# Patient Record
Sex: Male | Born: 1955 | Race: Black or African American | Hispanic: No | Marital: Married | Smoking: Never smoker
Health system: Southern US, Community
[De-identification: ages and names within clinical notes are randomized; demographics above are authoritative.]

## PROBLEM LIST (undated history)

## (undated) DIAGNOSIS — Z9289 Personal history of other medical treatment: Secondary | ICD-10-CM

## (undated) DIAGNOSIS — E785 Hyperlipidemia, unspecified: Secondary | ICD-10-CM

## (undated) DIAGNOSIS — K219 Gastro-esophageal reflux disease without esophagitis: Secondary | ICD-10-CM

## (undated) DIAGNOSIS — R9431 Abnormal electrocardiogram [ECG] [EKG]: Secondary | ICD-10-CM

## (undated) DIAGNOSIS — T7840XA Allergy, unspecified, initial encounter: Secondary | ICD-10-CM

## (undated) DIAGNOSIS — I10 Essential (primary) hypertension: Secondary | ICD-10-CM

## (undated) DIAGNOSIS — M1990 Unspecified osteoarthritis, unspecified site: Secondary | ICD-10-CM

## (undated) HISTORY — DX: Hyperlipidemia, unspecified: E78.5

## (undated) HISTORY — PX: WISDOM TOOTH EXTRACTION: SHX21

## (undated) HISTORY — DX: Essential (primary) hypertension: I10

## (undated) HISTORY — DX: Unspecified osteoarthritis, unspecified site: M19.90

## (undated) HISTORY — DX: Abnormal electrocardiogram (ECG) (EKG): R94.31

## (undated) HISTORY — DX: Allergy, unspecified, initial encounter: T78.40XA

## (undated) HISTORY — PX: NO PAST SURGERIES: SHX2092

## (undated) HISTORY — DX: Personal history of other medical treatment: Z92.89

## (undated) HISTORY — DX: Gastro-esophageal reflux disease without esophagitis: K21.9

---

## 2006-10-23 ENCOUNTER — Emergency Department (HOSPITAL_COMMUNITY): Admission: EM | Admit: 2006-10-23 | Discharge: 2006-10-23 | Payer: Self-pay | Admitting: Emergency Medicine

## 2012-06-16 ENCOUNTER — Other Ambulatory Visit: Payer: Self-pay | Admitting: Nephrology

## 2012-06-20 ENCOUNTER — Ambulatory Visit
Admission: RE | Admit: 2012-06-20 | Discharge: 2012-06-20 | Disposition: A | Discharge status: Home / Self Care | Payer: BC Managed Care – PPO | Source: Ambulatory Visit | Attending: Nephrology | Admitting: Nephrology

## 2012-06-28 ENCOUNTER — Ambulatory Visit (INDEPENDENT_AMBULATORY_CARE_PROVIDER_SITE_OTHER): Payer: BC Managed Care – PPO | Admitting: Family Medicine

## 2012-06-28 VITALS — BP 121/74 | HR 85 | Temp 98.3°F | Resp 16 | Ht 69.58 in | Wt 235.2 lb

## 2012-06-28 DIAGNOSIS — H60399 Other infective otitis externa, unspecified ear: Secondary | ICD-10-CM

## 2012-06-28 DIAGNOSIS — R471 Dysarthria and anarthria: Secondary | ICD-10-CM

## 2012-06-28 DIAGNOSIS — K140 Glossitis: Secondary | ICD-10-CM

## 2012-06-28 DIAGNOSIS — H6090 Unspecified otitis externa, unspecified ear: Secondary | ICD-10-CM

## 2012-06-28 LAB — POCT CBC
MCH, POC: 28.5 pg (ref 27–31.2)
MCV: 92.1 fL (ref 80–97)
MID (cbc): 0.4 (ref 0–0.9)
Platelet Count, POC: 220 10*3/uL (ref 142–424)
RBC: 4.88 M/uL (ref 4.69–6.13)
WBC: 4.9 10*3/uL (ref 4.6–10.2)

## 2012-06-28 MED ORDER — VALACYCLOVIR HCL 1 G PO TABS: 1000.0000 mg | ORAL_TABLET | Freq: Three times a day (TID) | ORAL | Status: DC

## 2012-06-28 MED ORDER — NEOMYCIN-POLYMYXIN-HC 3.5-10000-1 OT SOLN: 3.0000 [drp] | Freq: Four times a day (QID) | OTIC | Status: DC

## 2012-06-28 NOTE — Progress Notes (Signed)
Subjective:    Patient ID: Wesley Lawson, male    DOB: [birth year], 57 y.o.   MRN: [id number] Chief Complaint  Patient presents with  . Edema    x 5 days edema  tongue  left sided     HPI  On fri - 5d prev - pt noticed that left side of tongue was swollen and sore, feels like tongue won't move right and mouth dry.  Difficult to describe - swelling mild, pain from radiating front to base of left side of tongue.  Hard to get all of words out.  Left lower face does feel different but feels like it is stemming from tongue.  Did try something from energy about a wk ago once. occasionally takes ginsing.  No other new meds or supplements. Is taking lisinopril - pill looked different but he checked with both his doctor and his pharmacist and the supplier had just changed.  Did miss a few days of it while he was confirming that it was the right pill about 2 wks ago Sees Dr. Arletha Grippe at [facility] and Dr. Lowell Guitar - nephrology as was getting some renal failure from HTN.     Had dental appt a few mos ago. Did have a little cough/congestion recently.  Past Medical History  Diagnosis Date  . Hypertension   . Chest pain   . GERD (gastroesophageal reflux disease)   . Dyspnea   . Dyslipidemia   . Abnormal EKG    Current Outpatient Prescriptions on File Prior to Visit  Medication Sig Dispense Refill  . LISINOPRIL PO Take by mouth.      . Multiple Vitamin (MULTI-VITAMIN PO) Take by mouth.       No Known Allergies   Review of Systems  Constitutional: Positive for fatigue. Negative for fever, chills, diaphoresis, activity change, appetite change and unexpected weight change.  HENT: Positive for ear pain, congestion, rhinorrhea, mouth sores, dental problem and voice change. Negative for sore throat, sneezing, drooling, neck pain, neck stiffness, postnasal drip, sinus pressure and ear discharge.   Respiratory: Positive for cough. Negative for choking, chest tightness, shortness of breath, wheezing and stridor.     Skin: Negative for color change, pallor and rash.  Neurological: Positive for speech difficulty and numbness.  Psychiatric/Behavioral: Negative for behavioral problems, confusion and decreased concentration.      BP 121/74  Pulse 85  Temp 98.3 F (36.8 C) (Oral)  Resp 16  Ht 5' 9.58" (1.767 m)  Wt 235 lb 3.2 oz (106.686 kg)  BMI 34.16 kg/m2  SpO2 98% Objective:   Physical Exam  Constitutional: He is oriented to person, place, and time. He appears well-developed and well-nourished. No distress.  HENT:  Head: Normocephalic and atraumatic. No trismus in the jaw.  Right Ear: Tympanic membrane, external ear and ear canal normal.  Left Ear: Tympanic membrane, external ear and ear canal normal.  Nose: Nose normal.  Mouth/Throat: Uvula is midline, oropharynx is clear and moist and mucous membranes are normal. Mucous membranes are not pale and not dry. Oral lesions present. Abnormal dentition. No dental abscesses or uvula swelling. No oropharyngeal exudate, posterior oropharyngeal edema, posterior oropharyngeal erythema or tonsillar abscesses.       Bilateral canals with thick white purulent exudate diffusely along walls. No erythema or tenderness.  On left lateral side of tongue, small pinpoint white tender ulcer. No other abnormality seen in tongue, gums, cheek.  Eyes: Conjunctivae normal are normal. No scleral icterus.  Neck: Normal range of motion.  Neck supple. No thyromegaly present.  Cardiovascular: Normal rate, regular rhythm, normal heart sounds and intact distal pulses.   Pulmonary/Chest: Effort normal and breath sounds normal. No respiratory distress.  Abdominal: Soft.  Musculoskeletal: He exhibits no edema.  Lymphadenopathy:    He has no cervical adenopathy.  Neurological: He is alert and oriented to person, place, and time. No cranial nerve deficit or sensory deficit. He exhibits normal muscle tone.  Skin: Skin is warm and dry. He is not diaphoretic. No erythema.   Psychiatric: He has a normal mood and affect. His behavior is normal.      Results for orders placed in visit on 06/28/12  POCT CBC      Component Value Range   WBC 4.9  4.6 - 10.2 K/uL   Lymph, poc 2.1  0.6 - 3.4   POC LYMPH PERCENT 42.0  10 - 50 %L   MID (cbc) 0.4  0 - 0.9   POC MID % 707 (*) 0 - 12 %M   POC Granulocyte 2.5  2 - 6.9   Granulocyte percent 50.3  37 - 80 %G   RBC 4.88  4.69 - 6.13 M/uL   Hemoglobin 13.9 (*) 14.1 - 18.1 g/dL   HCT, POC 16.1  09.6 - 53.7 %   MCV 92.1  80 - 97 fL   MCH, POC 28.5  27 - 31.2 pg   MCHC 31.0 (*) 31.8 - 35.4 g/dL   RDW, POC 04.5     Platelet Count, POC 220  142 - 424 K/uL   MPV 10.5  0 - 99.8 fL  POCT SEDIMENTATION RATE      Component Value Range   POCT SED RATE 12  0 - 22 mm/hr    Assessment & Plan:   1. Tongue ulceration  POCT CBC, POCT SEDIMENTATION RATE, valACYclovir (VALTREX) 1000 MG tablet - unsure of cause - will try emperic antiviral in dosing for bell's palsy or shingles.  If no improvement or worsens, consider further testing - hiv, cmp, tsh, crp, cons lyme disease or further viral testing.  Rec pt start a vitamin B complex supp though MCV not elev as would suspect in pernicious anemia/b12 def. If still having any sxs in 1 wk - f/u for further eval and ENT referral.  Cons dental eval.  2. Dysarthria    3. Otitis externa  neomycin-polymyxin-hydrocortisone (CORTISPORIN) otic solution

## 2012-06-28 NOTE — Patient Instructions (Addendum)
Start a vitamin B complex supp. If things worsen at all or you develop further symptoms or haven't resolved after you finish the valtrex make sure you return to clinic.

## 2012-07-31 ENCOUNTER — Emergency Department (HOSPITAL_COMMUNITY): Payer: BC Managed Care – PPO

## 2012-07-31 ENCOUNTER — Encounter (HOSPITAL_COMMUNITY): Payer: Self-pay | Admitting: *Deleted

## 2012-07-31 ENCOUNTER — Emergency Department (HOSPITAL_COMMUNITY)
Admission: EM | Admit: 2012-07-31 | Discharge: 2012-07-31 | Disposition: A | Discharge status: Home / Self Care | Payer: BC Managed Care – PPO | Attending: Emergency Medicine | Admitting: Emergency Medicine

## 2012-07-31 DIAGNOSIS — R42 Dizziness and giddiness: Secondary | ICD-10-CM

## 2012-07-31 DIAGNOSIS — I10 Essential (primary) hypertension: Secondary | ICD-10-CM | POA: Insufficient documentation

## 2012-07-31 DIAGNOSIS — Z8719 Personal history of other diseases of the digestive system: Secondary | ICD-10-CM | POA: Insufficient documentation

## 2012-07-31 DIAGNOSIS — Z8639 Personal history of other endocrine, nutritional and metabolic disease: Secondary | ICD-10-CM | POA: Insufficient documentation

## 2012-07-31 DIAGNOSIS — Z79899 Other long term (current) drug therapy: Secondary | ICD-10-CM | POA: Insufficient documentation

## 2012-07-31 DIAGNOSIS — Z862 Personal history of diseases of the blood and blood-forming organs and certain disorders involving the immune mechanism: Secondary | ICD-10-CM | POA: Insufficient documentation

## 2012-07-31 LAB — CBC WITH DIFFERENTIAL/PLATELET
Eosinophils Absolute: 0.1 10*3/uL (ref 0.0–0.7)
Eosinophils Relative: 1 % (ref 0–5)
Hemoglobin: 14.8 g/dL (ref 13.0–17.0)
Lymphs Abs: 1.3 10*3/uL (ref 0.7–4.0)
MCH: 29.7 pg (ref 26.0–34.0)
MCV: 89 fL (ref 78.0–100.0)
Monocytes Absolute: 0.5 10*3/uL (ref 0.1–1.0)
Monocytes Relative: 5 % (ref 3–12)
RBC: 4.98 MIL/uL (ref 4.22–5.81)

## 2012-07-31 LAB — COMPREHENSIVE METABOLIC PANEL
BUN: 15 mg/dL (ref 6–23)
Calcium: 9.2 mg/dL (ref 8.4–10.5)
GFR calc Af Amer: 76 mL/min — ABNORMAL LOW (ref >90)
Glucose, Bld: 183 mg/dL — ABNORMAL HIGH (ref 70–99)
Total Protein: 7.9 g/dL (ref 6.0–8.3)

## 2012-07-31 LAB — TROPONIN I: Troponin I: <0.3 ng/mL (ref <0.30)

## 2012-07-31 MED ORDER — MECLIZINE HCL 25 MG PO TABS: 25.0000 mg | ORAL_TABLET | Freq: Three times a day (TID) | ORAL | Status: DC | PRN

## 2012-07-31 MED ORDER — MECLIZINE HCL 25 MG PO TABS
25.0000 mg | ORAL_TABLET | Freq: Once | ORAL | Status: AC
  Administered 2012-07-31: 25 mg via ORAL
  Filled 2012-07-31: qty 1

## 2012-07-31 NOTE — ED Notes (Signed)
Pt states the last time he eat was 730 pm

## 2012-07-31 NOTE — ED Notes (Addendum)
Per EMS: pt coming from home with c/o n/v, dizziness since midnight. Per EMS pt has output of close to a liter of emsis. Upon EMS arrival pt was cool and clammy, positive for nystagmus, dizzy. No neuro deficits. Pt is A&Ox4, respirations equal and unlabored, skin warm and dry. Pt given 8mg  of Zofran en route by EMS

## 2012-07-31 NOTE — ED Provider Notes (Signed)
History     CSN: [id number]  Arrival date & time 07/31/12  0137   First MD Initiated Contact with Patient 07/31/12 0211      Chief Complaint  Patient presents with  . Nausea  . Emesis    (Consider location/radiation/quality/duration/timing/severity/associated sxs/prior treatment) HPI Comments: Patient was here visiting a family member who had a stroke.  While here he started with dizziness (spinning sensation) and n/v, but no diarrhea.  The dizziness is worse with movement and position, relieved with rest.  No headache, fever, or chills.  No other ill contacts.    Patient is a 57 y.o. male presenting with vomiting. The history is provided by the patient.  Emesis Severity:  Moderate Duration:  4 hours Timing:  Constant Quality:  Stomach contents Progression:  Worsening Chronicity:  New Recent urination:  Normal   Past Medical History  Diagnosis Date  . Hypertension   . Chest pain   . GERD (gastroesophageal reflux disease)   . Dyspnea   . Dyslipidemia   . Abnormal EKG     History reviewed. No pertinent past surgical history.  Family History  Problem Relation Age of Onset  . Diabetes Mother   . Stroke Mother   . Diabetes Sister     History  Substance Use Topics  . Smoking status: Never Smoker   . Smokeless tobacco: Not on file  . Alcohol Use: 0.6 oz/week    1 Cans of beer per week      Review of Systems  Gastrointestinal: Positive for vomiting.  All other systems reviewed and are negative.    Allergies  Review of patient's allergies indicates no known allergies.  Home Medications   Current Outpatient Rx  Name  Route  Sig  Dispense  Refill  . LISINOPRIL PO   Oral   Take by mouth.         . Multiple Vitamin (MULTI-VITAMIN PO)   Oral   Take by mouth.         . neomycin-polymyxin-hydrocortisone (CORTISPORIN) otic solution   Both Ears   Place 3 drops into both ears 4 (four) times daily.   10 mL   0   . valACYclovir (VALTREX) 1000 MG  tablet   Oral   Take 1 tablet (1,000 mg total) by mouth 3 (three) times daily.   21 tablet   0     BP 175/99  Pulse 83  Temp(Src) 97.9 F (36.6 C) (Oral)  Resp 19  SpO2 98%  Physical Exam  Nursing note and vitals reviewed. Constitutional: He is oriented to person, place, and time. He appears well-developed and well-nourished. No distress.  HENT:  Head: Normocephalic and atraumatic.  Mouth/Throat: Oropharynx is clear and moist.  Eyes: EOM are normal. Pupils are equal, round, and reactive to light.  Neck: Normal range of motion. Neck supple.  Cardiovascular: Normal rate and regular rhythm.   No murmur heard. Pulmonary/Chest: Effort normal and breath sounds normal. No respiratory distress. He has no wheezes.  Abdominal: Soft. Bowel sounds are normal. He exhibits no distension. There is no tenderness.  Musculoskeletal: Normal range of motion. He exhibits no edema.  Lymphadenopathy:    He has no cervical adenopathy.  Neurological: He is alert and oriented to person, place, and time. No cranial nerve deficit. He exhibits normal muscle tone. Coordination normal.  Skin: Skin is warm and dry. He is not diaphoretic.    ED Course  Procedures (including critical care time)  Labs Reviewed  CBC WITH  DIFFERENTIAL  COMPREHENSIVE METABOLIC PANEL  TROPONIN I   No results found.   No diagnosis found.   Date: 07/31/2012  Rate: 81  Rhythm: normal sinus rhythm  QRS Axis: normal  Intervals: normal  ST/T Wave abnormalities: nonspecific T wave changes  Conduction Disutrbances:none  Narrative Interpretation:   Old EKG Reviewed: unchanged    MDM  The patient presents with symptoms that are consistent with a peripheral vertigo.  The workup does not reveal a stroke, cardiac etiology, or other laboratory abnormality that would explain this.  He is feeling better with meclizine and I believe is appropriate for discharge.  To return prn.        Geoffery Lyons, MD 07/31/12 [phone]

## 2013-04-01 ENCOUNTER — Ambulatory Visit (INDEPENDENT_AMBULATORY_CARE_PROVIDER_SITE_OTHER): Payer: BC Managed Care – PPO | Admitting: Family Medicine

## 2013-04-01 VITALS — BP 128/80 | HR 72 | Temp 98.3°F | Resp 14 | Ht 71.0 in | Wt 233.0 lb

## 2013-04-01 DIAGNOSIS — J029 Acute pharyngitis, unspecified: Secondary | ICD-10-CM

## 2013-04-01 DIAGNOSIS — Z23 Encounter for immunization: Secondary | ICD-10-CM

## 2013-04-01 DIAGNOSIS — R079 Chest pain, unspecified: Secondary | ICD-10-CM

## 2013-04-01 NOTE — Patient Instructions (Signed)
Viral Pharyngitis Viral pharyngitis is a viral infection that produces redness, pain, and swelling (inflammation) of the throat. It can spread from person to person (contagious). CAUSES Viral pharyngitis is caused by inhaling a large amount of certain germs called viruses. Many different viruses cause viral pharyngitis. SYMPTOMS Symptoms of viral pharyngitis include:  Sore throat.  Tiredness.  Stuffy nose.  Low-grade fever.  Congestion.  Cough. TREATMENT Treatment includes rest, drinking plenty of fluids, and the use of over-the-counter medication (approved by your caregiver). HOME CARE INSTRUCTIONS   Drink enough fluids to keep your urine clear or pale yellow.  Eat soft, cold foods such as ice cream, frozen ice pops, or gelatin dessert.  Gargle with warm salt water (1 tsp salt per 1 qt of water).  If over age 7, throat lozenges may be used safely.  Only take over-the-counter or prescription medicines for pain, discomfort, or fever as directed by your caregiver. Do not take aspirin. To help prevent spreading viral pharyngitis to others, avoid:  Mouth-to-mouth contact with others.  Sharing utensils for eating and drinking.  Coughing around others. SEEK MEDICAL CARE IF:   You are better in a few days, then become worse.  You have a fever or pain not helped by pain medicines.  There are any other changes that concern you. Document Released: 03/10/2005 Document Revised: 08/23/2011 Document Reviewed: 08/06/2010 Exit[employer] Patient Information 2014 [employer].  

## 2013-04-01 NOTE — Progress Notes (Signed)
  Subjective:    Patient ID: Wesley Lawson, male    DOB: 1956/03/18, 57 y.o.   MRN: [id number]  HPI Throat scratchy for a couple of days. Started hurting more this morning, from ear to throat. Mild headache. No nasal drainage, no cough. No SOB. No fever, no chills. Slept ok last night. Upper chest started aching this morning. Not too bad, but getting worse. Occasionally has acid indigestion.  No nausea, no vomiting, no diarrhea.  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   Sees [employer], at [place]. Was there recently and had labs done. Has regular follow up. Had negative stress test in past.  Review of Systems  Constitutional: Positive for fatigue. Negative for fever and chills.       Fatigued last couple of months.   HENT: Positive for ear pain. Negative for congestion, postnasal drip, rhinorrhea and sinus pressure.        Pain at base of left ear into throat.  Respiratory: Negative for shortness of breath.   Cardiovascular: Positive for chest pain. Negative for palpitations and leg swelling.  Gastrointestinal: Negative for nausea, vomiting, diarrhea and constipation.  Allergic/Immunologic: Negative for environmental allergies.  Neurological: Negative for headaches.       Objective:   Physical Exam  Nursing note and vitals reviewed. Constitutional: He appears well-developed and well-nourished. No distress.  HENT:  Right Ear: Tympanic membrane, external ear and ear canal normal.  Left Ear: Tympanic membrane and ear canal normal.  Nose: Nose normal.  Mouth/Throat: Uvula is midline and mucous membranes are normal. Posterior oropharyngeal erythema present. No oropharyngeal exudate, posterior oropharyngeal edema or tonsillar abscesses.  Cardiovascular: Normal rate, regular rhythm and normal heart sounds.   Pulmonary/Chest: Effort normal and breath sounds normal. He exhibits tenderness.  Slightly tender over anterior left sternal border.  Abdominal: Soft.  Bowel sounds are normal.  Skin: He is not diaphoretic.   Results for orders placed in visit on 04/01/13  POCT RAPID STREP A (OFFICE)      Result Value Range   Rapid Strep A Screen Negative  Negative       Assessment & Plan:  Acute pharyngitis - Plan: POCT rapid strep A, Culture, Group A Strep  Chest pain - Plan: EKG 12-Lead  Need for prophylactic vaccination and inoculation against influenza - Plan: Flu Vaccine QUAD 36+ mos IM  Gave written and verbal information about pharyngitis. Will notify of culture results. Return if worsening symptoms, fever, SP, SOB.

## 2013-04-04 LAB — CULTURE, GROUP A STREP: Organism ID, Bacteria: NORMAL

## 2013-08-30 NOTE — Progress Notes (Signed)
History and physical exams obtained with [facility].  Labs and EKG reviewed.  A/P:  1.  Acute pharyngitis: New.  Send throat culture; supportive care with rest, fluids, Ibuprofen or Tylenol. Consistent with viral syndrome.  2.  Chest pain:  New.  Consistent with chest wall strain; pain is reproducible with palpation.  3.  Abnormal EKG: unchanged; EKG abnormal yet unchanged from previous tracing.  No exertional component to chest pain at this time.  Recommend follow-up with PCP if persists.  4.  S/p flu vaccine.

## 2014-02-07 ENCOUNTER — Ambulatory Visit (INDEPENDENT_AMBULATORY_CARE_PROVIDER_SITE_OTHER): Payer: BC Managed Care – PPO | Admitting: Sports Medicine

## 2014-02-07 ENCOUNTER — Encounter: Payer: Self-pay | Admitting: Sports Medicine

## 2014-02-07 VITALS — BP 138/79 | HR 75 | Ht 70.0 in | Wt 230.0 lb

## 2014-02-07 DIAGNOSIS — R209 Unspecified disturbances of skin sensation: Secondary | ICD-10-CM

## 2014-02-07 DIAGNOSIS — R200 Anesthesia of skin: Secondary | ICD-10-CM | POA: Insufficient documentation

## 2014-02-07 DIAGNOSIS — M79644 Pain in right finger(s): Secondary | ICD-10-CM

## 2014-02-07 DIAGNOSIS — M79609 Pain in unspecified limb: Secondary | ICD-10-CM

## 2014-02-07 MED ORDER — PREDNISONE (PAK) 10 MG PO TABS: ORAL_TABLET | ORAL | Status: DC
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
## 2014-02-07 NOTE — Assessment & Plan Note (Signed)
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 Again given this is acute onset and the decision to treat his inflammation in his left arm with prednisone we'll reassess if this intervention resolve some of his inflammation over the next couple weeks. The patient follows up we'll also address his hip pain.

## 2014-02-07 NOTE — Assessment & Plan Note (Signed)
At this time I cannot differentiate the exact location that initiated patient's neuropathic pain in his hand with numbness. No clear signs of nerve impingement at the carpal tunnel or partial condyle. There is possibility that he could have some impingement in his neck given his history of facet narrowing at the C6-C7 region seen on previous x-rays. At this time because of the relatively acute onset of symptoms I recommended treating this with a prednisone dose pack to reduce inflammation and see if this will calm down his discomfort in his hand. The patient consistently has symptoms of numbness and tingling those are resolving after another 3-4 weeks will recommend referring him for a EMG to classify if the neuropathic type of pain is coming from his neck versus more peripheral location

## 2014-02-21 ENCOUNTER — Encounter: Payer: Self-pay | Admitting: Sports Medicine

## 2014-02-21 ENCOUNTER — Ambulatory Visit
Admission: RE | Admit: 2014-02-21 | Discharge: 2014-02-21 | Disposition: A | Discharge status: Home / Self Care | Payer: BC Managed Care – PPO | Source: Ambulatory Visit | Attending: Sports Medicine | Admitting: Sports Medicine

## 2014-02-21 ENCOUNTER — Ambulatory Visit (INDEPENDENT_AMBULATORY_CARE_PROVIDER_SITE_OTHER): Payer: BC Managed Care – PPO | Admitting: Sports Medicine

## 2014-02-21 VITALS — BP 116/76 | HR 81 | Ht 70.0 in | Wt 230.0 lb

## 2014-02-21 DIAGNOSIS — M79644 Pain in right finger(s): Secondary | ICD-10-CM

## 2014-02-21 DIAGNOSIS — M25559 Pain in unspecified hip: Secondary | ICD-10-CM | POA: Diagnosis not present

## 2014-02-21 DIAGNOSIS — M79609 Pain in unspecified limb: Secondary | ICD-10-CM

## 2014-02-21 DIAGNOSIS — M25551 Pain in right hip: Secondary | ICD-10-CM | POA: Insufficient documentation

## 2014-02-21 NOTE — Progress Notes (Signed)
  Wesley Lawson - 58 y.o. male MRN [id number]  Date of birth: [birth year]  SUBJECTIVE:  Including CC & ROS.  Patient is a 58 year old African American male presents for f/u with multiple complaints including, right thumb pain, right lateral hip and and resolution of the left hand numbness and tingling.  Left hand numbness: The patient's last visit approximately 3 weeks ago he is treated with a course of oral prednisone to help with his multi-areas of inflammation. Patient reports resolution of the neuropathic type pain in his left hand with no longer having sensations of numbness. Not had any problems with weakness and having no pain at night.  Right thumb:Patient reports some improvement in the aching sensation of his right thumb at the first MCP joint from the prednisone treatment. However the pain has returned and continues to have intermittent throbbing. I suspected at his last visit this is from first MCP joint arthritis.Again denies any weakness. Denies any numbness or tingling of his hand.  Right lateral hip pain: Patient localizes persistent discomfort over the right lateral hip essentially between the iliac crest down into his greater trochanter along the abductor and external rotating muscle bellies. He has no groin pain no low back pain. Again he found some clinical improvement from the prednisone but no complete resolution of his symptoms. Denies any numbness tingling in the legs or radiation of this pain.  All 3 of these issues are slightly improved with taking Tylenol over-the-counter. Patient has not engaging in any bracing, physical therapy activities for relief.   ROS: Review of systems otherwise negative except for information present in HPI  HISTORY: Past Medical, Surgical, Social, and Family History Reviewed & Updated per EMR. Pertinent Historical Findings include: History of HTN, no tobacco abuse  DATA REVIEWED: Was able to review cervical spine x-rays from 2008 it did show some  mild disc space narrowing and endplate spurring. As well as slight encroachment upon bilateral cervical nuclear foramen at C6/C7 secondary to spurring and cervical thoracic scoliosis.  PHYSICAL EXAM:  VS: BP:116/76 mmHg  HR:81bpm  TEMP: ( )  RESP:   HT:5\' 10"  (177.8 cm)   WT:230 lb (104.327 kg)  BMI:33.1 General: well nourished Skin of UE: warm; dry, no rashes, lesions, ecchymosis or erythema. Vascular: radial pulses 2+ bilaterally HAND EXAM:       Observation: no palmar or dorsal hand edema, no swelling, no erythema, no bruising Palpation:Right hand patient had some mild tenderness to palpation over the first MCP joint, and tenderness at the metacarpal-trapezium joint. No pain over the tendinous junction of the first or second extensor tunnels.    ROM: normal ROM in supination and pronation, elbow extension and flexion    Strength: Normal 5/5 strength with extension/ flexion, pronation and supination.     No pain 5/5 strength of each digit in flexion and extension   Special test: stable medial and lateral collateral ligaments of the thumb on the right hand.   HIP EXAM:  Palpation:  TTP pain over the iliac crest,  With tenderness of the lateral greater trochanter and bursa No PSIS tenderness or SI joint pain ROM: Normal Hip motion in flexion, extension, internal and external rotation Muscle strength:Patient has significant muscle weakness of his gluteus medius and gluteus maximus. Along with decreased strength with external rotation of the hip. Approximately 4/5 compared to the left. Positive Trendelenburg's on the right  ASSESSMENT & PLAN: See problem based charting & AVS for pt instructions.

## 2014-02-21 NOTE — Assessment & Plan Note (Signed)
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 Recommend proceeding with right hand x-rays to evaluate severity of his arthritis. After a waiting his x-rays will call to discuss treatment options of possible intra-articular injection

## 2014-02-21 NOTE — Assessment & Plan Note (Signed)
Patient has a persistent lateral hip discomfort despite aggressive anti-inflammatory treatment. Suspect this is related to muscle weakness of the hip external rotators and abductors. Recommend a referral to physical therapy to work on hip strengthening and pelvic stabilization

## 2014-02-26 ENCOUNTER — Telehealth: Payer: Self-pay | Admitting: Sports Medicine

## 2014-02-26 NOTE — Telephone Encounter (Signed)
Patient voicemail to call back for his x-ray results on Thursday afternoon or Friday morning when I'm back in office

## 2014-02-28 ENCOUNTER — Other Ambulatory Visit: Payer: Self-pay | Admitting: *Deleted

## 2014-02-28 DIAGNOSIS — M25551 Pain in right hip: Secondary | ICD-10-CM

## 2014-02-28 MED ORDER — MELOXICAM 15 MG PO TABS: 15.0000 mg | ORAL_TABLET | Freq: Every day | ORAL | Status: DC | PRN

## 2014-02-28 NOTE — Addendum Note (Signed)
Addended by: Louie Casa on: 02/28/2014 12:39 PM   Modules accepted: Orders

## 2014-02-28 NOTE — Telephone Encounter (Signed)
Spoke with patient over the phone concerning his right common x-ray results revealing only mild degenerative changes. Discussed options of using a thumb spica brace to calm down the inflammation and prevent excessive motion in this joint to see this reduces his inflammation. Patient was agreeable this plan and also asked for a potential anti-inflammatory medicine he can take daily. Told him I would send a prescription for Mobic.  Concerning his low back pain patient is onset of this appointment for physical therapy at this time. We'll coordinate with my office to make sure the patient has a appointment set up in the near future.

## 2014-03-07 ENCOUNTER — Ambulatory Visit: Payer: BC Managed Care – PPO | Attending: Family Medicine

## 2014-03-07 DIAGNOSIS — R269 Unspecified abnormalities of gait and mobility: Secondary | ICD-10-CM | POA: Diagnosis not present

## 2014-03-07 DIAGNOSIS — M25559 Pain in unspecified hip: Secondary | ICD-10-CM | POA: Diagnosis not present

## 2014-03-07 DIAGNOSIS — IMO0001 Reserved for inherently not codable concepts without codable children: Secondary | ICD-10-CM | POA: Diagnosis not present

## 2014-03-19 ENCOUNTER — Encounter: Payer: BC Managed Care – PPO | Admitting: Physical Therapy

## 2014-03-21 ENCOUNTER — Ambulatory Visit: Payer: BC Managed Care – PPO | Attending: Family Medicine | Admitting: Physical Therapy

## 2014-03-21 DIAGNOSIS — Z5189 Encounter for other specified aftercare: Secondary | ICD-10-CM | POA: Insufficient documentation

## 2014-03-21 DIAGNOSIS — R269 Unspecified abnormalities of gait and mobility: Secondary | ICD-10-CM | POA: Diagnosis not present

## 2014-03-21 DIAGNOSIS — M25551 Pain in right hip: Secondary | ICD-10-CM | POA: Diagnosis not present

## 2014-03-25 ENCOUNTER — Ambulatory Visit: Payer: BC Managed Care – PPO | Admitting: Physical Therapy

## 2015-02-17 ENCOUNTER — Emergency Department (INDEPENDENT_AMBULATORY_CARE_PROVIDER_SITE_OTHER)
Admission: EM | Admit: 2015-02-17 | Discharge: 2015-02-17 | Disposition: A | Discharge status: Home / Self Care | Payer: BC Managed Care – PPO | Source: Home / Self Care | Attending: Family Medicine | Admitting: Family Medicine

## 2015-02-17 ENCOUNTER — Encounter (HOSPITAL_COMMUNITY): Payer: Self-pay | Admitting: Emergency Medicine

## 2015-02-17 DIAGNOSIS — H6123 Impacted cerumen, bilateral: Secondary | ICD-10-CM | POA: Diagnosis not present

## 2015-02-17 NOTE — ED Notes (Signed)
C/o cerumen impaction bilateral otc ear med used as tx

## 2015-02-17 NOTE — Discharge Instructions (Signed)
Your ears were cleaned today. Please use a one-to-one mixture of hydrogen peroxide and water in the future to help break up the ear wax in your ears. Please use ibuprofen 600 mg to 800 mg every 6-8 hours for additional pain relief.   Cerumen Impaction A cerumen impaction is when the wax in your ear forms a plug. This plug usually causes reduced hearing. Sometimes it also causes an earache or dizziness. Removing a cerumen impaction can be difficult and painful. The wax sticks to the ear canal. The canal is sensitive and bleeds easily. If you try to remove a heavy wax buildup with a cotton tipped swab, you may push it in further. Irrigation with water, suction, and small ear curettes may be used to clear out the wax. If the impaction is fixed to the skin in the ear canal, ear drops may be needed for a few days to loosen the wax. People who build up a lot of wax frequently can use ear wax removal products available in your local drugstore. SEEK MEDICAL CARE IF:  You develop an earache, increased hearing loss, or marked dizziness. Document Released: 07/08/2004 Document Revised: 08/23/2011 Document Reviewed: 08/28/2009 [facility] Patient Information 2015 [place], [place]. This information is not intended to replace advice given to you by your health care provider. Make sure you discuss any questions you have with your health care provider.

## 2015-02-17 NOTE — ED Provider Notes (Signed)
CSN: [id number]     Arrival date & time 02/17/15  1837 History   First MD Initiated Contact with Patient 02/17/15 1959     Chief Complaint  Patient presents with  . Cerumen Impaction   (Consider location/radiation/quality/duration/timing/severity/associated sxs/prior Treatment) HPI muffled sounds in ears. Patient with intermittent cerumen impaction history. States that this is been getting worse over the last 5 days. No pain. No discharge. Bilateral. Problem is constant and getting worse. Q-tips without improvement. Denies neck stiffness, fevers, headache, nausea, vomiting, dizziness, lightheadedness, LOC, URI symptoms, sore throat, chest congestion or cough.    Past Medical History  Diagnosis Date  . Hypertension   . Chest pain   . GERD (gastroesophageal reflux disease)   . Dyspnea   . Dyslipidemia   . Abnormal EKG    History reviewed. No pertinent past surgical history. Family History  Problem Relation Age of Onset  . Diabetes Mother   . Stroke Mother   . Diabetes Sister    Social History  Substance Use Topics  . Smoking status: Never Smoker   . Smokeless tobacco: None  . Alcohol Use: 0.6 oz/week    1 Cans of beer per week    Review of Systems Per HPI with all other pertinent systems negative.   Allergies  Review of patient's allergies indicates no known allergies.  Home Medications   Prior to Admission medications   Medication Sig Start Date End Date Taking? Authorizing Provider  Cyanocobalamin (VITAMIN B-12 PO) Take 1 tablet by mouth daily.    Historical Provider, MD  hydrochlorothiazide (HYDRODIURIL) 25 MG tablet Take 25 mg by mouth daily.    Historical Provider, MD  lisinopril (PRINIVIL,ZESTRIL) 20 MG tablet Take 20 mg by mouth daily.    Historical Provider, MD  meclizine (ANTIVERT) 25 MG tablet Take 1 tablet (25 mg total) by mouth 3 (three) times daily as needed. 07/31/12   Veryl Speak, MD  meloxicam (MOBIC) 15 MG tablet Take 1 tablet (15 mg total) by mouth  daily as needed for pain. 02/28/14   Deanna M Didiano, DO  Multiple Vitamin (MULTI-VITAMIN PO) Take by mouth.    Historical Provider, MD   Meds Ordered and Administered this Visit  Medications - No data to display  BP 131/83 mmHg  Pulse 86  Temp(Src) 98.5 F (36.9 C) (Oral)  Resp 16  SpO2 98% No data found.   Physical Exam Physical Exam  Constitutional: oriented to person, place, and time. appears well-developed and well-nourished. No distress.  HENT:  Bilateral cerumen impaction. Posterior irrigation TMs intact with external ear canal erythema and mild bleeding secondary to clean out no overt signs of infection. Head: Normocephalic and atraumatic.  Eyes: EOMI. PERRL.  Neck: Normal range of motion.  Cardiovascular: RRR, no m/r/g, 2+ distal pulses,  Pulmonary/Chest: Effort normal and breath sounds normal. No respiratory distress.  Abdominal: Soft. Bowel sounds are normal. NonTTP, no distension.  Musculoskeletal: Normal range of motion. Non ttp, no effusion.  Neurological: alert and oriented to person, place, and time.  Skin: Skin is warm. No rash noted. non diaphoretic.  Psychiatric: normal mood and affect. behavior is normal. Judgment and thought content normal.   ED Course  Procedures (including critical care time)  Labs Review Labs Reviewed - No data to display  Imaging Review No results found.   Visual Acuity Review  Right Eye Distance:   Left Eye Distance:   Bilateral Distance:    Right Eye Near:   Left Eye Near:  Bilateral Near:         MDM   1. Cerumen impaction, bilateral    Bilateral cerumen impaction cleared by irrigation. Motrin 600 800 mg 4 pain post clean out. No need for antibiotic systems point in time. Discussed future maintenance cleaning with peroxide and water as opposed to Q-tips.   Waldemar Dickens, MD 02/17/15 2008

## 2015-06-05 ENCOUNTER — Emergency Department (INDEPENDENT_AMBULATORY_CARE_PROVIDER_SITE_OTHER)
Admission: EM | Admit: 2015-06-05 | Discharge: 2015-06-05 | Disposition: A | Discharge status: Nursing Home (Includes ICF) | Payer: BC Managed Care – PPO | Source: Home / Self Care | Attending: Family Medicine | Admitting: Family Medicine

## 2015-06-05 ENCOUNTER — Encounter (HOSPITAL_COMMUNITY): Payer: Self-pay | Admitting: Emergency Medicine

## 2015-06-05 ENCOUNTER — Emergency Department (INDEPENDENT_AMBULATORY_CARE_PROVIDER_SITE_OTHER): Payer: BC Managed Care – PPO

## 2015-06-05 ENCOUNTER — Emergency Department (HOSPITAL_COMMUNITY)
Admission: EM | Admit: 2015-06-05 | Discharge: 2015-06-06 | Disposition: A | Discharge status: Home / Self Care | Payer: BC Managed Care – PPO | Attending: Emergency Medicine | Admitting: Emergency Medicine

## 2015-06-05 DIAGNOSIS — Z8639 Personal history of other endocrine, nutritional and metabolic disease: Secondary | ICD-10-CM | POA: Diagnosis not present

## 2015-06-05 DIAGNOSIS — Z79899 Other long term (current) drug therapy: Secondary | ICD-10-CM | POA: Insufficient documentation

## 2015-06-05 DIAGNOSIS — R000 Tachycardia, unspecified: Secondary | ICD-10-CM | POA: Diagnosis not present

## 2015-06-05 DIAGNOSIS — R079 Chest pain, unspecified: Secondary | ICD-10-CM

## 2015-06-05 DIAGNOSIS — J069 Acute upper respiratory infection, unspecified: Secondary | ICD-10-CM | POA: Diagnosis not present

## 2015-06-05 DIAGNOSIS — I10 Essential (primary) hypertension: Secondary | ICD-10-CM | POA: Diagnosis not present

## 2015-06-05 DIAGNOSIS — Z8719 Personal history of other diseases of the digestive system: Secondary | ICD-10-CM | POA: Diagnosis not present

## 2015-06-05 DIAGNOSIS — R0600 Dyspnea, unspecified: Secondary | ICD-10-CM

## 2015-06-05 DIAGNOSIS — R0602 Shortness of breath: Secondary | ICD-10-CM | POA: Diagnosis present

## 2015-06-05 LAB — CBC WITH DIFFERENTIAL/PLATELET
BASOS ABS: 0 10*3/uL (ref 0.0–0.1)
BASOS PCT: 0 %
EOS ABS: 0.1 10*3/uL (ref 0.0–0.7)
Eosinophils Relative: 1 %
HEMATOCRIT: 41.8 % (ref 39.0–52.0)
HEMOGLOBIN: 13.5 g/dL (ref 13.0–17.0)
Lymphocytes Relative: 16 %
Lymphs Abs: 1.6 10*3/uL (ref 0.7–4.0)
MCH: 29.5 pg (ref 26.0–34.0)
MCHC: 32.3 g/dL (ref 30.0–36.0)
MCV: 91.3 fL (ref 78.0–100.0)
MONO ABS: 1 10*3/uL (ref 0.1–1.0)
Monocytes Relative: 10 %
NEUTROS ABS: 7.8 10*3/uL — AB (ref 1.7–7.7)
NEUTROS PCT: 73 %
Platelets: 191 10*3/uL (ref 150–400)
RBC: 4.58 MIL/uL (ref 4.22–5.81)
RDW: 14.1 % (ref 11.5–15.5)
WBC: 10.5 10*3/uL (ref 4.0–10.5)

## 2015-06-05 LAB — BASIC METABOLIC PANEL
ANION GAP: 10 (ref 5–15)
BUN: 9 mg/dL (ref 6–20)
CALCIUM: 9.3 mg/dL (ref 8.9–10.3)
CO2: 29 mmol/L (ref 22–32)
CREATININE: 1.39 mg/dL — AB (ref 0.61–1.24)
Chloride: 99 mmol/L — ABNORMAL LOW (ref 101–111)
GFR, EST NON AFRICAN AMERICAN: 54 mL/min — AB (ref >60)
Glucose, Bld: 108 mg/dL — ABNORMAL HIGH (ref 65–99)
Potassium: 3.7 mmol/L (ref 3.5–5.1)
Sodium: 138 mmol/L (ref 135–145)

## 2015-06-05 LAB — I-STAT TROPONIN, ED
TROPONIN I, POC: 0 ng/mL (ref 0.00–0.08)
Troponin i, poc: 0.01 ng/mL (ref 0.00–0.08)

## 2015-06-05 LAB — TROPONIN I

## 2015-06-05 MED ORDER — ACETAMINOPHEN 325 MG PO TABS
650.0000 mg | ORAL_TABLET | Freq: Once | ORAL | Status: AC
  Administered 2015-06-05: 650 mg via ORAL
  Filled 2015-06-05: qty 2

## 2015-06-05 MED ORDER — SODIUM CHLORIDE 0.9 % IV SOLN
Freq: Once | INTRAVENOUS | Status: AC
  Administered 2015-06-05: 19:00:00 via INTRAVENOUS

## 2015-06-05 NOTE — ED Notes (Signed)
No carelink available

## 2015-06-05 NOTE — ED Provider Notes (Signed)
CSN: [id number]     Arrival date & time 06/05/15  1855 History   First MD Initiated Contact with Patient 06/05/15 1909     Chief Complaint  Patient presents with  . Shortness of Breath     (Consider location/radiation/quality/duration/timing/severity/associated sxs/prior Treatment) HPI  59 year old male presents with a chief complaint of shortness of breath. This started around 1 PM while he was at work. Seems to be only noticeable on exertion. He has had a cough for last 3 days and feels like his chest is congested. He was at urgent care earlier and sent here for evaluation. Urgent care notes that he states he was having chest pain and chest pressure. He tells me there is no pain or pressure, is no heaviness, but only that he feels congested in his left chest. Has been having subjective fevers with chills. Has also been feeling he's getting laryngitis with a sore throat and hoarseness.  Past Medical History  Diagnosis Date  . Hypertension   . Chest pain   . GERD (gastroesophageal reflux disease)   . Dyspnea   . Dyslipidemia   . Abnormal EKG    History reviewed. No pertinent past surgical history. Family History  Problem Relation Age of Onset  . Diabetes Mother   . Stroke Mother   . Diabetes Sister    Social History  Substance Use Topics  . Smoking status: Never Smoker   . Smokeless tobacco: None  . Alcohol Use: 0.6 oz/week    1 Cans of beer per week    Review of Systems  Constitutional: Positive for chills. Negative for fever.  HENT: Positive for congestion and voice change.   Respiratory: Positive for cough and shortness of breath.   Cardiovascular: Negative for chest pain and leg swelling.  All other systems reviewed and are negative.     Allergies  Review of patient's allergies indicates no known allergies.  Home Medications   Prior to Admission medications   Medication Sig Start Date End Date Taking? Authorizing Provider  Cyanocobalamin (VITAMIN B-12 PO)  Take 1 tablet by mouth daily.    Historical Provider, MD  hydrochlorothiazide (HYDRODIURIL) 25 MG tablet Take 25 mg by mouth daily.    Historical Provider, MD  lisinopril (PRINIVIL,ZESTRIL) 20 MG tablet Take 20 mg by mouth daily.    Historical Provider, MD  meclizine (ANTIVERT) 25 MG tablet Take 1 tablet (25 mg total) by mouth 3 (three) times daily as needed. 07/31/12   Veryl Speak, MD  meloxicam (MOBIC) 15 MG tablet Take 1 tablet (15 mg total) by mouth daily as needed for pain. 02/28/14   Deanna M Didiano, DO  Multiple Vitamin (MULTI-VITAMIN PO) Take by mouth.    Historical Provider, MD   There were no vitals taken for this visit. Physical Exam  Constitutional: He is oriented to person, place, and time. He appears well-developed and well-nourished.  HENT:  Head: Normocephalic and atraumatic.  Right Ear: External ear normal.  Left Ear: External ear normal.  Nose: Nose normal.  Eyes: Right eye exhibits no discharge. Left eye exhibits no discharge.  Neck: Neck supple.  Cardiovascular: Normal rate, regular rhythm, normal heart sounds and intact distal pulses.   Pulmonary/Chest: Effort normal and breath sounds normal. No stridor.  Abdominal: Soft. He exhibits no distension. There is no tenderness.  Musculoskeletal: He exhibits no edema.  Neurological: He is alert and oriented to person, place, and time.  Skin: Skin is warm and dry.  Nursing note and vitals reviewed.  ED Course  Procedures (including critical care time) Labs Review Labs Reviewed  BASIC METABOLIC PANEL - Abnormal; Notable for the following:    Chloride 99 (*)    Glucose, Bld 108 (*)    Creatinine, Ser 1.39 (*)    GFR calc non Af Amer 54 (*)    All other components within normal limits  CBC WITH DIFFERENTIAL/PLATELET - Abnormal; Notable for the following:    Neutro Abs 7.8 (*)    All other components within normal limits  TROPONIN I  I-STAT TROPOININ, ED  Randolm Idol, ED    Imaging Review Dg Chest 2  View  06/05/2015  CLINICAL DATA:  Chest pain.  Shortness of breath. EXAM: CHEST  2 VIEW COMPARISON:  10/23/2006 FINDINGS: The heart size and mediastinal contours are within normal limits. Both lungs are clear. The visualized skeletal structures are unremarkable. IMPRESSION: No active cardiopulmonary disease. Electronically Signed   By: Markus Daft M.D.   On: 06/05/2015 17:48   I have personally reviewed and evaluated these images and lab results as part of my medical decision-making.   EKG Interpretation   Date/Time:  Thursday June 05 2015 19:03:48 EST Ventricular Rate:  86 PR Interval:  131 QRS Duration: 87 QT Interval:  351 QTC Calculation: 420 R Axis:   53 Text Interpretation:  Sinus rhythm Borderline T wave abnormalities no  significant change since 2014 Confirmed by Amiayah Giebel  MD, [place] ([id number]) on  06/05/2015 7:10:49 PM      MDM   Final diagnoses:  Upper respiratory infection    Patient symptoms are most consistent with an upper respiratory infection. There is no chest pain despite prior urgent care what patient is telling me. Patient appears well here, no increased work of breathing. Has 2 negative troponins, very low suspicion for ACS. Symptoms are not consistent with PE. Patient did have a very brief episode of tachycardia, difficult to tell this was atrial flutter or sinus. I discussed this with patient, he did not have any symptoms during this period he will need to follow-up with his PCP for further workup and possible Holter monitoring. Discussed strict return precautions.  Sherwood Gambler, MD 06/06/15 [phone]

## 2015-06-05 NOTE — ED Notes (Signed)
Per EMS, pt from urgent care with c/o shortness of breath and weakness while at work. Pt sent from urgent care. VSS. BP- 140/90, O2- 98 RA

## 2015-06-05 NOTE — Discharge Instructions (Signed)
Your Heart Rate transiently elevated while you were in the ER. It is unclear if this is from an arrythmia or that your heart rate just spike up. Follow up closely with your regular doctor. If you develop palpitations return to the ER immediately.

## 2015-06-05 NOTE — ED Notes (Signed)
Patient complains of laryngitis, chest pain with coughing described as "hurting" pain. Reports phlegm in throat.  Reports having difficulty catching breath after performing routine activities of daily life.  Complains of headache.  Reports sniffles earlier this week.  Denies ear pain, denies fever.  Patient did not get a flu shot this year.

## 2015-06-05 NOTE — ED Notes (Signed)
Notified [facility] 

## 2015-06-05 NOTE — ED Provider Notes (Signed)
CSN: [id number]     Arrival date & time 06/05/15  1518 History   First MD Initiated Contact with Patient 06/05/15 1555     Chief Complaint  Patient presents with  . Chest Pain  . Cough   (Consider location/radiation/quality/duration/timing/severity/associated sxs/prior Treatment) HPI Comments: 59 year old male complaining of one-day history of shortness of breath and mid chest pain that feels like pressure that started today. Symptoms earlier today began with exertion. They spontaneously resolved with rest after approximately 10 minutes. In addition he has had PND, laryngitis and a minor scratchy throat for 2-3 days.  His past medical history significant for hypertension, dyslipidemia and abnormal EKG. He is not a smoker nor has he ever been.   Past Medical History  Diagnosis Date  . Hypertension   . Chest pain   . GERD (gastroesophageal reflux disease)   . Dyspnea   . Dyslipidemia   . Abnormal EKG    History reviewed. No pertinent past surgical history. Family History  Problem Relation Age of Onset  . Diabetes Mother   . Stroke Mother   . Diabetes Sister    Social History  Substance Use Topics  . Smoking status: Never Smoker   . Smokeless tobacco: None  . Alcohol Use: 0.6 oz/week    1 Cans of beer per week    Review of Systems  Constitutional: Positive for fever, activity change and fatigue.  HENT: Positive for congestion and postnasal drip. Negative for ear pain and trouble swallowing.   Eyes: Negative.   Respiratory: Positive for shortness of breath. Negative for cough.   Cardiovascular: Positive for chest pain. Negative for palpitations.  Gastrointestinal: Negative.   Genitourinary: Negative.   Skin: Negative for color change and rash.  Neurological: Negative.     Allergies  Review of patient's allergies indicates no known allergies.  Home Medications   Prior to Admission medications   Medication Sig Start Date End Date Taking? Authorizing Provider   Cyanocobalamin (VITAMIN B-12 PO) Take 1 tablet by mouth daily.    Historical Provider, MD  hydrochlorothiazide (HYDRODIURIL) 25 MG tablet Take 25 mg by mouth daily.    Historical Provider, MD  lisinopril (PRINIVIL,ZESTRIL) 20 MG tablet Take 20 mg by mouth daily.    Historical Provider, MD  meclizine (ANTIVERT) 25 MG tablet Take 1 tablet (25 mg total) by mouth 3 (three) times daily as needed. 07/31/12   Veryl Speak, MD  meloxicam (MOBIC) 15 MG tablet Take 1 tablet (15 mg total) by mouth daily as needed for pain. 02/28/14   Deanna M Didiano, DO  Multiple Vitamin (MULTI-VITAMIN PO) Take by mouth.    Historical Provider, MD   Meds Ordered and Administered this Visit   Medications  0.9 %  sodium chloride infusion (not administered)    BP 131/76 mmHg  Pulse 93  Temp(Src) 99.4 F (37.4 C) (Oral)  Resp 18  SpO2 96% No data found.   Physical Exam  Constitutional: He is oriented to person, place, and time. He appears well-developed and well-nourished. No distress.  HENT:  Mouth/Throat: No oropharyngeal exudate.  Bilateral TMs are normal Oropharynx with minor erythema and clear PND.  Eyes: Conjunctivae and EOM are normal.  Neck: Normal range of motion. Neck supple.  Cardiovascular: Normal rate, regular rhythm and normal heart sounds.   Pulmonary/Chest: Effort normal and breath sounds normal. No respiratory distress. He has no wheezes. He has no rales. He exhibits no tenderness.  Musculoskeletal: Normal range of motion. He exhibits no edema.  Lymphadenopathy:  He has no cervical adenopathy.  Neurological: He is alert and oriented to person, place, and time. No cranial nerve deficit. He exhibits normal muscle tone.  Skin: Skin is warm and dry.  Psychiatric: He has a normal mood and affect. His behavior is normal.  Nursing note and vitals reviewed.   ED Course  Procedures (including critical care time)  Labs Review Labs Reviewed - No data to display  Imaging Review Dg Chest 2  View  06/05/2015  CLINICAL DATA:  Chest pain.  Shortness of breath. EXAM: CHEST  2 VIEW COMPARISON:  10/23/2006 FINDINGS: The heart size and mediastinal contours are within normal limits. Both lungs are clear. The visualized skeletal structures are unremarkable. IMPRESSION: No active cardiopulmonary disease. Electronically Signed   By: Markus Daft M.D.   On: 06/05/2015 17:48   ED ECG REPORT   Date: 06/05/2015  Rate: *81 Rhythm: normal sinus rhythm  QRS Axis: normal  Intervals: normal  ST/T Wave abnormalities: nonspecific T wave changes  Conduction Disutrbances:none  Narrative Interpretation:   Old EKG Reviewed: changes noted  Some improvement in previous T wave inversions. Now most leads with t-waves positive exept V4 and V6 I have personally reviewed the EKG tracing and agree with the computerized printout as noted.   Visual Acuity Review  Right Eye Distance:   Left Eye Distance:   Bilateral Distance:    Right Eye Near:   Left Eye Near:    Bilateral Near:         MDM   1. Chest pain, unspecified chest pain type   2. Dyspnea   3. URI (upper respiratory infection)    Transferring this is 59 year old male to the ED via care Link for evaluation of chest pain and dyspnea. He has URI symptoms however, upon exertion he had an episode of dyspnea with chest pressure that lasted for approximately 10 minutes and improved with rest. He will be evaluated for the specific symptoms. Chest x-ray shows no active cardiopulmonary disease and EKG   essentially unchanged from previous, no signs of active ischemia. Vital signs are stable.   Janne Napoleon, NP 06/05/15 [phone]

## 2015-06-06 ENCOUNTER — Other Ambulatory Visit: Payer: Self-pay

## 2015-06-06 DIAGNOSIS — J069 Acute upper respiratory infection, unspecified: Secondary | ICD-10-CM | POA: Diagnosis not present

## 2015-07-10 ENCOUNTER — Ambulatory Visit (INDEPENDENT_AMBULATORY_CARE_PROVIDER_SITE_OTHER): Payer: BC Managed Care – PPO | Admitting: Podiatry

## 2015-07-10 ENCOUNTER — Ambulatory Visit (INDEPENDENT_AMBULATORY_CARE_PROVIDER_SITE_OTHER): Payer: BC Managed Care – PPO

## 2015-07-10 ENCOUNTER — Encounter: Payer: Self-pay | Admitting: Podiatry

## 2015-07-10 ENCOUNTER — Ambulatory Visit: Payer: BC Managed Care – PPO

## 2015-07-10 VITALS — BP 133/74 | HR 85 | Resp 16 | Ht 70.0 in | Wt 225.0 lb

## 2015-07-10 DIAGNOSIS — M79672 Pain in left foot: Secondary | ICD-10-CM

## 2015-07-10 DIAGNOSIS — M722 Plantar fascial fibromatosis: Secondary | ICD-10-CM

## 2015-07-10 DIAGNOSIS — M779 Enthesopathy, unspecified: Secondary | ICD-10-CM

## 2015-07-10 MED ORDER — DICLOFENAC SODIUM 75 MG PO TBEC: 75.0000 mg | DELAYED_RELEASE_TABLET | Freq: Two times a day (BID) | ORAL | Status: DC

## 2015-07-10 MED ORDER — TRIAMCINOLONE ACETONIDE 10 MG/ML IJ SUSP
10.0000 mg | Freq: Once | INTRAMUSCULAR | Status: AC
  Administered 2015-07-10: 10 mg

## 2015-07-10 NOTE — Patient Instructions (Signed)

## 2015-07-10 NOTE — Progress Notes (Signed)
Subjective:     Patient ID: Wesley Lawson, male   DOB: 08-13-1955, 60 y.o.   MRN: [id number]  HPI patient states I've had a lot of pain in the bottom my left heel and is still painful fell on my right foot and I wanted to make sure I didn't break anything. Heels been hurting for several months and does have a history of this condition   Review of Systems  All other systems reviewed and are negative.      Objective:   Physical Exam  Constitutional: He is oriented to person, place, and time.  Cardiovascular: Intact distal pulses.   Musculoskeletal: Normal range of motion.  Neurological: He is oriented to person, place, and time.  Skin: Skin is warm.  Nursing note and vitals reviewed.  neurovascular status found to be intact muscle strength adequate range of motion within normal limits with patient found to have moderate depression of the arch bilateral with inflammation and fluid around the medial fascial band left at the insertional point tendon into the calcaneus. Right foot lateral there is some contusion secondary to previous trauma and patient's noted to have good digital perfusion and is well oriented 3     Assessment:     Inflammatory fasciitis left with fluid buildup and traumatized right forefoot with possibility for bone injury    Plan:     H&P and x-rays of both feet reviewed with patient. Today I injected the left plantar fascia 3 mg Kenalog 5 mg Xylocaine and applied fascial brace with instructions and gave instructions on physical therapy and supportive shoe gear usage. For the right heel utilize ice therapy and wider-type shoe gear and will be seen back in 2 weeks

## 2015-07-10 NOTE — Progress Notes (Signed)
   Subjective:    Patient ID: Wesley Lawson, male    DOB: 11/19/1955, 59 y.o.   MRN: [id number]  HPI  Patient presents with foot pain. Left foot; heel; x2 months; Right foot-pt stated, "steel pole fell on foot and hit lateral side in Dec. 2016".  Review of Systems  All other systems reviewed and are negative.      Objective:   Physical Exam        Assessment & Plan:

## 2015-07-24 ENCOUNTER — Ambulatory Visit (INDEPENDENT_AMBULATORY_CARE_PROVIDER_SITE_OTHER): Payer: BC Managed Care – PPO | Admitting: Podiatry

## 2015-07-24 ENCOUNTER — Encounter: Payer: Self-pay | Admitting: Podiatry

## 2015-07-24 VITALS — BP 126/74 | HR 92 | Resp 16

## 2015-07-24 DIAGNOSIS — M722 Plantar fascial fibromatosis: Secondary | ICD-10-CM

## 2015-07-24 NOTE — Progress Notes (Signed)
Subjective:     Patient ID: Wesley Lawson, male   DOB: [birth year], 60 y.o.   MRN: [id number]  HPI patient presents stating my pain is improving but still present and I have old orthotics that are no longer holding my arch up properly   Review of Systems     Objective:   Physical Exam Neurovascular status intact muscle strength was adequate with patient having plantar heel pain that's improved but is still present left over right    Assessment:     Plantar fasciitis bilateral that's improved but present    Plan:     Advised this patient on physical therapy and we will probably get him into orthotics but I do see him back in 6 weeks we'll bring his orthotics with him. He will utilize over-the-counter insoles until bed and we will make a decision at that time

## 2015-09-04 ENCOUNTER — Ambulatory Visit (INDEPENDENT_AMBULATORY_CARE_PROVIDER_SITE_OTHER): Payer: BC Managed Care – PPO | Admitting: Podiatry

## 2015-09-04 ENCOUNTER — Encounter: Payer: Self-pay | Admitting: Podiatry

## 2015-09-04 VITALS — BP 120/72 | HR 71 | Resp 16

## 2015-09-04 DIAGNOSIS — M779 Enthesopathy, unspecified: Secondary | ICD-10-CM | POA: Diagnosis not present

## 2015-09-04 DIAGNOSIS — M722 Plantar fascial fibromatosis: Secondary | ICD-10-CM

## 2015-09-04 MED ORDER — TRIAMCINOLONE ACETONIDE 10 MG/ML IJ SUSP
10.0000 mg | Freq: Once | INTRAMUSCULAR | Status: AC
  Administered 2015-09-04: 10 mg

## 2015-09-04 NOTE — Progress Notes (Signed)
Subjective:     Patient ID: Wesley Lawson, male   DOB: Feb 23, 1956, 60 y.o.   MRN: [id number]  HPI patient presents stating I'm doing pretty well but I'm getting need orthotics for the long run and I have pain still in the bottom of my heel when I walk   Review of Systems     Objective:   Physical Exam Neurovascular status intact with significant flatfoot deformity noted bilateral and exquisite discomfort medial fascial band left    Assessment:     Acute plantar fasciitis still present with long-term structural deformity of the feet    Plan:     H&P condition reviewed and reinjected the plantar fascia 3 mg Kenalog 5 mg Xylocaine. I then did molds of the feet and I'm sending this off for new orthotics utilizing a Berkley type device due to the structure of the patient's feet. Gave instructions on physical therapy

## 2015-09-30 ENCOUNTER — Ambulatory Visit: Payer: BC Managed Care – PPO | Admitting: *Deleted

## 2015-09-30 DIAGNOSIS — M722 Plantar fascial fibromatosis: Secondary | ICD-10-CM

## 2015-09-30 NOTE — Progress Notes (Signed)
Patient ID: Wesley Lawson, male   DOB: 1956/04/07, 61 y.o.   MRN: [id number] Patient presents for orthotic pick up.  Verbal and written break in and wear instructions given.  Patient will follow up in 4 weeks if symptoms worsen or fail to improve.

## 2015-09-30 NOTE — Patient Instructions (Signed)

## 2015-10-13 ENCOUNTER — Encounter: Payer: Self-pay | Admitting: Medical

## 2015-10-13 ENCOUNTER — Ambulatory Visit
Admission: RE | Admit: 2015-10-13 | Discharge: 2015-10-13 | Disposition: A | Discharge status: Home / Self Care | Payer: BC Managed Care – PPO | Source: Ambulatory Visit | Attending: Medical | Admitting: Medical

## 2015-10-13 ENCOUNTER — Ambulatory Visit (INDEPENDENT_AMBULATORY_CARE_PROVIDER_SITE_OTHER): Payer: BC Managed Care – PPO | Admitting: Medical

## 2015-10-13 VITALS — BP 148/98 | HR 76 | Wt 228.0 lb

## 2015-10-13 DIAGNOSIS — M545 Low back pain, unspecified: Secondary | ICD-10-CM

## 2015-10-13 DIAGNOSIS — M24659 Ankylosis, unspecified hip: Secondary | ICD-10-CM

## 2015-10-13 DIAGNOSIS — M25551 Pain in right hip: Secondary | ICD-10-CM

## 2015-10-13 DIAGNOSIS — E669 Obesity, unspecified: Secondary | ICD-10-CM

## 2015-10-13 DIAGNOSIS — I10 Essential (primary) hypertension: Secondary | ICD-10-CM | POA: Diagnosis not present

## 2015-10-13 DIAGNOSIS — M25659 Stiffness of unspecified hip, not elsewhere classified: Secondary | ICD-10-CM

## 2015-10-13 NOTE — Progress Notes (Signed)
Subjective: Chief Complaint  Patient presents with  . New Patient (Initial Visit)  . Back Pain    lower back pain, and comes around his hip and down his leg only on rt side. has been off and on for awhile but the last two weeks it has just gotten worse and worse. causing him to have trouble sleeping. taking ibuprofen. works part time at [employer], and full time at post office.    Here as a new patient for c/o low back pain, hip pain.  Was seeing [facility], then Dr. Roswell Miners prior.  Has had ongoing pains, but worse in last few weeks.   Retired from [employer] in 2005.  Starts in right low back, radiates down right leg to the foot.  Is a throbbing pain.  Feels numb at times in the right leg, not weakness.  Has pain in low back when he bends or straightens the right leg.  Worked for many years at [place], and has lifted heavy things in the past regularly.   No specific fall, trauma or accident recently.  No difficulty with urinating or bowel movements, no blood in urine or stool.   No recent fever.  No abdominal pain.   Works at [employer] currently.  He notes that his BP is usually normal.  Compliant with BP medication.  No other aggravating or relieving factors. No other complaint.  Past Medical History  Diagnosis Date  . Hypertension   . Chest pain   . GERD (gastroesophageal reflux disease)   . Dyspnea   . Dyslipidemia   . Abnormal EKG    ROS as in subjective   Objective: BP 148/98 mmHg  Pulse 76  Wt 228 lb (103.42 kg)  General appearance: alert, no distress, WD/WN, obese AA male Heart: RRR, normal S1, S2, no murmurs Lungs: CTA bilaterally, no wheezes, rhonchi, or rales Abdomen: +bs, soft, non tender, non distended, no masses, no hepatomegaly, no splenomegaly Back: mild right lumbar tenderness, mild pain with ROM which is somewhat reduced.  No scoliosis Musculoskeletal: bilat hips with very decreased ROM, pain in right hip with ROM.  Otherwise nontender, no swelling, no obvious  deformity Extremities: no edema, no cyanosis, no clubbing Pulses: 2+ symmetric, upper and lower extremities, normal cap refill Neurological: strength and DTRs normal of bilat legs, but + pain in right low back with right SLR at 40 degrees     Assessment: Encounter Diagnoses  Name Primary?  . Right-sided low back pain without sciatica Yes  . Right hip pain   . Decreased range of hip movement, unspecified laterality   . Essential hypertension   . Obesity      Plan: discussed possible causes.   Will send for xrays of L spine and hips.   Begin Voltaren he has at home written by podiatry.    HTN - c/t same medication.  Reviewed labs from ED visit in 05/2015 for chest pain.  Return soon for physical.

## 2015-10-13 NOTE — Progress Notes (Signed)
Patient called and I gave him imaging results.  He is not interested in physical therapy as he did that 2 years ago and didn't really help.  He will take the Aleve.  He said he is still in a lot of pain and wants to know what to do about work tomorrow.  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  Please call pt [phone]

## 2015-10-14 ENCOUNTER — Telehealth: Payer: Self-pay | Admitting: *Deleted

## 2015-10-14 NOTE — Telephone Encounter (Signed)
Patient called tried diclofenac and aleve and not helping. Having pain in hips and hamstring area and having a hard time walking. Would like to know if you could rx him something for the pain.

## 2015-10-14 NOTE — Telephone Encounter (Signed)
This one is yours

## 2015-10-14 NOTE — Telephone Encounter (Signed)
Pt called back and stated that the pain is getting worse and now he has a numb sensation. I am sending back to [facility] due to the fact that Sanda Klein has already left.

## 2015-10-15 ENCOUNTER — Other Ambulatory Visit: Payer: Self-pay | Admitting: Medical

## 2015-10-15 MED ORDER — PREDNISONE 10 MG (21) PO TBPK: ORAL_TABLET | ORAL | Status: DC

## 2015-10-15 MED ORDER — TRAMADOL HCL 50 MG PO TABS: 50.0000 mg | ORAL_TABLET | Freq: Four times a day (QID) | ORAL | Status: DC | PRN

## 2015-10-15 NOTE — Telephone Encounter (Signed)
How soon can is the ortho appt? I sent a steroid taper to have him use in the event of nerve root impingement.  Use the steroid instead of the anti-inflammatory pill and can pickup Ultram for pain.  Rx written

## 2015-10-15 NOTE — Telephone Encounter (Signed)
Phoned in medication. Pt has an appt with dr Ninfa Linden 12/04/15.

## 2015-11-12 ENCOUNTER — Telehealth: Payer: Self-pay

## 2015-11-12 NOTE — Telephone Encounter (Signed)
Pt called the office with concerns about uncontrollable hiccups- he had injection in his hip yesterday at ortho and woke up this am with hiccups since 6 am. Pt's sentences are broken up because of the hiccups while talking with pt. He states that he has tried everything- drinking water, holding his breath, distraction, etc. He spoke with a nurse friend who said that he could get Nexium to try for possible acid reflux. Pt is at pharmacy now getting Nexium, but wanted to know if there was any alternatives to his hiccups. Please advise. Victorino December

## 2015-11-12 NOTE — Telephone Encounter (Signed)
He can certainly try Nexium OTC first, but come in for OV if not improving in the next 48 hours.  There are other prescription things that may help.

## 2015-11-13 NOTE — Telephone Encounter (Signed)
LMTCB

## 2015-11-17 NOTE — Telephone Encounter (Signed)
Spoke with pt- he is doing better and hiccups have resolved. Victorino December

## 2015-11-27 ENCOUNTER — Encounter: Payer: Self-pay | Admitting: Medical

## 2015-11-27 ENCOUNTER — Ambulatory Visit (INDEPENDENT_AMBULATORY_CARE_PROVIDER_SITE_OTHER): Payer: BC Managed Care – PPO | Admitting: Medical

## 2015-11-27 ENCOUNTER — Encounter: Payer: Self-pay | Admitting: Internal Medicine

## 2015-11-27 VITALS — BP 112/82 | HR 91 | Wt 223.0 lb

## 2015-11-27 DIAGNOSIS — I10 Essential (primary) hypertension: Secondary | ICD-10-CM

## 2015-11-27 DIAGNOSIS — M25571 Pain in right ankle and joints of right foot: Secondary | ICD-10-CM | POA: Diagnosis not present

## 2015-11-27 DIAGNOSIS — M25551 Pain in right hip: Secondary | ICD-10-CM

## 2015-11-27 DIAGNOSIS — E669 Obesity, unspecified: Secondary | ICD-10-CM

## 2015-11-27 DIAGNOSIS — M545 Low back pain, unspecified: Secondary | ICD-10-CM

## 2015-11-27 DIAGNOSIS — M24659 Ankylosis, unspecified hip: Secondary | ICD-10-CM

## 2015-11-27 DIAGNOSIS — Z1211 Encounter for screening for malignant neoplasm of colon: Secondary | ICD-10-CM | POA: Diagnosis not present

## 2015-11-27 DIAGNOSIS — R351 Nocturia: Secondary | ICD-10-CM | POA: Diagnosis not present

## 2015-11-27 DIAGNOSIS — M25659 Stiffness of unspecified hip, not elsewhere classified: Secondary | ICD-10-CM

## 2015-11-27 LAB — CBC WITH DIFFERENTIAL/PLATELET
BASOS ABS: 0 {cells}/uL (ref 0–200)
Basophils Relative: 0 %
Eosinophils Absolute: 126 cells/uL (ref 15–500)
Eosinophils Relative: 2 %
HEMATOCRIT: 46.7 % (ref 38.5–50.0)
HEMOGLOBIN: 15.6 g/dL (ref 13.2–17.1)
LYMPHS PCT: 33 %
Lymphs Abs: 2079 cells/uL (ref 850–3900)
MCH: 29.7 pg (ref 27.0–33.0)
MCHC: 33.4 g/dL (ref 32.0–36.0)
MCV: 89 fL (ref 80.0–100.0)
MONO ABS: 693 {cells}/uL (ref 200–950)
MPV: 10.7 fL (ref 7.5–12.5)
Monocytes Relative: 11 %
NEUTROS PCT: 54 %
Neutro Abs: 3402 cells/uL (ref 1500–7800)
Platelets: 224 10*3/uL (ref 140–400)
RBC: 5.25 MIL/uL (ref 4.20–5.80)
RDW: 14.2 % (ref 11.0–15.0)
WBC: 6.3 10*3/uL (ref 4.0–10.5)

## 2015-11-27 LAB — POCT URINALYSIS DIPSTICK
Bilirubin, UA: NEGATIVE
Blood, UA: NEGATIVE
GLUCOSE UA: NEGATIVE
KETONES UA: NEGATIVE
Leukocytes, UA: NEGATIVE
Nitrite, UA: NEGATIVE
Protein, UA: NEGATIVE
UROBILINOGEN UA: NEGATIVE
pH, UA: 5

## 2015-11-27 MED ORDER — LISINOPRIL-HYDROCHLOROTHIAZIDE 20-25 MG PO TABS: 1.0000 | ORAL_TABLET | Freq: Every day | ORAL | Status: DC

## 2015-11-27 MED ORDER — TRAMADOL HCL 50 MG PO TABS: 50.0000 mg | ORAL_TABLET | Freq: Four times a day (QID) | ORAL | Status: DC | PRN

## 2015-11-27 NOTE — Progress Notes (Signed)
Subjective: Chief Complaint  Patient presents with  . ankle pain    rt ankle and starts at the ankle and goes up his leg. started 2 weeks ago. not able to sleep. gout?   Here for ankle pain.  I just recently started seeing him as a new patient back in 10/2015. Has had right ankle pain x 2 weeks, worse pain at night, like knife being stuck in ankle.     Had injection in right hip 2 weeks ago with orthopedist we referred him to.  No fall, no injury or trauma.  Can't sleep well due to the pain.   Having to get up several times a night to urinate recently.  Sometimes gets some discomfort with urination.   Had UTI in remote past.  Married, no concern for STD.    Drinks beer on weekends, 3-5 beers on Saturday, sometimes on Friday nights.  Does eat a lot of fried foods, but not a lot of sweets.    Wants referral for first colonoscopy   Past Medical History  Diagnosis Date  . Hypertension   . Chest pain   . GERD (gastroesophageal reflux disease)   . Dyspnea   . Dyslipidemia   . Abnormal EKG    History reviewed. No pertinent past surgical history.  ROS as in subjective   Objective: BP 112/82 mmHg  Pulse 91  Wt 223 lb (101.152 kg)  BP Readings from Last 3 Encounters:  11/27/15 112/82  10/13/15 148/98  09/04/15 120/72   Wt Readings from Last 3 Encounters:  11/27/15 223 lb (101.152 kg)  10/13/15 228 lb (103.42 kg)  07/10/15 225 lb (102.059 kg)   General appearance: alert, no distress, WD/WN, AA male Neck: supple, no lymphadenopathy, no thyromegaly, no masses, no bruits Heart: RRR, normal S1, S2, no murmurs Lungs: CTA bilaterally, no wheezes, rhonchi, or rales Abdomen: +bs, soft, non tender, non distended, no masses, no hepatomegaly, no splenomegaly Pulses: 1+ symmetric, upper and lower extremities, normal cap refill  Ext: no edema Right foot mild tenderness inferior to medial malleolus, otherwise foot nontender, somewhat flat arches, bilat, normal ROM of bilat feet toes and  ankles, No swelling, no other deformity DRE: anus normal tone, prostate mildly enlarged, no nodules   Assessment: Encounter Diagnoses  Name Primary?  . Right ankle pain Yes  . Decreased range of hip movement, unspecified laterality   . Essential hypertension   . Obesity   . Right-sided low back pain without sciatica   . Right hip pain   . Special screening for malignant neoplasms, colon   . Nocturia     Plan: Right ankle pain - possible OA vs gout vs other vs referred pain from plantar fascitis.  He sees podiatry.  Uric acid level today  Hip and back pain - recently referred to ortho by [place], had recent injection into right hip  HTN - BP normal today, labs today  Obesity - needs to lose weight, labs today  Referral to GI for colonoscopy  Nocturia - likely BPH related, labs today  F/u pending labs, referral

## 2015-11-27 NOTE — Addendum Note (Signed)
Addended by: Billie Lade on: 11/27/2015 09:33 AM   Modules accepted: Orders

## 2015-11-28 ENCOUNTER — Other Ambulatory Visit: Payer: Self-pay | Admitting: Medical

## 2015-11-28 LAB — COMPREHENSIVE METABOLIC PANEL
ALBUMIN: 4.9 g/dL (ref 3.6–5.1)
ALK PHOS: 66 U/L (ref 40–115)
ALT: 37 U/L (ref 9–46)
AST: 20 U/L (ref 10–35)
BUN: 21 mg/dL (ref 7–25)
CALCIUM: 9.7 mg/dL (ref 8.6–10.3)
CO2: 23 mmol/L (ref 20–31)
Chloride: 99 mmol/L (ref 98–110)
Creat: 1.33 mg/dL (ref 0.70–1.33)
GLUCOSE: 107 mg/dL — AB (ref 65–99)
POTASSIUM: 3.9 mmol/L (ref 3.5–5.3)
Sodium: 136 mmol/L (ref 135–146)
Total Bilirubin: 1.1 mg/dL (ref 0.2–1.2)
Total Protein: 7.8 g/dL (ref 6.1–8.1)

## 2015-11-28 LAB — HEMOGLOBIN A1C
Hgb A1c MFr Bld: 5.9 % — ABNORMAL HIGH (ref <5.7)
MEAN PLASMA GLUCOSE: 123 mg/dL

## 2015-11-28 LAB — LIPID PANEL
CHOL/HDL RATIO: 5 ratio (ref <=5.0)
Cholesterol: 224 mg/dL — ABNORMAL HIGH (ref 125–200)
HDL: 45 mg/dL (ref >=40)
LDL Cholesterol: 140 mg/dL — ABNORMAL HIGH (ref <130)
TRIGLYCERIDES: 195 mg/dL — AB (ref <150)
VLDL: 39 mg/dL — ABNORMAL HIGH (ref <30)

## 2015-11-28 LAB — PSA: PSA: 0.8 ng/mL (ref <=4.00)

## 2015-11-28 LAB — URIC ACID: Uric Acid, Serum: 6.9 mg/dL (ref 4.0–8.0)

## 2015-11-28 MED ORDER — TAMSULOSIN HCL 0.4 MG PO CAPS: 0.4000 mg | ORAL_CAPSULE | Freq: Every day | ORAL | Status: DC

## 2015-11-28 MED ORDER — ALLOPURINOL 100 MG PO TABS: 100.0000 mg | ORAL_TABLET | Freq: Every day | ORAL | Status: DC

## 2015-12-04 ENCOUNTER — Telehealth: Payer: Self-pay

## 2015-12-04 NOTE — Telephone Encounter (Signed)
FYI:  States that he took disability forms to Dr. Ninfa Linden and they stated that he needed to get his dates of treatment from [place]. Stated that they would be reaching out to [place] after reviewing the form with the provider. So we are pending a reach out from them to know exactly what they need but they will be the ones completing the disability forms

## 2015-12-07 NOTE — Telephone Encounter (Signed)
Send Dr. Trevor Mace nurse a fax showing dates of service here for the reason we referred him.   Make sure he has copy of our last note.  I assume when we refer to people not in Epic you are sending copies of last office note and any pertinent labs or imaging, correct?

## 2015-12-08 ENCOUNTER — Telehealth: Payer: Self-pay

## 2015-12-08 NOTE — Telephone Encounter (Signed)
Ortho or surgeon, not dermatologist.

## 2015-12-08 NOTE — Telephone Encounter (Signed)
Ortho or Psychologist, sport and exercise.

## 2015-12-08 NOTE — Telephone Encounter (Signed)
Is this Dr. Ninfa Linden at ortho or dermatology?

## 2015-12-08 NOTE — Telephone Encounter (Signed)
Dr. Jean Rosenthal, [place]

## 2015-12-08 NOTE — Telephone Encounter (Signed)
See msg

## 2015-12-09 ENCOUNTER — Telehealth: Payer: Self-pay

## 2015-12-09 NOTE — Telephone Encounter (Signed)
See msg

## 2015-12-09 NOTE — Telephone Encounter (Signed)
OV notes from 10/2015 sent to Dr. Trevor Mace nurse attention to his fax [phone] Annye Asa. Victorino December

## 2015-12-11 NOTE — Telephone Encounter (Signed)
error 

## 2016-01-13 ENCOUNTER — Ambulatory Visit (AMBULATORY_SURGERY_CENTER): Payer: Self-pay | Admitting: *Deleted

## 2016-01-13 ENCOUNTER — Encounter (INDEPENDENT_AMBULATORY_CARE_PROVIDER_SITE_OTHER): Payer: Self-pay

## 2016-01-13 VITALS — Ht 69.5 in | Wt 228.4 lb

## 2016-01-13 DIAGNOSIS — Z1211 Encounter for screening for malignant neoplasm of colon: Secondary | ICD-10-CM

## 2016-01-13 MED ORDER — SUPREP BOWEL PREP KIT 17.5-3.13-1.6 GM/177ML PO SOLN: 1.0000 | Freq: Once | ORAL | 0 refills | Status: AC

## 2016-01-13 NOTE — Progress Notes (Signed)
Patient denies any allergies to egg or soy products. Patient denies complications with anesthesia/sedation.  Patient denies oxygen use at home and denies diet medications. Emmi instructions for colonoscopy explained but patient denied.  Pamphlet given.  

## 2016-01-27 ENCOUNTER — Encounter: Payer: BC Managed Care – PPO | Admitting: Internal Medicine

## 2016-05-01 IMAGING — CR DG FINGER THUMB 2+V*R*
3 series · 3 of 3 positions shown · non-contrast
Comparison: None.

CLINICAL DATA: Pain and swelling of the right thumb.

EXAM:
RIGHT THUMB 2+V

[view not recorded (1 of 3)]
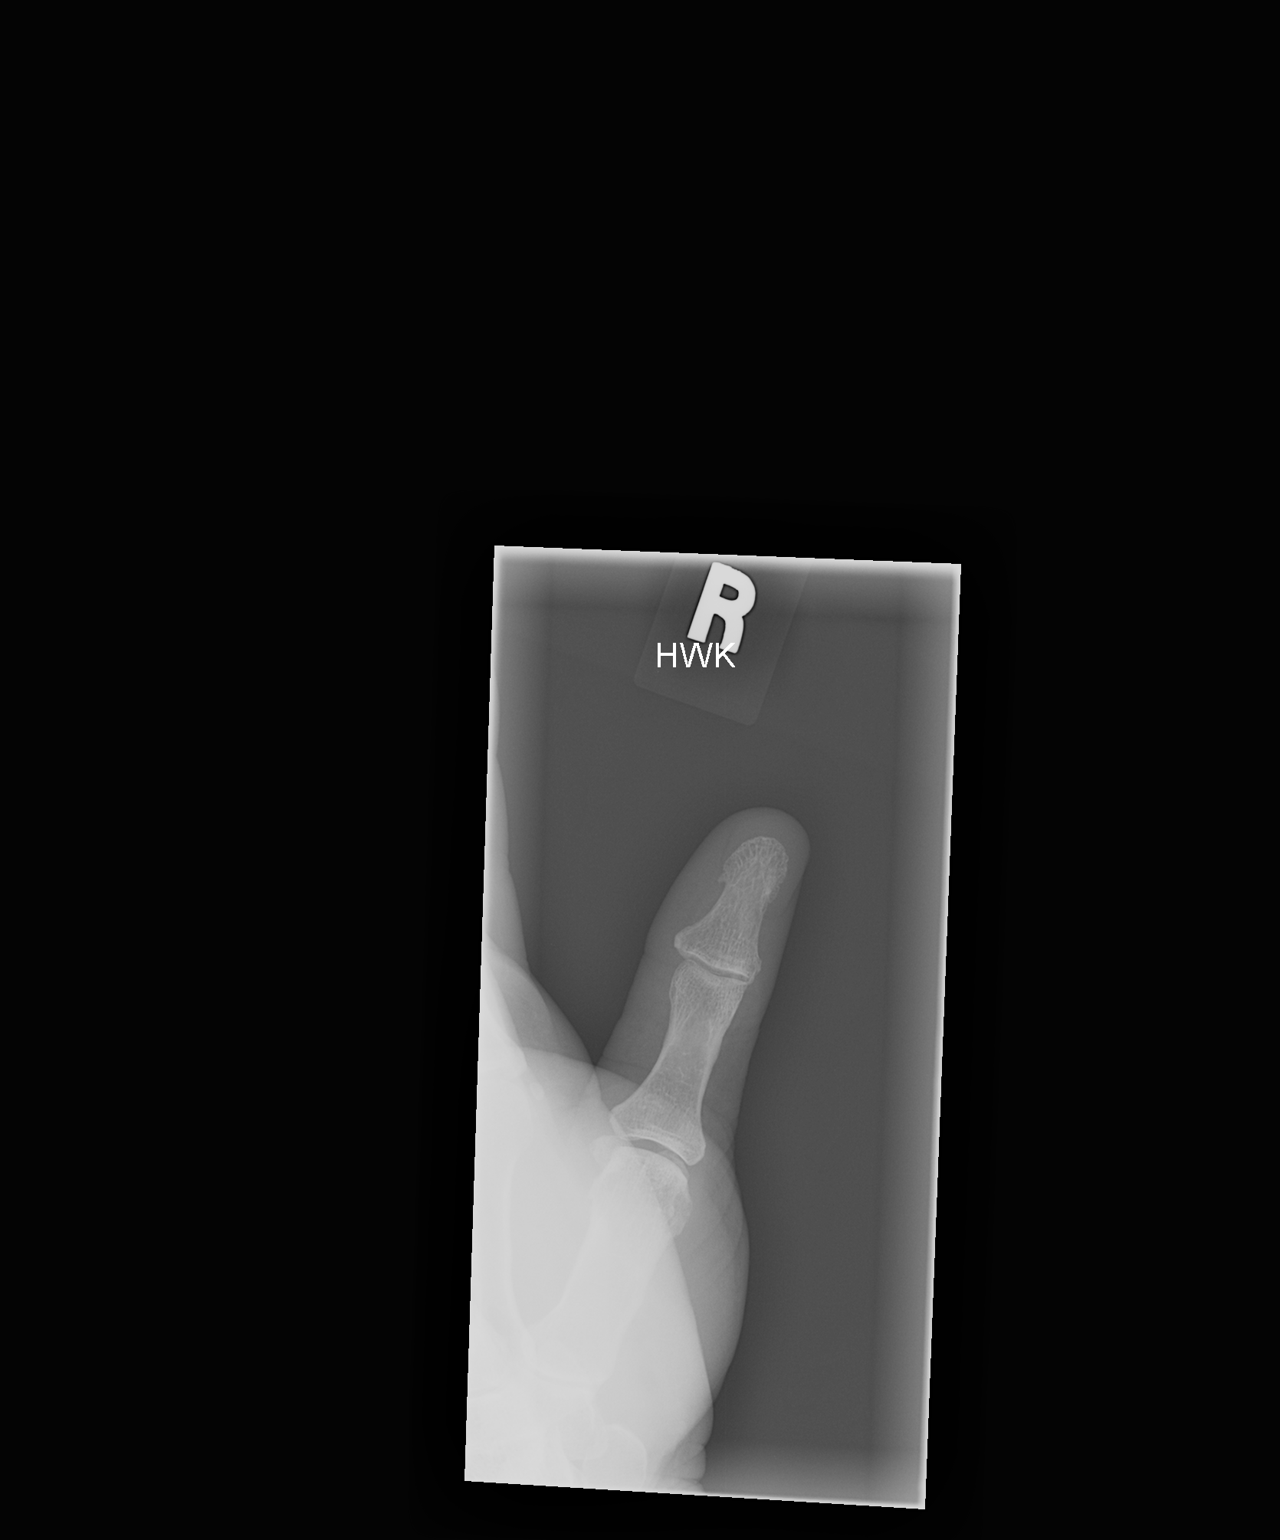

[view not recorded (2 of 3)]
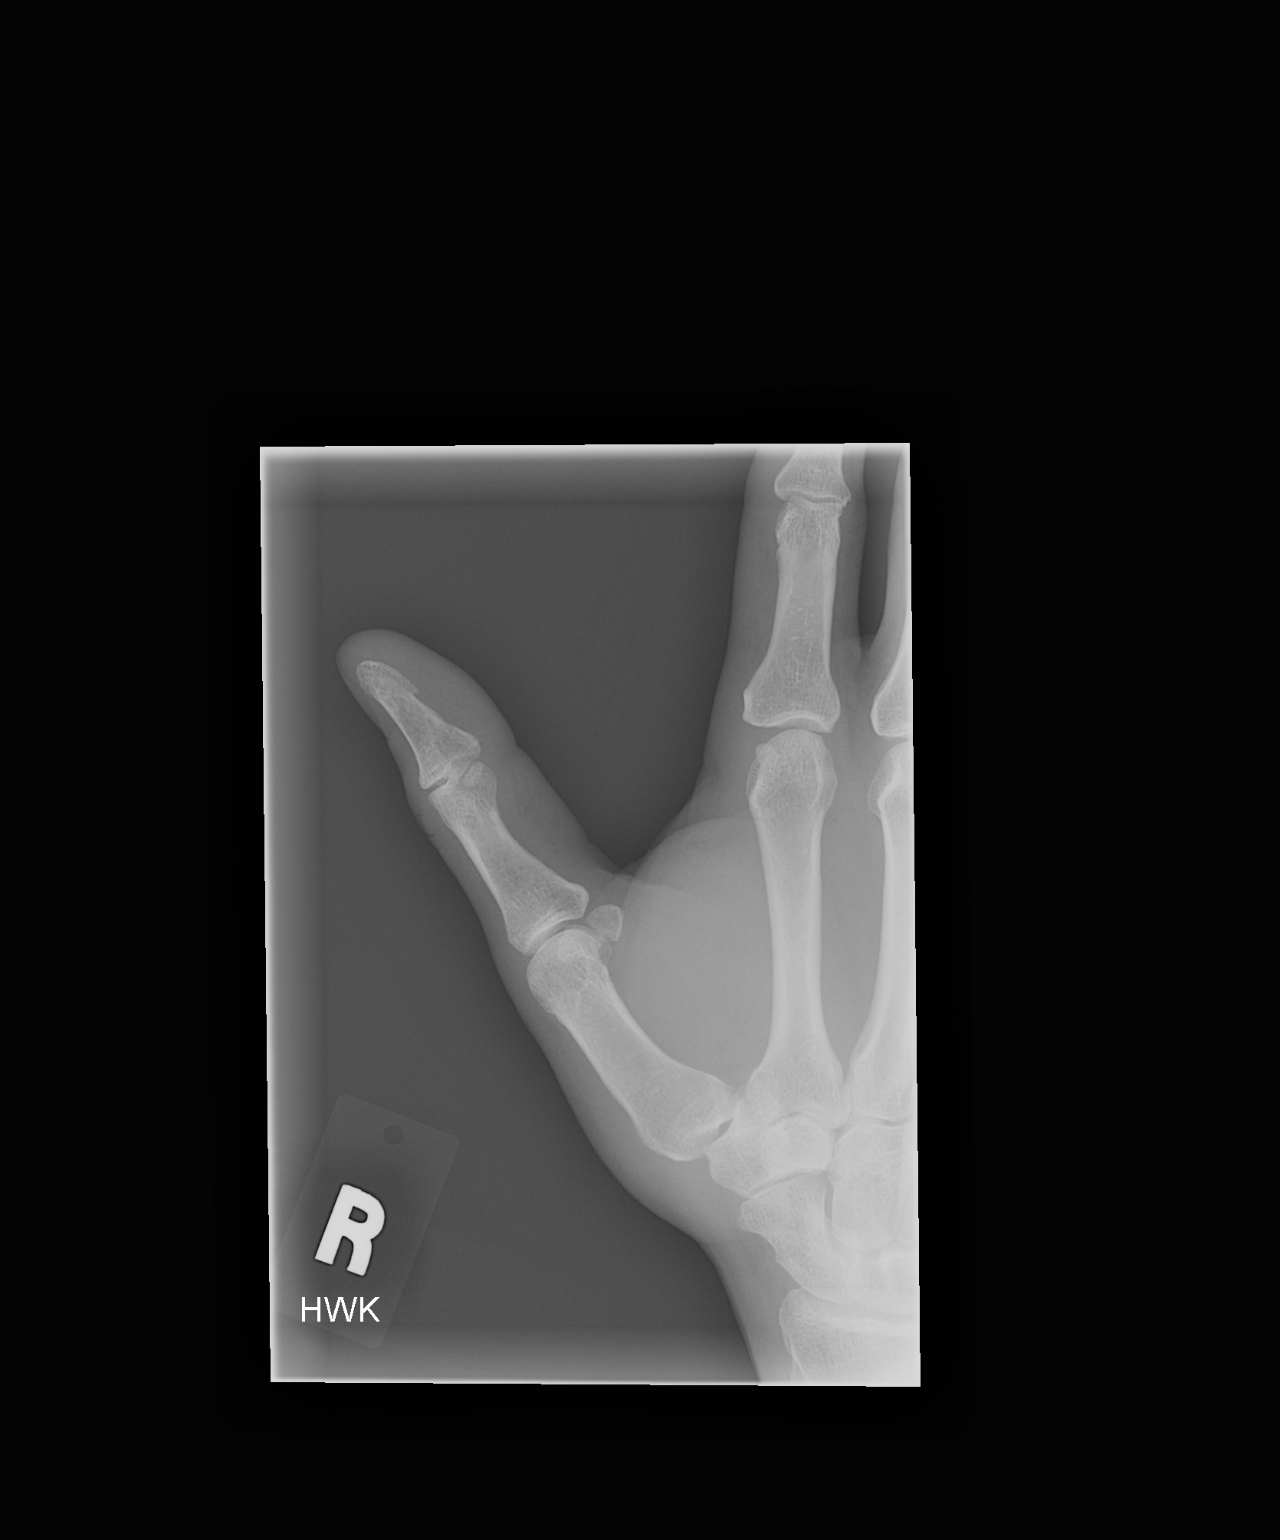

[view not recorded (3 of 3)]
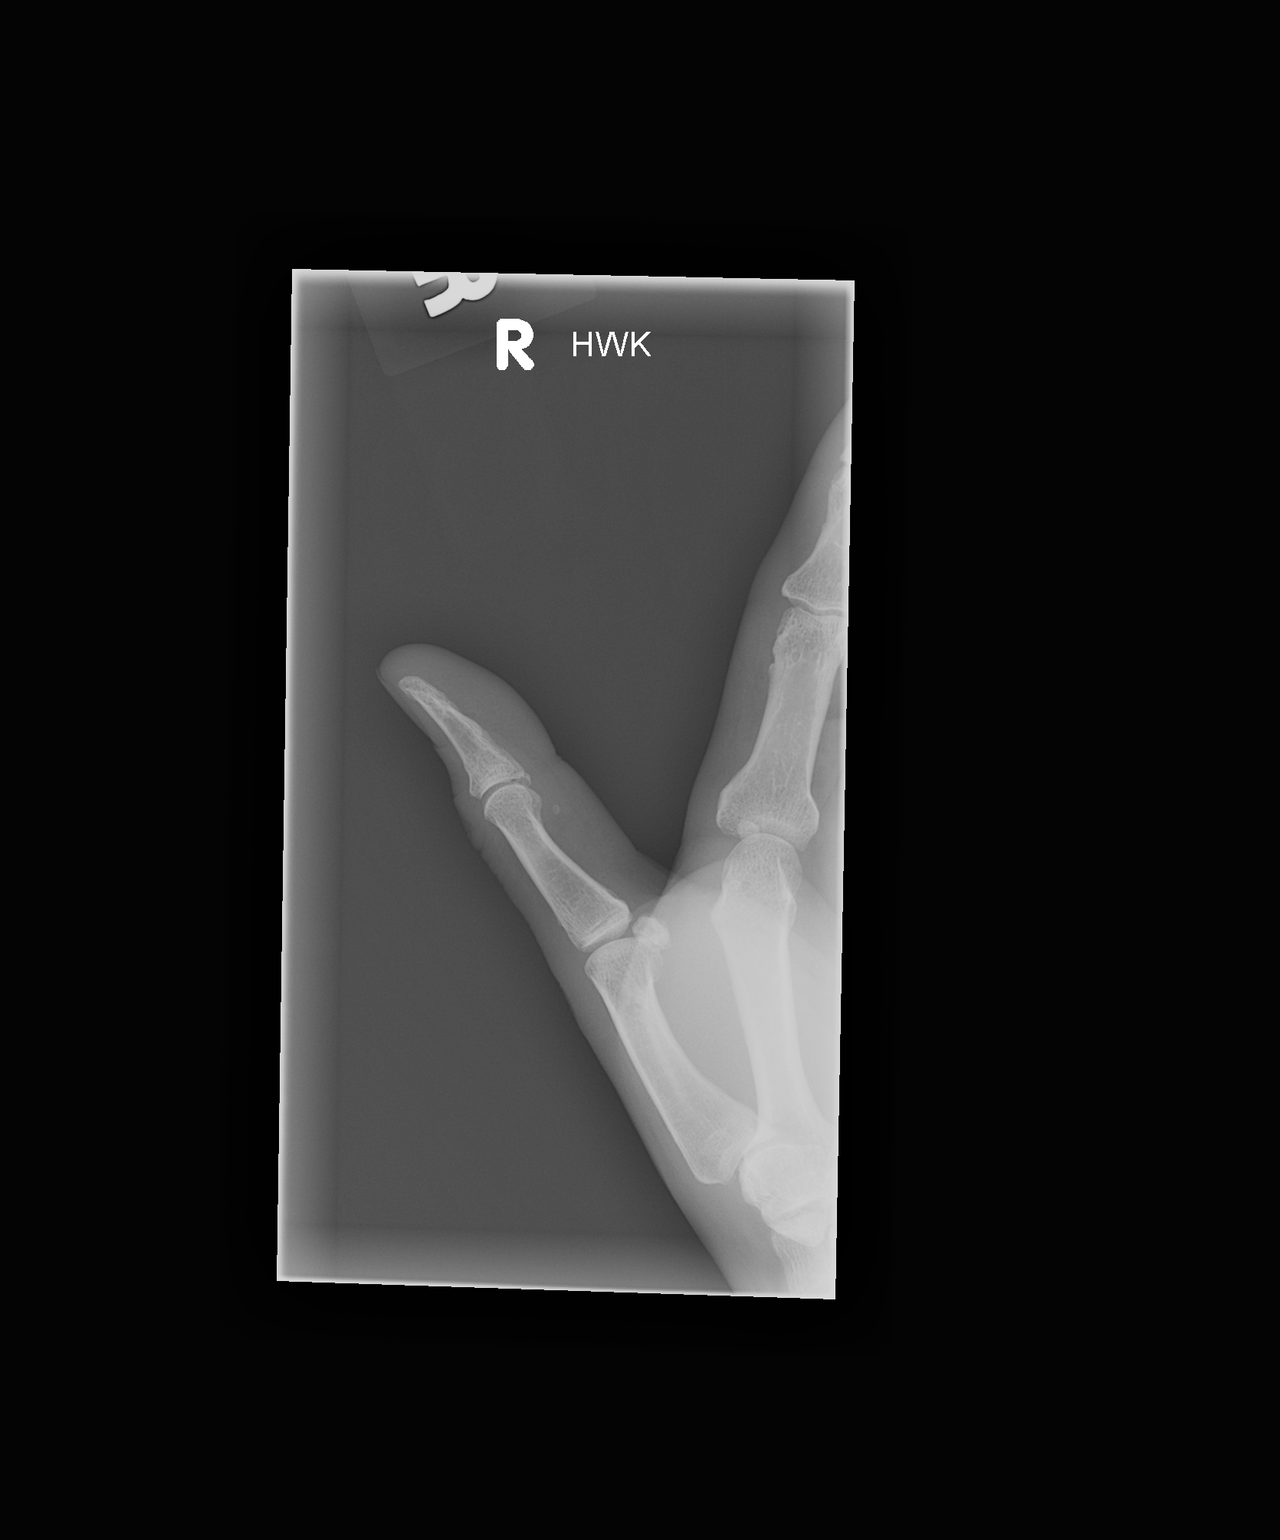

[3 of 3 positions shown; findings below may reference images not displayed]

FINDINGS: There are minimal degenerative changes of the first carpal
metacarpal joint. First metacarpal phalangeal joint and the
interphalangeal joint of the thumb appear normal.
IMPRESSION: Minimal degenerative changes of the first carpal metacarpal joint.

## 2016-06-23 ENCOUNTER — Encounter (HOSPITAL_COMMUNITY): Payer: Self-pay | Admitting: Emergency Medicine

## 2016-06-23 ENCOUNTER — Ambulatory Visit (HOSPITAL_COMMUNITY)
Admission: EM | Admit: 2016-06-23 | Discharge: 2016-06-23 | Disposition: A | Discharge status: Home / Self Care | Payer: BC Managed Care – PPO | Attending: Family Medicine | Admitting: Family Medicine

## 2016-06-23 DIAGNOSIS — N410 Acute prostatitis: Secondary | ICD-10-CM | POA: Diagnosis not present

## 2016-06-23 LAB — POCT URINALYSIS DIP (DEVICE)
Bilirubin Urine: NEGATIVE
GLUCOSE, UA: NEGATIVE mg/dL
Hgb urine dipstick: NEGATIVE
KETONES UR: NEGATIVE mg/dL
Leukocytes, UA: NEGATIVE
NITRITE: NEGATIVE
PROTEIN: NEGATIVE mg/dL
Specific Gravity, Urine: 1.025 (ref 1.005–1.030)
UROBILINOGEN UA: 0.2 mg/dL (ref 0.0–1.0)
pH: 6 (ref 5.0–8.0)

## 2016-06-23 MED ORDER — DOXYCYCLINE HYCLATE 100 MG PO CAPS: 100.0000 mg | ORAL_CAPSULE | Freq: Two times a day (BID) | ORAL | 0 refills | Status: DC

## 2016-06-23 NOTE — ED Notes (Signed)
Dirty and clean urine collected. 

## 2016-06-23 NOTE — ED Provider Notes (Signed)
[facility]    CSN: [id number] Arrival date & time: 06/23/16  1635     History   Chief Complaint Chief Complaint  Patient presents with  . Back Pain    HPI Wesley Lawson is a 61 y.o. male.   The history is provided by the patient.  Back Pain  Location:  Sacro-iliac joint Quality:  Burning Pain severity:  Mild Progression:  Unchanged Chronicity:  New Context comment:  Feels like prostate issue as before. Relieved by:  None tried Worsened by:  Nothing Ineffective treatments:  None tried Associated symptoms: pelvic pain   Associated symptoms: no abdominal pain, no dysuria, no fever and no paresthesias     Past Medical History:  Diagnosis Date  . Abnormal EKG   . Arthritis    hands, knees  . Chest pain    Hx - stress related   . Dyslipidemia   . Dyspnea    Hx  . GERD (gastroesophageal reflux disease)   . Hypertension     Patient Active Problem List   Diagnosis Date Noted  . Right-sided low back pain without sciatica 10/13/2015  . Obesity 10/13/2015  . Essential hypertension 10/13/2015  . Decreased range of hip movement 10/13/2015  . Right hip pain 02/21/2014  . Numbness of left hand 02/07/2014  . Pain of right thumb 02/07/2014    Past Surgical History:  Procedure Laterality Date  . WISDOM TOOTH EXTRACTION         Home Medications    Prior to Admission medications   Medication Sig Start Date End Date Taking? Authorizing Provider  lisinopril-hydrochlorothiazide (PRINZIDE,ZESTORETIC) 20-25 MG tablet Take 1 tablet by mouth daily. 11/27/15  Yes Camelia Eng Tysinger, PA-C  doxycycline (VIBRAMYCIN) 100 MG capsule Take 1 capsule (100 mg total) by mouth 2 (two) times daily. 06/23/16   Billy Fischer, MD    Family History Family History  Problem Relation Age of Onset  . Diabetes Mother   . Stroke Mother   . Diabetes Sister   . Colon polyps Neg Hx   . Esophageal cancer Neg Hx   . Rectal cancer Neg Hx   . Stomach cancer Neg Hx     Social  History Social History  Substance Use Topics  . Smoking status: Never Smoker  . Smokeless tobacco: Never Used  . Alcohol use 1.8 oz/week    3 Cans of beer per week     Allergies   Patient has no known allergies.   Review of Systems Review of Systems  Constitutional: Negative for fever.  Gastrointestinal: Negative for abdominal pain.  Genitourinary: Positive for pelvic pain and urgency. Negative for dysuria.  Musculoskeletal: Positive for back pain.  Neurological: Negative for paresthesias.  All other systems reviewed and are negative.    Physical Exam Triage Vital Signs ED Triage Vitals  Enc Vitals Group     BP 06/23/16 1752 114/72     Pulse Rate 06/23/16 1752 81     Resp 06/23/16 1752 18     Temp 06/23/16 1752 98.1 F (36.7 C)     Temp Source 06/23/16 1752 Oral     SpO2 06/23/16 1752 99 %     Weight --      Height --      Head Circumference --      Peak Flow --      Pain Score 06/23/16 1751 7     Pain Loc --      Pain Edu? --  Excl. in GC? --    No data found.   Updated Vital Signs BP 114/72 (BP Location: Left Arm)   Pulse 81   Temp 98.1 F (36.7 C) (Oral)   Resp 18   SpO2 99%   Visual Acuity Right Eye Distance:   Left Eye Distance:   Bilateral Distance:    Right Eye Near:   Left Eye Near:    Bilateral Near:     Physical Exam  Constitutional: He is oriented to person, place, and time. He appears well-developed and well-nourished. No distress.  Abdominal: Soft. Bowel sounds are normal.  Neurological: He is alert and oriented to person, place, and time.  Skin: Skin is warm and dry.  Nursing note and vitals reviewed.    UC Treatments / Results  Labs (all labs ordered are listed, but only abnormal results are displayed) Labs Reviewed  POCT URINALYSIS DIP (DEVICE)    EKG  EKG Interpretation None       Radiology No results found.  Procedures Procedures (including critical care time)  Medications Ordered in UC Medications -  No data to display   Initial Impression / Assessment and Plan / UC Course  I have reviewed the triage vital signs and the nursing notes.  Pertinent labs & imaging results that were available during my care of the patient were reviewed by me and considered in my medical decision making (see chart for details).       Final Clinical Impressions(s) / UC Diagnoses   Final diagnoses:  Acute bacterial prostatitis    New Prescriptions Discharge Medication List as of 06/23/2016  7:00 PM    START taking these medications   Details  doxycycline (VIBRAMYCIN) 100 MG capsule Take 1 capsule (100 mg total) by mouth 2 (two) times daily., Starting Wed 06/23/2016, Print         Billy Fischer, MD 07/13/16 [phone]

## 2016-06-23 NOTE — Discharge Instructions (Signed)
Take all of medicine as see urologist for further check.

## 2016-06-23 NOTE — ED Triage Notes (Signed)
The patient presented to the [facility] with a complaint of lower back pain, groin pain, urinary frequency and dysuria x 3 weeks. The patient also reported pain during sexual intercourse.

## 2016-07-27 ENCOUNTER — Other Ambulatory Visit: Payer: Self-pay | Admitting: Medical

## 2016-07-30 ENCOUNTER — Ambulatory Visit (INDEPENDENT_AMBULATORY_CARE_PROVIDER_SITE_OTHER): Payer: BC Managed Care – PPO | Admitting: Medical

## 2016-07-30 VITALS — BP 132/80 | HR 80 | Wt 236.6 lb

## 2016-07-30 DIAGNOSIS — M79606 Pain in leg, unspecified: Secondary | ICD-10-CM | POA: Diagnosis not present

## 2016-07-30 DIAGNOSIS — M25659 Stiffness of unspecified hip, not elsewhere classified: Secondary | ICD-10-CM | POA: Diagnosis not present

## 2016-07-30 DIAGNOSIS — N529 Male erectile dysfunction, unspecified: Secondary | ICD-10-CM | POA: Diagnosis not present

## 2016-07-30 DIAGNOSIS — M160 Bilateral primary osteoarthritis of hip: Secondary | ICD-10-CM | POA: Diagnosis not present

## 2016-07-30 DIAGNOSIS — I10 Essential (primary) hypertension: Secondary | ICD-10-CM | POA: Diagnosis not present

## 2016-07-30 DIAGNOSIS — R9431 Abnormal electrocardiogram [ECG] [EKG]: Secondary | ICD-10-CM | POA: Diagnosis not present

## 2016-07-30 DIAGNOSIS — E785 Hyperlipidemia, unspecified: Secondary | ICD-10-CM | POA: Insufficient documentation

## 2016-07-30 DIAGNOSIS — M47816 Spondylosis without myelopathy or radiculopathy, lumbar region: Secondary | ICD-10-CM | POA: Diagnosis not present

## 2016-07-30 DIAGNOSIS — M1612 Unilateral primary osteoarthritis, left hip: Secondary | ICD-10-CM | POA: Insufficient documentation

## 2016-07-30 MED ORDER — PRAVASTATIN SODIUM 20 MG PO TABS: 20.0000 mg | ORAL_TABLET | Freq: Every day | ORAL | 0 refills | Status: DC

## 2016-07-30 MED ORDER — HYDROCODONE-ACETAMINOPHEN 5-325 MG PO TABS: 1.0000 | ORAL_TABLET | Freq: Four times a day (QID) | ORAL | 0 refills | Status: DC | PRN

## 2016-07-30 MED ORDER — TAMSULOSIN HCL 0.4 MG PO CAPS: 0.4000 mg | ORAL_CAPSULE | Freq: Every day | ORAL | 3 refills | Status: DC

## 2016-07-30 MED ORDER — DICLOFENAC SODIUM 75 MG PO TBEC: 75.0000 mg | DELAYED_RELEASE_TABLET | Freq: Two times a day (BID) | ORAL | 0 refills | Status: DC

## 2016-07-30 NOTE — Progress Notes (Signed)
Subjective: Chief Complaint  Patient presents with  . lower back and rt hip pain    lower back pain and rt hip pain going in his leg   Here for low back pain, right hip pain, pain radiating to right leg.  When he goes to lip leg to go up stairs, gets pain.  Limping some.    Pains in his groin bilat too/upper thighs.  Having pain in same thigh area with sex.  No recent injury, trauma, no fall.    Sometimes gets tingling in right leg.  Sometimes gets numbness in hands at night when sleeping.  He saw ortho this past summer, had injection right hip. Has seen chiropractor in the past.  Having some problems getting and keeping erections.  This has been going on for months.  Has tried Cialis in the past and this worked better than Viagra.  No recent chest pain, SOB, edema.   Is compliant with BP medication.   Saw cardiologist years ago.  Not on statin.  Went to urgent care a month ago for prostatitis.  Does get groin discomfort occasionally. No current dysuria, frequency, urgency, blood in urine . Not currently taking flomax   Past Medical History:  Diagnosis Date  . Abnormal EKG   . Arthritis    hands, knees  . Chest pain    Hx - stress related   . Dyslipidemia   . Dyspnea    Hx  . GERD (gastroesophageal reflux disease)   . Hypertension    Current Outpatient Prescriptions on File Prior to Visit  Medication Sig Dispense Refill  . lisinopril-hydrochlorothiazide (PRINZIDE,ZESTORETIC) 20-25 MG tablet take 1 tablet by mouth once daily 90 tablet 1   No current facility-administered medications on file prior to visit.    Past Surgical History:  Procedure Laterality Date  . WISDOM TOOTH EXTRACTION     ROS as in subjective    Objective: BP 132/80   Pulse 80   Wt 236 lb 9.6 oz (107.3 kg)   SpO2 97%   BMI 34.44 kg/m   Gen: wd, wn, nad Skin: unremarkable Quite limited ROM of bilat hips, worse right, mild pain with ROM with both hips, worse right otherwise legs nontneder, no  swelling Lumbar spine nontneder, no midline tenderness, mild pain in low back with flexion but back ROM about 90% of normal He is overweight Pulses UE and LE 2+ Legs normal strength, sensation, DTRs.  GU: normal male circumcised, nontender, no mass, no lymphadenopathy Heart rrr, normal s1, s2, no murmurs Lungs clear No ext edema    Reviewed 10/2015 lumbar xray showing mild spondylosis Reviewed bilat hip xray 10/2015, and there were moderate degenerative changes in both hips, greater right than left Reviewed 11/2008 tress echo   Adult ECG Report  Indication: ED  Rate: 72 bpm  Rhythm: normal sinus rhythm  QRS Axis: 62 degrees  PR Interval: 197ms  QRS Duration: 5ms  QTc: 464ms  Conduction Disturbances: T wave inversions in II, III, V4-6  Other Abnormalities: none  Patient's cardiac risk factors are: dyslipidemia, hypertension, male gender and obesity (BMI >= 30 kg/m2).  EKG comparison: 2016  Narrative Interpretation: T wave abnormality, possible ischemia, possible prior inferior infarct    Assessment: Encounter Diagnoses  Name Primary?  . Osteoarthritis of both hips, unspecified osteoarthritis type Yes  . Spondylosis of lumbar spine   . Leg pain, anterior, unspecified laterality   . Decreased range of hip movement   . Erectile dysfunction, unspecified erectile dysfunction type   .  Essential hypertension   . Hyperlipidemia, unspecified hyperlipidemia type   . Abnormal EKG      Plan: Discussed his several concern, reviewed back over prior records.    Recommendations:  Begin Pravachol 20mg  daily at bedtime to lower cholesterol and lower heart disease risk  Continue your blood pressure medication  I want to refer you to cardiology given the abnormal EKG.    Lets wait on Cialis until you have clearance from cardiology  Begin short term Diclofenac once to twice daily to help with arthritis pain  Consider going back to orthopedic for injection  We will refer you to  physical therapy to help with back and hip pain  You can use Norco/Hydrocodone as needed for several pain, but this is a short term medication  Try and do some stretching daily  Walk for exercise daily  Spent > 45 minutes face to face with patient in discussion of symptoms, evaluation, plan and recommendations.    Dacorion was seen today for lower back and rt hip pain.  Diagnoses and all orders for this visit:  Osteoarthritis of both hips, unspecified osteoarthritis type -     Ambulatory referral to Physical Therapy  Spondylosis of lumbar spine -     Ambulatory referral to Physical Therapy  Leg pain, anterior, unspecified laterality -     Ambulatory referral to Physical Therapy  Decreased range of hip movement -     Ambulatory referral to Physical Therapy  Erectile dysfunction, unspecified erectile dysfunction type -     Ambulatory referral to Cardiology  Essential hypertension -     Ambulatory referral to Cardiology  Hyperlipidemia, unspecified hyperlipidemia type -     Ambulatory referral to Cardiology  Abnormal EKG -     Ambulatory referral to Cardiology  Other orders -     pravastatin (PRAVACHOL) 20 MG tablet; Take 1 tablet (20 mg total) by mouth at bedtime. -     HYDROcodone-acetaminophen (NORCO) 5-325 MG tablet; Take 1 tablet by mouth every 6 (six) hours as needed for moderate pain. -     diclofenac (VOLTAREN) 75 MG EC tablet; Take 1 tablet (75 mg total) by mouth 2 (two) times daily. -     tamsulosin (FLOMAX) 0.4 MG CAPS capsule; Take 1 capsule (0.4 mg total) by mouth daily.

## 2016-07-30 NOTE — Patient Instructions (Addendum)
Encounter Diagnoses  Name Primary?  . Osteoarthritis of both hips, unspecified osteoarthritis type Yes  . Spondylosis of lumbar spine   . Leg pain, anterior, unspecified laterality   . Decreased range of hip movement   . Erectile dysfunction, unspecified erectile dysfunction type   . Essential hypertension   . Hyperlipidemia, unspecified hyperlipidemia type    Recommendations:  Begin Pravachol 20mg  daily at bedtime to lower cholesterol and lower heart disease risk  Continue your blood pressure medication  I want to refer you to cardiology given the abnormal EKG.    Lets wait on Cialis until you have clearance from cardiology  Begin short term Diclofenac once to twice daily to help with arthritis pain  Consider going back to orthopedic for injection  We will refer you to physical therapy to help with back and hip pain  You can use Norco/Hydrocodone as needed for several pain, but this is a short term medication  Try and do some stretching daily  Walk for exercise daily

## 2016-08-04 NOTE — Progress Notes (Signed)
Cardiology Office Note    Date:  08/05/2016   ID:  Wesley Lawson, DOB March 04, 1956, MRN [id number]  PCP:  Crisoforo Oxford, PA-C  Cardiologist: New to [facility] - Dr. Claiborne Billings  Chief Complaint  Patient presents with  . New Patient (Initial Visit)    no current chest pain, occasional shortness of breath, no edema, no pain or cramping in legs,  occassional lightheadedness after taking meds     History of Present Illness:    Wesley Lawson is a 61 y.o. male with past medical history of HTN, HLD, and GERD who presents to the office today as a new patient referral for evaluation of an abnormal EKG.   Was seen by his PCP on 07/30/2016 for low-back pain and right hip pain. Also reported having ED and was interested in restarting Cialis. An EKG was obtained at that time which showed NSR, HR 72, with inferior and anterior TWI. These changes were new when compared to prior tracings in 2016.  In talking with the patient today, he denies any prior cardiac history. He had a stress test back in 2010 which he reports was normal. 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 He previously worked a second job with [employer] but retired from this in 2017.  He denies any exertional chest discomfort when climbing stairs or walking at work. He has noticed worsening dyspnea with exertion over the past several months along with generalized fatigue.  He denies any prior cardiac history. No family history of CAD. Does report his mother had a CVA in her 53s. Denies any tobacco use or recreational drug use. Does consume 2-3 beers per week.   Past Medical History:  Diagnosis Date  . Abnormal EKG   . Arthritis    hands, knees  . Chest pain    a. normal echo stress test in 2010 with mild to moderate LVH.   Marland Kitchen Dyslipidemia   . GERD (gastroesophageal reflux disease)   . Hypertension     Past Surgical History:  Procedure  Laterality Date  . WISDOM TOOTH EXTRACTION      Current Medications: Outpatient Medications Prior to Visit  Medication Sig Dispense Refill  . aspirin EC 81 MG tablet Take 81 mg by mouth daily.    . diclofenac (VOLTAREN) 75 MG EC tablet Take 1 tablet (75 mg total) by mouth 2 (two) times daily. 30 tablet 0  . HYDROcodone-acetaminophen (NORCO) 5-325 MG tablet Take 1 tablet by mouth every 6 (six) hours as needed for moderate pain. 20 tablet 0  . lisinopril-hydrochlorothiazide (PRINZIDE,ZESTORETIC) 20-25 MG tablet take 1 tablet by mouth once daily 90 tablet 1  . pravastatin (PRAVACHOL) 20 MG tablet Take 1 tablet (20 mg total) by mouth at bedtime. 90 tablet 0  . tamsulosin (FLOMAX) 0.4 MG CAPS capsule Take 1 capsule (0.4 mg total) by mouth daily. 90 capsule 3   No facility-administered medications prior to visit.      Allergies:   Patient has no known allergies.   Social History   Social History  . Marital status: Married    Spouse name: N/A  . Number of children: N/A  . Years of education: N/A   Social History Main Topics  . Smoking status: Never Smoker  . Smokeless tobacco: Never Used  . Alcohol use 1.8 oz/week    3 Cans of beer per week  .  Drug use: No  . Sexual activity: Yes   Other Topics Concern  . None   Social History Narrative  . None     Family History:  The patient's family history includes Diabetes in his mother and sister; Stroke in his mother.   Review of Systems:   Please see the history of present illness.     General:  No chills, fever, night sweats or weight changes. Positive for fatigue.  Cardiovascular:  No chest pain, edema, orthopnea, palpitations, paroxysmal nocturnal dyspnea. Positive for dyspnea with exertion.  Dermatological: No rash, lesions/masses Respiratory: No cough, dyspnea Urologic: No hematuria, dysuria Abdominal:   No nausea, vomiting, diarrhea, bright red blood per rectum, melena, or hematemesis Neurologic:  No visual changes, wkns,  changes in mental status. All other systems reviewed and are otherwise negative except as noted above.   Physical Exam:    VS:  BP 112/74 (BP Location: Right Arm, Patient Position: Sitting, Cuff Size: Large)   Pulse 78   Ht 5\' 10"  (1.778 m)   Wt 238 lb (108 kg)   BMI 34.15 kg/m    General: Well developed, well nourished [place] American male appearing in no acute distress. Head: Normocephalic, atraumatic, sclera non-icteric, no xanthomas, nares are without discharge.  Neck: No carotid bruits. JVD not elevated.  Lungs: Respirations regular and unlabored, without wheezes or rales.  Heart: Regular rate and rhythm. No S3 or S4.  No murmur, no rubs, or gallops appreciated. Abdomen: Soft, non-tender, non-distended with normoactive bowel sounds. No hepatomegaly. No rebound/guarding. No obvious abdominal masses. Msk:  Strength and tone appear normal for age. No joint deformities or effusions. Extremities: No clubbing or cyanosis. No edema.  Distal pedal pulses are 2+ bilaterally. Neuro: Alert and oriented X 3. Moves all extremities spontaneously. No focal deficits noted. Psych:  Responds to questions appropriately with a normal affect. Skin: No rashes or lesions noted  Wt Readings from Last 3 Encounters:  08/05/16 238 lb (108 kg)  07/30/16 236 lb 9.6 oz (107.3 kg)  01/13/16 228 lb 6.4 oz (103.6 kg)     Studies/Labs Reviewed:   EKG:  EKG is ordered today.  The ekg ordered today demonstrates NSR, HR 76, with TWI along anterior and lateral leads. - Personally Reviewed  Recent Labs: 11/27/2015: ALT 37; BUN 21; Creat 1.33; Hemoglobin 15.6; Platelets 224; Potassium 3.9; Sodium 136   Lipid Panel    Component Value Date/Time   CHOL 224 (H) 11/27/2015 0735   TRIG 195 (H) 11/27/2015 0735   HDL 45 11/27/2015 0735   CHOLHDL 5.0 11/27/2015 0735   VLDL 39 (H) 11/27/2015 0735   LDLCALC 140 (H) 11/27/2015 0735    Additional studies/ records that were reviewed today include:   Echo Stress  Test: 11/2008   EKG: 07/30/2016: NSR, HR 72, with inferior and anterior TWI.   EKG: 06/06/2015: NSR, HR 72. No acute ST or T-wave changes.   Assessment:    1. Abnormal ECG   2. Dyspnea on exertion   3. Essential hypertension   4. Dyslipidemia   5. Erectile dysfunction, unspecified erectile dysfunction type      Plan:   In order of problems listed above:  1. Dyspnea on Exertion/Abnormal EKG - recently seen by PCP with EKG at that time showing new inferior and anterior TWI when compared to prior tracings. Repeat imaging today shows anterolateral TWI which is new from 2016.  - he denies any prior cardiac history. Denies any recent chest discomfort. Has noticed worsening  dyspnea with exertion when climbing stairs at work along with generalized fatigue.  - will obtain a Treadmill Myoview for initial ischemic evaluation. He has known mild to moderate LVH by stress echo in 2010. This was discussed with Dr. Claiborne Billings (DOD) as Wesley Lawson is a new patient to our practice. He was in agreement with this assessment and plan.  2. Essential HTN - BP well-controlled at 112/74 during today's visit. - continue Lisinopril-HCTZ 20-25mg  daily.   3. HLD - Lipid Panel in 11/2015 showed total cholesterol 224, HDL 45, and LDL 140. - recently started on Pravastatin 20mg  daily by PCP.   4. Erectile Dysfunction - He is interested in restarting Cialis. I recommended to wait on restarting this until we have the results of his stress test.    Medication Adjustments/Labs and Tests Ordered: Current medicines are reviewed at length with the patient today.  Concerns regarding medicines are outlined above.  Medication changes, Labs and Tests ordered today are listed in the Patient Instructions below. Patient Instructions  Medication Instructions:  Continue current medications  Labwork: None Ordered  Testing/Procedures: Your physician has requested that you have an exercise stress myoview. For further  information please visit [email or web address]. Please follow instruction sheet, as given.  Follow-Up: Your physician recommends that you schedule a follow-up appointment in: Pending Stress test  Any Other Special Instructions Will Be Listed Below (If Applicable).  If you need a refill on your cardiac medications before your next appointment, please call your pharmacy.   Arna Medici, [place]  08/05/2016 4:01 PM    [facility] [place], [place], [place]  [address] Phone: [phone]; Fax: [phone]  [address] [place], [place] [address] Phone: [phone]

## 2016-08-05 ENCOUNTER — Ambulatory Visit (INDEPENDENT_AMBULATORY_CARE_PROVIDER_SITE_OTHER): Payer: BC Managed Care – PPO | Admitting: Student

## 2016-08-05 ENCOUNTER — Encounter: Payer: Self-pay | Admitting: Student

## 2016-08-05 VITALS — BP 112/74 | HR 78 | Ht 70.0 in | Wt 238.0 lb

## 2016-08-05 DIAGNOSIS — E785 Hyperlipidemia, unspecified: Secondary | ICD-10-CM | POA: Diagnosis not present

## 2016-08-05 DIAGNOSIS — R0609 Other forms of dyspnea: Secondary | ICD-10-CM

## 2016-08-05 DIAGNOSIS — I10 Essential (primary) hypertension: Secondary | ICD-10-CM

## 2016-08-05 DIAGNOSIS — N529 Male erectile dysfunction, unspecified: Secondary | ICD-10-CM | POA: Diagnosis not present

## 2016-08-05 DIAGNOSIS — R9431 Abnormal electrocardiogram [ECG] [EKG]: Secondary | ICD-10-CM | POA: Diagnosis not present

## 2016-08-05 NOTE — Patient Instructions (Signed)
Medication Instructions:  Continue current medications  Labwork: None Ordered  Testing/Procedures: Your physician has requested that you have an exercise stress myoview. For further information please visit [email or web address]. Please follow instruction sheet, as given.  Follow-Up: Your physician recommends that you schedule a follow-up appointment in: Pending Stress test   Any Other Special Instructions Will Be Listed Below (If Applicable).   If you need a refill on your cardiac medications before your next appointment, please call your pharmacy.

## 2016-08-06 ENCOUNTER — Ambulatory Visit: Payer: BC Managed Care – PPO | Admitting: Physician Assistant

## 2016-08-10 ENCOUNTER — Telehealth (HOSPITAL_COMMUNITY): Payer: Self-pay

## 2016-08-10 NOTE — Telephone Encounter (Signed)
Encounter complete. 

## 2016-08-12 DIAGNOSIS — Z9289 Personal history of other medical treatment: Secondary | ICD-10-CM

## 2016-08-12 HISTORY — DX: Personal history of other medical treatment: Z92.89

## 2016-08-13 ENCOUNTER — Ambulatory Visit (HOSPITAL_COMMUNITY)
Admission: RE | Admit: 2016-08-13 | Discharge: 2016-08-13 | Disposition: A | Discharge status: Home / Self Care | Payer: BC Managed Care – PPO | Source: Ambulatory Visit | Attending: Cardiovascular Disease | Admitting: Cardiovascular Disease

## 2016-08-13 DIAGNOSIS — R0609 Other forms of dyspnea: Secondary | ICD-10-CM | POA: Insufficient documentation

## 2016-08-13 DIAGNOSIS — R9431 Abnormal electrocardiogram [ECG] [EKG]: Secondary | ICD-10-CM | POA: Diagnosis present

## 2016-08-13 LAB — MYOCARDIAL PERFUSION IMAGING
CHL CUP NUCLEAR SDS: 3
Estimated workload: 9.4 METS
Exercise duration (min): 7 min
Exercise duration (sec): 34 s
LV sys vol: 43 mL
LVDIAVOL: 99 mL (ref 62–150)
MPHR: 160 {beats}/min
NUC STRESS TID: 0.91
Peak HR: 155 {beats}/min
Percent HR: 96 %
RPE: 17
Rest HR: 60 {beats}/min
SRS: 3
SSS: 6

## 2016-08-13 MED ORDER — TECHNETIUM TC 99M TETROFOSMIN IV KIT
10.3000 | PACK | Freq: Once | INTRAVENOUS | Status: AC | PRN
  Administered 2016-08-13: 10.3 via INTRAVENOUS
  Filled 2016-08-13: qty 11

## 2016-08-13 MED ORDER — TECHNETIUM TC 99M TETROFOSMIN IV KIT
32.0000 | PACK | Freq: Once | INTRAVENOUS | Status: AC | PRN
  Administered 2016-08-13: 32 via INTRAVENOUS
  Filled 2016-08-13: qty 32

## 2016-08-31 ENCOUNTER — Other Ambulatory Visit: Payer: Self-pay | Admitting: Medical

## 2016-08-31 ENCOUNTER — Telehealth: Payer: Self-pay | Admitting: Medical

## 2016-08-31 MED ORDER — TADALAFIL 20 MG PO TABS: ORAL_TABLET | ORAL | 0 refills | Status: DC

## 2016-08-31 NOTE — Telephone Encounter (Signed)
Called and lm for pt

## 2016-08-31 NOTE — Telephone Encounter (Signed)
Pt called and wants to know if he can get a RX for cialis he went to have a stress test and it come back ok and states that if it come back ok then you would approve for him to have the cialis, pt uses [facility], [place] pt can be reached at [phone]

## 2016-08-31 NOTE — Telephone Encounter (Signed)
I reviewed his cardiology notes.  I sent trial of Cialis to pharmacy.  Have him discuss proper use, risks/benefits of medication with the pharmacist unless he is coming back for f/u first.    Take 1/2 tablet 30-45 minutes before intercourse.   If this doesn't work, then the next time he uses this, he can use 1 tablet instead of one half tablet on a different day.   Go over side effects  F/u 2wk     CIALIS can cause serious side effects: Do not take Cialis if you now or in the future use any medicines called nitrates, often prescribed for chest pain, or Adempas (riociguat) for pulmonary hypertension. Your blood pressure could drop to an unsafe level  A rarely reported side effect could include an erection that will not go away (priapism). If you have an erection that lasts more than 1 hours, get medical help right away. If it is not treated right away, priapism can permanently damage your penis  If you have sudden vision loss in one or both eyes, this can be a sign of a serious eye problem (non-arteritic anterior ischemic optic neuropathy.  Stop taking cialis and call your healthcare provider right away if you have any sudden vision loss  Call your healthcare provider right away if you have sudden hearing decreased, hearing loss or sudden ringing in the ears.  If you experience chest pain, dizziness, or nausea during sex, seek immediate medical help

## 2016-09-01 NOTE — Telephone Encounter (Signed)
Spoke with patient about this and give him the side effect. He is going to check with his pharmacy about the price. If he can't afford it he will call us back.

## 2016-09-02 ENCOUNTER — Telehealth: Payer: Self-pay | Admitting: Medical

## 2016-09-02 NOTE — Telephone Encounter (Signed)
Pt states insurance won't pay for Cialis, cost $800 for #10.  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 Nothing else tried

## 2016-09-04 NOTE — Telephone Encounter (Signed)
P.A. CIALIS  °

## 2016-09-21 NOTE — Telephone Encounter (Signed)
Response states plan not enable for EPA.  Called [employer] t# [phone] states all ED meds are plan exclusions.

## 2016-09-22 NOTE — Telephone Encounter (Signed)
Wesley Lawson, 2 questions first can pt be switched to trial of Sildenafil ? But will require P.A. So will take few days so can he have sample of Cialis ?

## 2016-09-22 NOTE — Telephone Encounter (Signed)
Called plan back t# [phone] spoke with supervisor, Cialis & Levitra are both plan exclusions, can fax appeal at t# [phone] or can do P.A. For Sildenafil at t# [phone]

## 2016-09-22 NOTE — Telephone Encounter (Signed)
[place] t# [phone] [facility] ID# [id number] is plan exclusion but pt has paid for ED benefits so plan pays [id number] HRA benefits & pt is responsible for $47 for #10.  Called pharmacy & they state NDC not covered & will put in a ticket to help desk.

## 2016-09-23 NOTE — Telephone Encounter (Signed)
Give a sample if we have it.  I saw some samples today

## 2016-09-24 NOTE — Telephone Encounter (Signed)
Sample given, pt informed

## 2016-09-29 ENCOUNTER — Other Ambulatory Visit: Payer: Self-pay | Admitting: Medical

## 2016-09-29 MED ORDER — SILDENAFIL CITRATE 20 MG PO TABS: ORAL_TABLET | ORAL | 2 refills | Status: DC

## 2016-09-29 NOTE — Telephone Encounter (Signed)
I sent Sildenafil. 

## 2016-09-29 NOTE — Telephone Encounter (Signed)
Can you send in Rx for Sildenafil so I can work on prior [employer]

## 2016-10-05 ENCOUNTER — Telehealth: Payer: Self-pay | Admitting: Medical

## 2016-10-05 NOTE — Telephone Encounter (Signed)
Initiatied P.A. Sildenafil

## 2016-10-05 NOTE — Telephone Encounter (Signed)
P.A. SILDENAFIL  

## 2016-10-07 ENCOUNTER — Ambulatory Visit (HOSPITAL_COMMUNITY)
Admission: EM | Admit: 2016-10-07 | Discharge: 2016-10-07 | Disposition: A | Discharge status: Home / Self Care | Payer: BC Managed Care – PPO | Attending: Family Medicine | Admitting: Family Medicine

## 2016-10-07 ENCOUNTER — Encounter (HOSPITAL_COMMUNITY): Payer: Self-pay | Admitting: Emergency Medicine

## 2016-10-07 DIAGNOSIS — H9201 Otalgia, right ear: Secondary | ICD-10-CM

## 2016-10-07 DIAGNOSIS — H6121 Impacted cerumen, right ear: Secondary | ICD-10-CM | POA: Diagnosis not present

## 2016-10-07 NOTE — ED Provider Notes (Signed)
CSN: [id number]     Arrival date & time 10/07/16  1913 History   None    Chief Complaint  Patient presents with  . Otalgia   (Consider location/radiation/quality/duration/timing/severity/associated sxs/prior Treatment) Patient c/o right ear pain and discomfort from ear wax.   The history is provided by the patient.  Otalgia  Location:  Right Quality:  Aching Severity:  Moderate Onset quality:  Sudden Duration:  1 day Timing:  Constant Chronicity:  New Relieved by:  Nothing Worsened by:  Nothing   Past Medical History:  Diagnosis Date  . Abnormal EKG   . Arthritis    hands, knees  . Chest pain    a. normal echo stress test in 2010 with mild to moderate LVH.   Marland Kitchen Dyslipidemia   . GERD (gastroesophageal reflux disease)   . Hypertension    Past Surgical History:  Procedure Laterality Date  . WISDOM TOOTH EXTRACTION     Family History  Problem Relation Age of Onset  . Diabetes Mother   . Stroke Mother   . Diabetes Sister   . Colon polyps Neg Hx   . Esophageal cancer Neg Hx   . Rectal cancer Neg Hx   . Stomach cancer Neg Hx    Social History  Substance Use Topics  . Smoking status: Never Smoker  . Smokeless tobacco: Never Used  . Alcohol use 1.8 oz/week    3 Cans of beer per week    Review of Systems  Constitutional: Negative.   HENT: Positive for ear pain.   Eyes: Negative.   Respiratory: Negative.   Cardiovascular: Negative.   Gastrointestinal: Negative.   Endocrine: Negative.   Genitourinary: Negative.   Musculoskeletal: Negative.   Allergic/Immunologic: Negative.   Neurological: Negative.   Hematological: Negative.   Psychiatric/Behavioral: Negative.     Allergies  Patient has no known allergies.  Home Medications   Prior to Admission medications   Medication Sig Start Date End Date Taking? Authorizing Provider  aspirin EC 81 MG tablet Take 81 mg by mouth daily.    Historical Provider, MD  diclofenac (VOLTAREN) 75 MG EC tablet Take 1  tablet (75 mg total) by mouth 2 (two) times daily. 07/30/16   Camelia Eng Tysinger, PA-C  HYDROcodone-acetaminophen (NORCO) 5-325 MG tablet Take 1 tablet by mouth every 6 (six) hours as needed for moderate pain. 07/30/16   Camelia Eng Tysinger, PA-C  lisinopril-hydrochlorothiazide (PRINZIDE,ZESTORETIC) 20-25 MG tablet take 1 tablet by mouth once daily 07/27/16   Camelia Eng Tysinger, PA-C  pravastatin (PRAVACHOL) 20 MG tablet Take 1 tablet (20 mg total) by mouth at bedtime. 07/30/16   Camelia Eng Tysinger, PA-C  sildenafil (REVATIO) 20 MG tablet 1-5 tablets (20 mg to 100 mg) prior to sexual activity daily prn 09/29/16   Camelia Eng Tysinger, PA-C  tamsulosin (FLOMAX) 0.4 MG CAPS capsule Take 1 capsule (0.4 mg total) by mouth daily. 07/30/16   Carlena Hurl, PA-C   Meds Ordered and Administered this Visit  Medications - No data to display  BP 133/73   Pulse 89   Temp 98.7 F (37.1 C) (Oral)   Resp 16   Ht 5\' 10"  (1.778 m)   Wt 230 lb (104.3 kg)   SpO2 97%   BMI 33.00 kg/m  No data found.   Physical Exam  Constitutional: He appears well-developed and well-nourished.  HENT:  Head: Normocephalic and atraumatic.  Left Ear: External ear normal.  Mouth/Throat: Oropharynx is clear and moist.  Right TM blocked due  to cerumen impaction  Eyes: Conjunctivae and EOM are normal. Pupils are equal, round, and reactive to light.  Neck: Normal range of motion. Neck supple.  Cardiovascular: Normal rate, regular rhythm and normal heart sounds.   Pulmonary/Chest: Effort normal and breath sounds normal.  Abdominal: Soft. Bowel sounds are normal.  Nursing note and vitals reviewed.   Urgent Care Course     Procedures (including critical care time)  Labs Review Labs Reviewed - No data to display  Imaging Review No results found.   Visual Acuity Review  Right Eye Distance:   Left Eye Distance:   Bilateral Distance:    Right Eye Near:   Left Eye Near:    Bilateral Near:         MDM   1. Impacted  cerumen of right ear    Advised to use ear drops otc as directed  Right ear TM wnl and cerumen impaction is removed with lavage.    Lysbeth Penner, FNP 10/07/16 1944

## 2016-10-07 NOTE — ED Triage Notes (Signed)
PT reports right ear pain that started today.

## 2016-10-10 NOTE — Telephone Encounter (Signed)
P.A. Denied, plan only covers Sildenafil for pulmonary arterial hypertension or secondary Raynaud's phenomenon

## 2016-10-13 ENCOUNTER — Encounter: Payer: Self-pay | Admitting: Medical

## 2016-10-13 ENCOUNTER — Ambulatory Visit (INDEPENDENT_AMBULATORY_CARE_PROVIDER_SITE_OTHER): Payer: BC Managed Care – PPO | Admitting: Medical

## 2016-10-13 VITALS — BP 130/86 | HR 83 | Wt 241.0 lb

## 2016-10-13 DIAGNOSIS — B029 Zoster without complications: Secondary | ICD-10-CM

## 2016-10-13 MED ORDER — IBUPROFEN 800 MG PO TABS: 800.0000 mg | ORAL_TABLET | Freq: Three times a day (TID) | ORAL | 0 refills | Status: DC | PRN

## 2016-10-13 MED ORDER — HYDROCORTISONE 2.5 % EX CREA
TOPICAL_CREAM | Freq: Two times a day (BID) | CUTANEOUS | 0 refills | Status: DC
Start: 2016-10-13 — End: 2017-06-13

## 2016-10-13 MED ORDER — VALACYCLOVIR HCL 1 G PO TABS: 1000.0000 mg | ORAL_TABLET | Freq: Three times a day (TID) | ORAL | 0 refills | Status: DC

## 2016-10-13 NOTE — Patient Instructions (Signed)

## 2016-10-13 NOTE — Progress Notes (Signed)
Subjective:     Patient ID: Wesley Lawson, male   DOB: Jul 17, 1955, 61 y.o.   MRN: [id number]  HPI Chief Complaint  Patient presents with  . Ear Pain    rt ear pain and rash on neck and face    Here for right ear pain and rash.  Went to urgent care last week, had wax flushed out but still having right ear pain only.   Has rash on back of scalp and neck on right that developed few days ago.   denies any new exposure no concern for poison ivy rash, never hard poison ivy rash in past.   No recent exposure on face/neck otherwise.  Has been under some stress, went to funeral this past weekend.  No other aggravating or relieving factors. No other complaint.  Past Medical History:  Diagnosis Date  . Abnormal EKG   . Arthritis    hands, knees  . Chest pain    a. normal echo stress test in 2010 with mild to moderate LVH.   Marland Kitchen Dyslipidemia   . GERD (gastroesophageal reflux disease)   . Hypertension    Current Outpatient Prescriptions on File Prior to Visit  Medication Sig Dispense Refill  . aspirin EC 81 MG tablet Take 81 mg by mouth daily.    . diclofenac (VOLTAREN) 75 MG EC tablet Take 1 tablet (75 mg total) by mouth 2 (two) times daily. 30 tablet 0  . lisinopril-hydrochlorothiazide (PRINZIDE,ZESTORETIC) 20-25 MG tablet take 1 tablet by mouth once daily 90 tablet 1  . pravastatin (PRAVACHOL) 20 MG tablet Take 1 tablet (20 mg total) by mouth at bedtime. 90 tablet 0  . sildenafil (REVATIO) 20 MG tablet 1-5 tablets (20 mg to 100 mg) prior to sexual activity daily prn 50 tablet 2  . tamsulosin (FLOMAX) 0.4 MG CAPS capsule Take 1 capsule (0.4 mg total) by mouth daily. 90 capsule 3   No current facility-administered medications on file prior to visit.     Review of Systems As in subjective    Objective:   Physical Exam BP 130/86   Pulse 83   Wt 241 lb (109.3 kg)   SpO2 99%   BMI 34.58 kg/m   gen: wd, wn, nad Left ear canal with moderate cerumen, right TM pearly, no wax There is  scattered patches of erythema and vesicular lesions of right scalp and neck in C2 dermatome including posterior to ear, superior posterior to ear, inferior to each, and upper lateral neck, somewhat tender.  The patch below ear has unroofed lesions otherwise no rash HENT otherwise normal No neck lymphadenopathy      Assessment:     Encounter Diagnosis  Name Primary?  . Herpes zoster without complication Yes       Plan:     Discussed the symptoms, exam findings, suggestive of shingles outbreak.  discussed transmission, precautions to prevent spread to others, particularly high risk groups as discussed including young children, elderly, immunocompromised people, or pregnant women.  Discussed treatment including relative rest, hydration, can use OTC Cortaid topically but don't let others use the cream and discard the cream after this episode of shingles.   Begin Valtrex as below, can use NSAID, cream topically as below.   discussed the possibility of post herpetic neuralgia.   answered their questions, After visit summary given.   Wesley Lawson was seen today for ear pain.  Diagnoses and all orders for this visit:  Herpes zoster without complication -     Varicella-zoster  by PCR -     Varicella zoster antibody, IgG  Other orders -     valACYclovir (VALTREX) 1000 MG tablet; Take 1 tablet (1,000 mg total) by mouth 3 (three) times daily. -     ibuprofen (ADVIL,MOTRIN) 800 MG tablet; Take 1 tablet (800 mg total) by mouth every 8 (eight) hours as needed. -     hydrocortisone 2.5 % cream; Apply topically 2 (two) times daily.

## 2016-10-14 LAB — VARICELLA ZOSTER ANTIBODY, IGG: Varicella IgG: 1648 Index — ABNORMAL HIGH (ref <135.00)

## 2016-10-15 ENCOUNTER — Telehealth: Payer: Self-pay | Admitting: Medical

## 2016-10-15 LAB — VARICELLA-ZOSTER BY PCR: VZV DNA, QL PCR: DETECTED — AB

## 2016-10-15 NOTE — Telephone Encounter (Signed)
Pt said he still has the blisters on his neck, head, ear and those areas still hurt. He is still taking Valtrex and applying ointment as instructed. Should pt be concerned the blisters are not going away yet and pain not going away by now?

## 2016-10-15 NOTE — Telephone Encounter (Signed)
Called pt  I explain to him that it take a few for this to clear up and if has any pain he can take Advil to help with pain.

## 2016-10-15 NOTE — Telephone Encounter (Signed)
The tests came back + verifying shingles.  C/t the medications I prescribed, and it could take a few weeks for the rash/blisters to completely go away.  They should start to crust over in the next several days though.    Does he feel like he needs some stronger pain medication temporarily to help, stronger than the ibuprofen?

## 2016-10-21 ENCOUNTER — Ambulatory Visit (INDEPENDENT_AMBULATORY_CARE_PROVIDER_SITE_OTHER): Payer: BC Managed Care – PPO | Admitting: Medical

## 2016-10-21 ENCOUNTER — Encounter: Payer: Self-pay | Admitting: Medical

## 2016-10-21 VITALS — BP 142/88 | HR 77 | Wt 240.8 lb

## 2016-10-21 DIAGNOSIS — R51 Headache: Secondary | ICD-10-CM | POA: Diagnosis not present

## 2016-10-21 DIAGNOSIS — N529 Male erectile dysfunction, unspecified: Secondary | ICD-10-CM

## 2016-10-21 DIAGNOSIS — B029 Zoster without complications: Secondary | ICD-10-CM | POA: Diagnosis not present

## 2016-10-21 DIAGNOSIS — R21 Rash and other nonspecific skin eruption: Secondary | ICD-10-CM

## 2016-10-21 DIAGNOSIS — R519 Headache, unspecified: Secondary | ICD-10-CM | POA: Insufficient documentation

## 2016-10-21 DIAGNOSIS — B001 Herpesviral vesicular dermatitis: Secondary | ICD-10-CM | POA: Diagnosis not present

## 2016-10-21 MED ORDER — VALACYCLOVIR HCL 1 G PO TABS: ORAL_TABLET | ORAL | 1 refills | Status: DC

## 2016-10-21 MED ORDER — ASPIRIN EC 81 MG PO TBEC: 81.0000 mg | DELAYED_RELEASE_TABLET | Freq: Every day | ORAL | 3 refills | Status: DC

## 2016-10-21 NOTE — Progress Notes (Signed)
Subjective:     Patient ID: Wesley Lawson, male   DOB: 24-Feb-1956, 61 y.o.   MRN: [id number]  HPI Here for f/u on shingles. I saw him 8 days ago for first shingles outbreak right scalp, behind and below right ear.   He is overall improved, having crusted lesions now.  He has been using precaution to prevent spread to others.   Finished valtrex.   His wife wanted to know if it was ok for him to have sex.  He recently got sildenafil form urology, but wanted to check with me about safety.  He also gets cold sores few times per year, questions about this.  Uses Abreva OTC typically.    No other aggravating or relieving factors. No other complaint.  Review of Systems     Objective:   Physical Exam BP (!) 142/88   Pulse 77   Wt 240 lb 12.8 oz (109.2 kg)   SpO2 97%   BMI 34.55 kg/m   gen: wd, wn, nad Right posterolateral scalp with 3 areas of scab/crust from recent shingles rash, few other areas in same area with pink skin, healing appropriately from recent shingles vascular lesions HENT otherwise unremarkable      Assessment:     Encounter Diagnoses  Name Primary?  . Herpes zoster without complication Yes  . Scalp pain   . Rash   . Erectile dysfunction, unspecified erectile dysfunction type   . Cold sore       Plan:     Shingles and hx/o cold sores - shingles rash is much improved.  discussed precautions, preventing spread of diease, hand washing, and encouraged wife to get shingrix given her age.   Discussed that the risk is low for sexual transmission but may want to use condoms until completley resolved, and c/t avoiding touching face/scalp until completley resolved.  Can use valtrex prn for shingles or cold sore outbreak as labeled for each use.   Discussed viral tranmission of cold sores as well, precautions.  Erectile Dysfunction - Reviewed pathophysiology and differential diagnosis of erectile dysfunction with the patient.  Discussed treatment,  Discussed potential risks  of medications including hypotension and priapism.  Discussed proper use of medication.  Questions were answered.   Wesley Lawson was seen today for ear pain.  Diagnoses and all orders for this visit:  Herpes zoster without complication  Scalp pain  Rash  Erectile dysfunction, unspecified erectile dysfunction type  Cold sore  Other orders -     aspirin EC 81 MG tablet; Take 1 tablet (81 mg total) by mouth daily. -     valACYclovir (VALTREX) 1000 MG tablet; 2 tablets twice daily for 1 day for cold sore, or TID x 1 week for shingles

## 2016-11-05 NOTE — Telephone Encounter (Signed)
P.A. Denied only coverage allowed for [facility] or Raynaud's phenomenon.  Called pt & he had Urologist call in generic Sildenafil & paying out of pocket, advised to call back when needs refill and I will give him the options I know of to see which are cheaper.

## 2017-01-03 ENCOUNTER — Telehealth (INDEPENDENT_AMBULATORY_CARE_PROVIDER_SITE_OTHER): Payer: Self-pay

## 2017-01-03 NOTE — Telephone Encounter (Signed)
Scheduled pt for 01/13/17 @ 3:15 

## 2017-01-03 NOTE — Telephone Encounter (Signed)
Ok to do as eval/repeat if no new trauma etc

## 2017-01-03 NOTE — Telephone Encounter (Signed)
Pt is requesting another right hip injection. Had one 11/11/15 ok to repeat?

## 2017-01-13 ENCOUNTER — Ambulatory Visit (INDEPENDENT_AMBULATORY_CARE_PROVIDER_SITE_OTHER): Payer: BC Managed Care – PPO | Admitting: Physical Medicine and Rehabilitation

## 2017-01-13 ENCOUNTER — Encounter (INDEPENDENT_AMBULATORY_CARE_PROVIDER_SITE_OTHER): Payer: Self-pay | Admitting: Physical Medicine and Rehabilitation

## 2017-01-13 ENCOUNTER — Ambulatory Visit (INDEPENDENT_AMBULATORY_CARE_PROVIDER_SITE_OTHER): Payer: Self-pay

## 2017-01-13 DIAGNOSIS — M25551 Pain in right hip: Secondary | ICD-10-CM

## 2017-01-13 NOTE — Progress Notes (Deleted)
Right hip pain. Did well with last injection and was pain free for several months after. Increased pain in the last 2 to 3 weeks. Into groin at times and states this is mainly with bending.

## 2017-01-13 NOTE — Progress Notes (Signed)
Wesley Lawson - 61 y.o. male MRN [id number]  Date of birth: Oct 02, 1955  Office Visit Note: Visit Date: 01/13/2017 PCP: Carlena Hurl, PA-C Referred by: Carlena Hurl, PA-C  Subjective: Chief Complaint  Patient presents with  . Right Hip - Pain   HPI: Wesley Lawson is a 61 year old gentleman complaining of chronic worsening right hip and leg and groin pain. He reports that the anesthetic hip arthrogram that we performed in May 2017 gave him almost pain-free relief for several months and then some on-off pain. He reports now increasing pain in the last 2-3 weeks. The pain is into his groin at times and it's mainly with bending and twisting. He denies any left-sided complaints. He denies any specific trauma. He does get some paresthesia type symptoms at times but it's rare. He does endorse some back pain with standing. He works at [employer] and [employer]. His history is such that he was treated by his primary care provider Chana Bode [facility]. He then saw Dr. Ninfa Linden for evaluation in May of last year. He was having symptoms consistent both with potentially a back issues as well as hip. Diagnostic hip injection obviously seemed to help quite a bit and is really not seen any 1 for his pain since that time. X-rays of the hip showed significant osteoarthritis on the right hip. He had been to chiropractors as well as medication management for which he does use some Naprosyn.    Review of Systems  Constitutional: Negative for chills, fever, malaise/fatigue and weight loss.  HENT: Negative for hearing loss and sinus pain.   Eyes: Negative for blurred vision, double vision and photophobia.  Respiratory: Negative for cough and shortness of breath.   Cardiovascular: Negative for chest pain, palpitations and leg swelling.  Gastrointestinal: Negative for abdominal pain, nausea and vomiting.  Genitourinary: Negative for flank pain.  Musculoskeletal: Positive for back pain and joint pain. Negative for  myalgias.  Skin: Negative for itching and rash.  Neurological: Negative for tremors, focal weakness and weakness.  Endo/Heme/Allergies: Negative.   Psychiatric/Behavioral: Negative for depression.  All other systems reviewed and are negative.  Otherwise per HPI.  Assessment & Plan: Visit Diagnoses:  1. Pain in right hip     Plan: Findings:  Chronic worsening severe at times right hip and groin pain consistent with intra-articular hip pathology and osteoarthritis. On exam he is a very stiff right hip but it does reproduce his pain. He could be having some lumbar issues as well but the prior hip injection seemed to alleviate his symptoms quite a bit. He has had no new trauma nothing really outstanding to change her warfarin different diagnosis or workup. I will today go ahead and repeat the intra-articular hip injection on the right. Depending on his relief he would follow up with Dr. Ninfa Linden or pursue more of a lumbar workup. Otherwise medications will stay the same. He is encouraged to stay active.    Meds & Orders: No orders of the defined types were placed in this encounter.   Orders Placed This Encounter  Procedures  . Large Joint Injection/Arthrocentesis  . XR C-ARM NO REPORT    Follow-up: Return if symptoms worsen or fail to improve, for Dr. Ninfa Linden.   Procedures: Hip intra-articular injection fluoroscopic guidance Date/Time: 01/13/2017 3:36 PM Performed by: Magnus Sinning Authorized by: Magnus Sinning   Consent Given by:  Patient Site marked: the procedure site was marked   Timeout: prior to procedure the correct patient, procedure, and  site was verified   Indications:  Pain and diagnostic evaluation Location:  Hip Site:  R hip joint Prep: patient was prepped and draped in usual sterile fashion   Needle Size:  22 G Needle Length:  3.5 inches Approach:  Anterior Ultrasound Guidance: No   Fluoroscopic Guidance: Yes   Arthrogram: No   Medications:  3 mL bupivacaine  0.5 %; 80 mg triamcinolone acetonide 40 MG/ML Aspiration Attempted: Yes   Patient tolerance:  Patient tolerated the procedure well with no immediate complications  There was excellent flow of contrast producing a partial arthrogram of the hip. The patient did have relief of symptoms during the anesthetic phase of the injection.     No notes on file   Clinical History: No specialty comments available.  He reports that he has never smoked. He has never used smokeless tobacco. No results for input(s): HGBA1C, LABURIC in the last 8760 hours.  Objective:  VS:  HT:    WT:   BMI:     BP:   HR: bpm  TEMP: ( )  RESP:  Physical Exam  Constitutional: He is oriented to person, place, and time. He appears well-developed and well-nourished. No distress.  HENT:  Head: Normocephalic and atraumatic.  Eyes: Pupils are equal, round, and reactive to light. Conjunctivae are normal.  Neck: Normal range of motion. Neck supple.  Cardiovascular: Regular rhythm and intact distal pulses.   Pulmonary/Chest: Effort normal. No respiratory distress.  Musculoskeletal:  Patient ambulates without aid. He is slow to rise from a seated position with pain upon extension of the lumbar spine. He has very stiff movement of the right hip compared to left. He does lack range of motion. He does get some pain at end ranges. He has good distal strength without clonus.  Neurological: He is alert and oriented to person, place, and time. He exhibits normal muscle tone. Coordination normal.  Skin: Skin is warm and dry. No rash noted. No erythema.  Psychiatric: He has a normal mood and affect.  Nursing note and vitals reviewed.   Ortho Exam Imaging: No results found.  Past Medical/Family/Surgical/Social History: Medications & Allergies reviewed per EMR Patient Active Problem List   Diagnosis Date Noted  . Rash 10/21/2016  . Scalp pain 10/21/2016  . Herpes zoster without complication 09/32/6712  . Cold sore 10/21/2016    . Hyperlipidemia 07/30/2016  . Erectile dysfunction 07/30/2016  . Leg pain, anterior, unspecified laterality 07/30/2016  . 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 Osteoarthritis of both hips 07/30/2016  . Abnormal EKG 07/30/2016  . Right-sided low back pain without sciatica 10/13/2015  . Obesity 10/13/2015  . Essential hypertension 10/13/2015  . Decreased range of hip movement 10/13/2015  . Right hip pain 02/21/2014  . Numbness of left hand 02/07/2014  . Pain of right thumb 02/07/2014   Past Medical History:  Diagnosis Date  . Abnormal EKG   . Arthritis    hands, knees  . Chest pain    a. normal echo stress test in 2010 with mild to moderate LVH.   Marland Kitchen Dyslipidemia   . GERD (gastroesophageal reflux disease)   . Hypertension    Family History  Problem Relation Age of Onset  . Diabetes Mother   . Stroke Mother   . Diabetes Sister   . Colon polyps Neg Hx   . Esophageal cancer Neg Hx   . Rectal cancer Neg Hx   . Stomach cancer Neg Hx    Past Surgical History:  Procedure Laterality Date  . WISDOM TOOTH EXTRACTION     Social History   Occupational History  . Not on file.   Social History Main Topics  . Smoking status: Never Smoker  . Smokeless tobacco: Never Used  . Alcohol use 1.8 oz/week    3 Cans of beer per week  . Drug use: No  . Sexual activity: Yes

## 2017-01-13 NOTE — Patient Instructions (Signed)

## 2017-01-13 NOTE — Progress Notes (Unsigned)
Fluoro Time:  23 sec Mgy: 20.28

## 2017-01-17 ENCOUNTER — Encounter (INDEPENDENT_AMBULATORY_CARE_PROVIDER_SITE_OTHER): Payer: Self-pay | Admitting: Physical Medicine and Rehabilitation

## 2017-01-17 MED ORDER — BUPIVACAINE HCL 0.5 % IJ SOLN
3.0000 mL | INTRAMUSCULAR | Status: AC | PRN
  Administered 2017-01-13: 3 mL via INTRA_ARTICULAR

## 2017-01-17 MED ORDER — TRIAMCINOLONE ACETONIDE 40 MG/ML IJ SUSP
80.0000 mg | INTRAMUSCULAR | Status: AC | PRN
  Administered 2017-01-13: 80 mg via INTRA_ARTICULAR

## 2017-01-30 ENCOUNTER — Other Ambulatory Visit: Payer: Self-pay | Admitting: Medical

## 2017-05-14 ENCOUNTER — Other Ambulatory Visit: Payer: Self-pay | Admitting: Medical

## 2017-05-16 ENCOUNTER — Other Ambulatory Visit: Payer: Self-pay | Admitting: Family Medicine

## 2017-05-16 ENCOUNTER — Other Ambulatory Visit: Payer: Self-pay

## 2017-05-16 MED ORDER — LISINOPRIL-HYDROCHLOROTHIAZIDE 20-25 MG PO TABS: 1.0000 | ORAL_TABLET | Freq: Every day | ORAL | 0 refills | Status: DC

## 2017-05-16 NOTE — Telephone Encounter (Signed)
Sent to pharmacy 

## 2017-05-16 NOTE — Telephone Encounter (Signed)
Pt called and made an appt for 06/13/2017. Please refill lisinopril to [address]

## 2017-06-13 ENCOUNTER — Encounter: Payer: Self-pay | Admitting: Medical

## 2017-06-13 ENCOUNTER — Ambulatory Visit (INDEPENDENT_AMBULATORY_CARE_PROVIDER_SITE_OTHER): Payer: BC Managed Care – PPO | Admitting: Medical

## 2017-06-13 ENCOUNTER — Encounter: Payer: Self-pay | Admitting: Gastroenterology

## 2017-06-13 VITALS — BP 126/80 | HR 80 | Ht 69.5 in | Wt 239.0 lb

## 2017-06-13 DIAGNOSIS — Z23 Encounter for immunization: Secondary | ICD-10-CM | POA: Diagnosis not present

## 2017-06-13 DIAGNOSIS — N529 Male erectile dysfunction, unspecified: Secondary | ICD-10-CM

## 2017-06-13 DIAGNOSIS — M6289 Other specified disorders of muscle: Secondary | ICD-10-CM | POA: Diagnosis not present

## 2017-06-13 DIAGNOSIS — E785 Hyperlipidemia, unspecified: Secondary | ICD-10-CM | POA: Diagnosis not present

## 2017-06-13 DIAGNOSIS — Z125 Encounter for screening for malignant neoplasm of prostate: Secondary | ICD-10-CM | POA: Diagnosis not present

## 2017-06-13 DIAGNOSIS — E669 Obesity, unspecified: Secondary | ICD-10-CM | POA: Diagnosis not present

## 2017-06-13 DIAGNOSIS — M160 Bilateral primary osteoarthritis of hip: Secondary | ICD-10-CM | POA: Diagnosis not present

## 2017-06-13 DIAGNOSIS — Z1211 Encounter for screening for malignant neoplasm of colon: Secondary | ICD-10-CM | POA: Diagnosis not present

## 2017-06-13 DIAGNOSIS — Z0000 Encounter for general adult medical examination without abnormal findings: Secondary | ICD-10-CM | POA: Insufficient documentation

## 2017-06-13 DIAGNOSIS — I10 Essential (primary) hypertension: Secondary | ICD-10-CM

## 2017-06-13 DIAGNOSIS — Z1159 Encounter for screening for other viral diseases: Secondary | ICD-10-CM

## 2017-06-13 DIAGNOSIS — Z131 Encounter for screening for diabetes mellitus: Secondary | ICD-10-CM | POA: Insufficient documentation

## 2017-06-13 LAB — POCT URINALYSIS DIP (PROADVANTAGE DEVICE)
BILIRUBIN UA: NEGATIVE mg/dL
Bilirubin, UA: NEGATIVE
Blood, UA: NEGATIVE
GLUCOSE UA: NEGATIVE mg/dL
LEUKOCYTES UA: NEGATIVE
Nitrite, UA: NEGATIVE
PROTEIN UA: NEGATIVE mg/dL
SPECIFIC GRAVITY, URINE: 1.03
Urobilinogen, Ur: NEGATIVE
pH, UA: 6 (ref 5.0–8.0)

## 2017-06-13 MED ORDER — LISINOPRIL-HYDROCHLOROTHIAZIDE 20-25 MG PO TABS: 1.0000 | ORAL_TABLET | Freq: Every day | ORAL | 3 refills | Status: DC

## 2017-06-13 NOTE — Addendum Note (Signed)
Addended by: Tyrone Apple on: 06/13/2017 10:11 AM   Modules accepted: Orders

## 2017-06-13 NOTE — Patient Instructions (Signed)
  Thank you for giving me the opportunity to serve you today.    Your diagnosis today includes: Encounter Diagnoses  Name Primary?  . Encounter for health maintenance examination in adult Yes  . Essential hypertension   . Erectile dysfunction, unspecified erectile dysfunction type   . Obesity with serious comorbidity, unspecified classification, unspecified obesity type   . Hyperlipidemia, unspecified hyperlipidemia type   . Osteoarthritis of both hips, unspecified osteoarthritis type   . Screen for colon cancer   . Screening for prostate cancer   . Encounter for hepatitis C screening test for low risk patient   . Need for Tdap vaccination   . Muscle tightness     Recommendations:  Eat a healthy low fat diet  Exercise most days per week, at least 30 minutes or more  Begin working on a daily stretching routine.     Work on losing 20lb in the next 3 months  See your eye doctor yearly for routine vision care.  See your dentist yearly for routine dental care including hygiene visits twice yearly.  We updated your Tdap (tetanus, diptheria and pertussis vaccine today)  I recommend a yearly Influenza/Flu vaccine, typically in September to help reduce the risk of you and others getting the flu illness  We will call with lab results and appointment information for colonoscopy  See me yearly for a physical

## 2017-06-13 NOTE — Progress Notes (Signed)
Subjective:   HPI  Wesley Lawson is a 61 y.o. male who presents for physical Chief Complaint  Patient presents with  . Annual Exam    physical, hip and leg pain     Medical care team includes: Bellatrix Devonshire, Camelia Eng, PA-C here for primary care Dentist Eye doctor Orthopedics  Concerns: Hip pains, groin pains at times.   No specific stretching or exercise other than at work.   He has seen ortho in the past for hips, had injections, and hx/o physical therapy this past year for hip pain.  He has chronic tightness in his muscles  Reviewed their medical, surgical, family, social, medication, and allergy history and updated chart as appropriate.  Past Medical History:  Diagnosis Date  . Abnormal EKG   . Arthritis    hands, knees  . Chest pain    a. normal echo stress test in 2010 with mild to moderate LVH.   Marland Kitchen Dyslipidemia   . GERD (gastroesophageal reflux disease)   . Hypertension     Past Surgical History:  Procedure Laterality Date  . COLONOSCOPY     never as of 05/2017, pending consult  . WISDOM TOOTH EXTRACTION      Social History   Socioeconomic History  . Marital status: Married    Spouse name: Not on file  . Number of children: Not on file  . Years of education: Not on file  . Highest education level: Not on file  Social Needs  . Financial resource strain: Not on file  . Food insecurity - worry: Not on file  . Food insecurity - inability: Not on file  . Transportation needs - medical: Not on file  . Transportation needs - non-medical: Not on file  Occupational History  . Not on file  Tobacco Use  . Smoking status: Never Smoker  . Smokeless tobacco: Never Used  Substance and Sexual Activity  . Alcohol use: Yes    Alcohol/week: 1.8 oz    Types: 3 Cans of beer per week  . Drug use: No  . Sexual activity: Yes  Other Topics Concern  . Not on file  Social History Narrative   Married, 2 child [place] and [place], part time [place], full time at [employer].   Exercise - on the job with activity.   05/2017    Family History  Problem Relation Age of Onset  . Diabetes Mother   . Stroke Mother   . Diabetes Sister   . Asthma Daughter   . Colon polyps Neg Hx   . Esophageal cancer Neg Hx   . Rectal cancer Neg Hx   . Stomach cancer Neg Hx   . Cancer Neg Hx   . Heart disease Neg Hx      Current Outpatient Medications:  .  aspirin EC 81 MG tablet, Take 1 tablet (81 mg total) by mouth daily., Disp: 90 tablet, Rfl: 3 .  ibuprofen (ADVIL,MOTRIN) 800 MG tablet, Take 1 tablet (800 mg total) by mouth every 8 (eight) hours as needed., Disp: 30 tablet, Rfl: 0 .  lisinopril-hydrochlorothiazide (PRINZIDE,ZESTORETIC) 20-25 MG tablet, Take 1 tablet by mouth daily., Disp: 30 tablet, Rfl: 0 .  sildenafil (REVATIO) 20 MG tablet, 1-5 tablets (20 mg to 100 mg) prior to sexual activity daily prn, Disp: 50 tablet, Rfl: 2  No Known Allergies     Review of Systems Constitutional: -fever, -chills, -sweats, -unexpected weight change, -decreased appetite, -fatigue Allergy: -sneezing, -itching, -congestion Dermatology: -changing moles, --rash, -lumps ENT: -  runny nose, -ear pain, -sore throat, -hoarseness, -sinus pain, -teeth pain, - ringing in ears, -hearing loss, -nosebleeds Cardiology: -chest pain, -palpitations, -swelling, -difficulty breathing when lying flat, -waking up short of breath Respiratory: -cough, -shortness of breath, -difficulty breathing with exercise or exertion, -wheezing, -coughing up blood Gastroenterology: -abdominal pain, -nausea, -vomiting, -diarrhea, -constipation, -blood in stool, -changes in bowel movement, -difficulty swallowing or eating Hematology: -bleeding, -bruising  Musculoskeletal: +joint aches, -muscle aches, -joint swelling, -back pain, -neck pain, -cramping, -changes in gait Ophthalmology: denies vision changes, eye redness, itching, discharge Urology: -burning with urination, -difficulty urinating, -blood in urine, -urinary  frequency, -urgency, -incontinence Neurology: -headache, -weakness, -tingling, -numbness, -memory loss, -falls, -dizziness Psychology: -depressed mood, -agitation, -sleep problems     Objective:   BP 126/80   Pulse 80   Ht 5' 9.5" (1.765 m)   Wt 239 lb (108.4 kg)   SpO2 95%   BMI 34.79 kg/m   General appearance: alert, no distress, WD/WN, obese African American male Skin: few skin tags of neck, no worrisome lesions HEENT: normocephalic, conjunctiva/corneas normal, sclerae anicteric, PERRLA, EOMi, nares patent, no discharge or erythema, pharynx normal Oral cavity: MMM, tongue normal, teeth in good repair Neck: supple, no lymphadenopathy, no thyromegaly, no masses, normal ROM, no bruits Chest: non tender, normal shape and expansion Heart: RRR, normal S1, S2, no murmurs Lungs: CTA bilaterally, no wheezes, rhonchi, or rales Abdomen: +bs, soft, non tender, non distended, no masses, no hepatomegaly, no splenomegaly, no bruits Back: non tender, normal ROM, no scoliosis Musculoskeletal: tightness in bilat hamstrings, mildly reduced ROM of both hips, otherwise upper extremities non tender, no obvious deformity, normal ROM throughout, lower extremities non tender, no obvious deformity, normal ROM throughout Extremities: no edema, no cyanosis, no clubbing Pulses: 2+ symmetric, upper and lower extremities, normal cap refill Neurological: alert, oriented x 3, CN2-12 intact, strength normal upper extremities and lower extremities, sensation normal throughout, DTRs 2+ throughout, no cerebellar signs, gait normal Psychiatric: normal affect, behavior normal, pleasant  GU: normal male external genitalia,circumcised, left testicle with atrophy compared to right, otherwise non tender, no masses, no hernia, no lymphadenopathy Rectal: deferred   Assessment and Plan :    Encounter Diagnoses  Name Primary?  . Encounter for health maintenance examination in adult Yes  . Essential hypertension   .  Erectile dysfunction, unspecified erectile dysfunction type   . Obesity with serious comorbidity, unspecified classification, unspecified obesity type   . Hyperlipidemia, unspecified hyperlipidemia type   . Osteoarthritis of both hips, unspecified osteoarthritis type   . Screen for colon cancer   . Screening for prostate cancer   . Encounter for hepatitis C screening test for low risk patient   . Need for Tdap vaccination   . Muscle tightness     Physical exam - discussed and counseled on healthy lifestyle, diet, exercise, preventative care, vaccinations, sick and well care, proper use of emergency dept and after hours care, and addressed their concerns.    Health screening: See your eye doctor yearly for routine vision care. See your dentist yearly for routine dental care including hygiene visits twice yearly.  Cancer screening Discussed colonoscopy screening, referral for first colonoscopy. Discussed PSA, prostate exam, and prostate cancer screening risks/benefits.   Discussed prostate symptoms as well.  Prostate screening performed: Yes  Vaccinations: Counseled on the following vaccines:  influenza and tdap Counseled on the Tdap (tetanus, diptheria, and acellular pertussis) vaccine.  Vaccine information sheet given. Tdap vaccine given after consent obtained. Declines flu shot  bilat  hip OA and tight hamstrings - discussed having a stretching and exercise routine, suggestive you tube yoga videos, yoga class, tae kwon do class, etc.  hTN - c/t same medication  Hyperlipidemia - counseled on risk factors, risks/benefits of medication   Kaelan was seen today for annual exam.  Diagnoses and all orders for this visit:  Encounter for health maintenance examination in adult -     Comprehensive metabolic panel -     Lipid panel -     CBC -     Hemoglobin A1c -     PSA -     Hepatitis C antibody -     HIV antibody  Essential hypertension  Erectile dysfunction, unspecified  erectile dysfunction type  Obesity with serious comorbidity, unspecified classification, unspecified obesity type  Hyperlipidemia, unspecified hyperlipidemia type  Osteoarthritis of both hips, unspecified osteoarthritis type  Screen for colon cancer -     Ambulatory referral to Gastroenterology  Screening for prostate cancer  Encounter for hepatitis C screening test for low risk patient -     Hepatitis C antibody  Need for Tdap vaccination  Muscle tightness   Follow-up pending labs, yearly for physical

## 2017-06-13 NOTE — Addendum Note (Signed)
Addended by: Carlena Hurl on: 06/13/2017 09:34 AM   Modules accepted: Orders

## 2017-06-14 LAB — COMPREHENSIVE METABOLIC PANEL
AG RATIO: 1.8 (calc) (ref 1.0–2.5)
ALBUMIN MSPROF: 4.7 g/dL (ref 3.6–5.1)
ALKALINE PHOSPHATASE (APISO): 73 U/L (ref 40–115)
ALT: 25 U/L (ref 9–46)
AST: 16 U/L (ref 10–35)
BILIRUBIN TOTAL: 0.7 mg/dL (ref 0.2–1.2)
BUN: 16 mg/dL (ref 7–25)
CHLORIDE: 102 mmol/L (ref 98–110)
CO2: 28 mmol/L (ref 20–32)
CREATININE: 1.23 mg/dL (ref 0.70–1.25)
Calcium: 9.7 mg/dL (ref 8.6–10.3)
GLOBULIN: 2.6 g/dL (ref 1.9–3.7)
Glucose, Bld: 108 mg/dL — ABNORMAL HIGH (ref 65–99)
POTASSIUM: 4 mmol/L (ref 3.5–5.3)
Sodium: 138 mmol/L (ref 135–146)
Total Protein: 7.3 g/dL (ref 6.1–8.1)

## 2017-06-14 LAB — LIPID PANEL
CHOL/HDL RATIO: 6.2 (calc) — AB (ref <5.0)
CHOLESTEROL: 222 mg/dL — AB (ref <200)
HDL: 36 mg/dL — AB (ref >40)
LDL Cholesterol (Calc): 145 mg/dL (calc) — ABNORMAL HIGH
Non-HDL Cholesterol (Calc): 186 mg/dL (calc) — ABNORMAL HIGH (ref <130)
Triglycerides: 268 mg/dL — ABNORMAL HIGH (ref <150)

## 2017-06-14 LAB — CBC
HCT: 44.1 % (ref 38.5–50.0)
HEMOGLOBIN: 14.8 g/dL (ref 13.2–17.1)
MCH: 29.2 pg (ref 27.0–33.0)
MCHC: 33.6 g/dL (ref 32.0–36.0)
MCV: 87.2 fL (ref 80.0–100.0)
MPV: 11.3 fL (ref 7.5–12.5)
PLATELETS: 201 10*3/uL (ref 140–400)
RBC: 5.06 10*6/uL (ref 4.20–5.80)
RDW: 13.4 % (ref 11.0–15.0)
WBC: 4.6 10*3/uL (ref 3.8–10.8)

## 2017-06-14 LAB — HEPATITIS C ANTIBODY
Hepatitis C Ab: NONREACTIVE
SIGNAL TO CUT-OFF: 0.04 (ref <1.00)

## 2017-06-14 LAB — HEMOGLOBIN A1C
HEMOGLOBIN A1C: 5.6 %{Hb} (ref <5.7)
Mean Plasma Glucose: 114 (calc)
eAG (mmol/L): 6.3 (calc)

## 2017-06-14 LAB — PSA: PSA: 0.9 ng/mL (ref <=4.0)

## 2017-06-14 LAB — HIV ANTIBODY (ROUTINE TESTING W REFLEX): HIV 1&2 Ab, 4th Generation: NONREACTIVE

## 2017-06-15 ENCOUNTER — Other Ambulatory Visit: Payer: Self-pay | Admitting: Medical

## 2017-06-15 MED ORDER — PRAVASTATIN SODIUM 20 MG PO TABS: 20.0000 mg | ORAL_TABLET | Freq: Every evening | ORAL | 0 refills | Status: DC

## 2017-06-28 ENCOUNTER — Telehealth (INDEPENDENT_AMBULATORY_CARE_PROVIDER_SITE_OTHER): Payer: Self-pay | Admitting: Physical Medicine and Rehabilitation

## 2017-06-29 NOTE — Telephone Encounter (Signed)
Yes we have done two right hip injections, please confirm?

## 2017-06-30 NOTE — Telephone Encounter (Signed)
Patient wants to be evaluated for his left hip. OV?

## 2017-06-30 NOTE — Telephone Encounter (Signed)
Scheduled for left hip injection.

## 2017-06-30 NOTE — Telephone Encounter (Signed)
No, ok for left hip then follow up with Dr. Ninfa Linden, saw him in 2017

## 2017-07-01 ENCOUNTER — Ambulatory Visit (INDEPENDENT_AMBULATORY_CARE_PROVIDER_SITE_OTHER): Payer: BC Managed Care – PPO | Admitting: Physical Medicine and Rehabilitation

## 2017-07-01 ENCOUNTER — Encounter (INDEPENDENT_AMBULATORY_CARE_PROVIDER_SITE_OTHER): Payer: Self-pay | Admitting: Physical Medicine and Rehabilitation

## 2017-07-01 ENCOUNTER — Ambulatory Visit (INDEPENDENT_AMBULATORY_CARE_PROVIDER_SITE_OTHER): Payer: BC Managed Care – PPO

## 2017-07-01 DIAGNOSIS — M25552 Pain in left hip: Secondary | ICD-10-CM

## 2017-07-01 NOTE — Progress Notes (Signed)
Wesley Lawson - 62 y.o. male MRN [id number]  Date of birth: July 10, 1955  Office Visit Note: Visit Date: 07/01/2017 PCP: Carlena Hurl, PA-C Referred by: Carlena Hurl, PA-C  Subjective: Chief Complaint  Patient presents with  . Left Thigh - Pain  . Left Hip - Pain   HPI: Wesley Lawson is a 62 year old gentleman with known left hip osteoarthritis who saw Dr. Ninfa Linden in 2017 and we have seen him on 2 occasions for intra-articular hip injections.  These were completed in April 2017 in August 2018.  He returns today with 3 weeks of increasing left hip and groin pain.  Prior injections were completed on the right side.  He did have known osteoarthritis of both sides.  According to Dr. Trevor Mace notes of the time he could have had injections on either side depending on symptomatology.  The patient is not really interested in any hip replacements at this point although it is something he is considering.  He has been using ibuprofen and Biofreeze and he has been followed by his primary care physician.  He reports the right side is been doing okay is just stiff.  The left side is really been problematic and has been over the years is not as bad as the right.  He has had no new trauma otherwise.    Review of Systems  Constitutional: Negative for chills, fever, malaise/fatigue and weight loss.  HENT: Negative for hearing loss and sinus pain.   Eyes: Negative for blurred vision, double vision and photophobia.  Respiratory: Negative for cough and shortness of breath.   Cardiovascular: Negative for chest pain, palpitations and leg swelling.  Gastrointestinal: Negative for abdominal pain, nausea and vomiting.  Genitourinary: Negative for flank pain.  Musculoskeletal: Positive for back pain and joint pain. Negative for myalgias.  Skin: Negative for itching and rash.  Neurological: Negative for tremors, focal weakness and weakness.  Endo/Heme/Allergies: Negative.   Psychiatric/Behavioral: Negative  for depression.  All other systems reviewed and are negative.  Otherwise per HPI.  Assessment & Plan: Visit Diagnoses:  1. Pain in left hip     Plan: Findings:  Diagnostic and hopefully therapeutic left anesthetic hip arthrogram.  Patient did have relief during the anesthetic phase of the injection.  Prior to any other injections patient will follow up with Dr. Ninfa Linden.    Meds & Orders: No orders of the defined types were placed in this encounter.   Orders Placed This Encounter  Procedures  . Large Joint Inj: L hip joint  . XR C-ARM NO REPORT    Follow-up: Return if symptoms worsen or fail to improve, for Dr. Ninfa Linden.   Procedures: Large Joint Inj: L hip joint on 07/01/2017 10:45 AM Indications: pain and diagnostic evaluation Details: 22 G needle, anterior approach  Arthrogram: Yes  Medications: 80 mg triamcinolone acetonide 40 MG/ML; 3 mL bupivacaine 0.5 % Outcome: tolerated well, no immediate complications  Arthrogram demonstrated excellent flow of contrast throughout the joint surface without extravasation or obvious defect.  The patient had relief of symptoms during the anesthetic phase of the injection.  Procedure, treatment alternatives, risks and benefits explained, specific risks discussed. Consent was given by the patient. Immediately prior to procedure a time out was called to verify the correct patient, procedure, equipment, support staff and site/side marked as required. Patient was prepped and draped in the usual sterile fashion.      No notes on file   Clinical History: No specialty comments available.  He  reports that  has never smoked. he has never used smokeless tobacco.  Recent Labs    06/13/17 0932  HGBA1C 5.6    Objective:  VS:  HT:    WT:   BMI:     BP:   HR: bpm  TEMP: ( )  RESP:  Physical Exam  Musculoskeletal:  Painful range of motion of the left hip with concordant pain into the groin.    Ortho Exam Imaging: No results  found.  Past Medical/Family/Surgical/Social History: Medications & Allergies reviewed per EMR Patient Active Problem List   Diagnosis Date Noted  . Screen for colon cancer 06/13/2017  . Screening for prostate cancer 06/13/2017  . Encounter for health maintenance examination in adult 06/13/2017  . Muscle tightness 06/13/2017  . Rash 10/21/2016  . Herpes zoster without complication 47/42/5956  . Cold sore 10/21/2016  . Hyperlipidemia 07/30/2016  . Erectile dysfunction 07/30/2016  . Leg pain, anterior, unspecified laterality 07/30/2016  . 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 Osteoarthritis of both hips 07/30/2016  . Abnormal EKG 07/30/2016  . Right-sided low back pain without sciatica 10/13/2015  . Obesity 10/13/2015  . Essential hypertension 10/13/2015  . Decreased range of hip movement 10/13/2015  . Right hip pain 02/21/2014  . Numbness of left hand 02/07/2014  . Pain of right thumb 02/07/2014   Past Medical History:  Diagnosis Date  . Abnormal EKG   . Allergy   . Arthritis    hands, knees  . Chest pain    a. normal echo stress test in 2010 with mild to moderate LVH.   Marland Kitchen Dyslipidemia   . GERD (gastroesophageal reflux disease)   . Hyperlipidemia   . Hypertension    Family History  Problem Relation Age of Onset  . Diabetes Mother   . Stroke Mother   . Diabetes Sister   . Asthma Daughter   . Colon polyps Neg Hx   . Esophageal cancer Neg Hx   . Rectal cancer Neg Hx   . Stomach cancer Neg Hx   . Cancer Neg Hx   . Heart disease Neg Hx   . Colon cancer Neg Hx   . Liver cancer Neg Hx   . Pancreatic cancer Neg Hx    Past Surgical History:  Procedure Laterality Date  . COLONOSCOPY     never as of 05/2017, pending consult  . WISDOM TOOTH EXTRACTION     Social History   Occupational History  . Not on file  Tobacco Use  . Smoking status: Never Smoker  . Smokeless tobacco: Never Used  Substance and Sexual Activity  . Alcohol use: Yes    Alcohol/week: 1.8  oz    Types: 3 Cans of beer per week  . Drug use: No  . Sexual activity: Yes

## 2017-07-01 NOTE — Patient Instructions (Signed)
Can try Glucosamine and or Arrey Discharge Instructions  *At any time if you have questions or concerns they can be answered by calling [phone]  All Patients: . You may experience an increase in your symptoms for the first 2 days (it can take 2 days to 2 weeks for the steroid/cortisone to have its maximal effect). . You may use ice to the site for the first 24 hours; 20 minutes on and 20 minutes off and may use heat after that time. . You may resume and continue your current pain medications. If you need a refill please contact the prescribing physician. . You may resume her regular medications if any were stopped for the procedure. . You may shower but no swimming, tub bath or Jacuzzi for 24 hours. . Please remove bandage after 4 hours. . You may resume light activities as tolerated. . If you had Spine Injection, you should not drive for the next 3 hours due to anesthetics used in the procedure. Please have someone drive for you.  *If you have had sedation, Valium, Xanax, or lorazepam: Do not drive or use public transportation for 24 hours, do not operating hazardous machinery or make important personal/business decisions for 24 hours.  POSSIBLE STEROID SIDE EFFECTS: If experienced these should only last for a short period. Change in menstrual flow  Edema in (swelling)  Increased appetite Skin flushing (redness)  Skin rash/acne  Thrush (oral) Vaginitis    Increased sweating  Depression Increased blood glucose levels Cramping and leg/calf  Euphoria (feeling happy)  POSSIBLE PROCEDURE SIDE EFFECTS: Please call our office if concerned. Increased pain Increased numbness/tingling  Headache Nausea/vomiting Hematoma (bruising/bleeding) Edema (swelling at the site) Weakness  Infection (red/drainage at site) Fever greater than 100.20F  *In the event of a headache after epidural steroid injection: Drink plenty of fluids, especially water and try to lay flat  when possible. If the headache does not get better after a few days or as always if concerned please call the office.

## 2017-07-01 NOTE — Progress Notes (Deleted)
Pt states a dull constant pain left hip that radiates to left thigh. Pt also states pain in left groin. Pt states pain has been going on for 3 weeks. Pt states work, getting in and out of the truck makes it worse, ibuprofen and biofreeze eases pain. -Dye Allergies.

## 2017-07-07 ENCOUNTER — Other Ambulatory Visit: Payer: Self-pay

## 2017-07-07 ENCOUNTER — Ambulatory Visit (AMBULATORY_SURGERY_CENTER): Payer: Self-pay

## 2017-07-07 VITALS — Ht 70.0 in | Wt 236.0 lb

## 2017-07-07 DIAGNOSIS — Z1211 Encounter for screening for malignant neoplasm of colon: Secondary | ICD-10-CM

## 2017-07-07 MED ORDER — NA SULFATE-K SULFATE-MG SULF 17.5-3.13-1.6 GM/177ML PO SOLN: 1.0000 | Freq: Once | ORAL | 0 refills | Status: AC

## 2017-07-07 NOTE — Progress Notes (Signed)
Denies allergies to eggs or soy products. Denies complication of anesthesia or sedation. Denies use of weight loss medication. Denies use of O2.   Emmi instructions declined.  

## 2017-07-12 MED ORDER — BUPIVACAINE HCL 0.5 % IJ SOLN
3.0000 mL | INTRAMUSCULAR | Status: AC | PRN
  Administered 2017-07-01: 3 mL via INTRA_ARTICULAR

## 2017-07-12 MED ORDER — TRIAMCINOLONE ACETONIDE 40 MG/ML IJ SUSP
80.0000 mg | INTRAMUSCULAR | Status: AC | PRN
  Administered 2017-07-01: 80 mg via INTRA_ARTICULAR

## 2017-07-21 ENCOUNTER — Encounter: Payer: BC Managed Care – PPO | Admitting: Medical

## 2017-07-21 ENCOUNTER — Ambulatory Visit (AMBULATORY_SURGERY_CENTER): Admitting: Gastroenterology

## 2017-07-21 ENCOUNTER — Other Ambulatory Visit: Payer: Self-pay

## 2017-07-21 ENCOUNTER — Encounter: Payer: Self-pay | Admitting: Gastroenterology

## 2017-07-21 VITALS — BP 109/74 | HR 71 | Temp 99.6°F | Resp 12 | Ht 70.0 in | Wt 236.0 lb

## 2017-07-21 DIAGNOSIS — Z1212 Encounter for screening for malignant neoplasm of rectum: Secondary | ICD-10-CM

## 2017-07-21 DIAGNOSIS — Z1211 Encounter for screening for malignant neoplasm of colon: Secondary | ICD-10-CM

## 2017-07-21 DIAGNOSIS — D122 Benign neoplasm of ascending colon: Secondary | ICD-10-CM

## 2017-07-21 HISTORY — PX: COLONOSCOPY: SHX174

## 2017-07-21 MED ORDER — SODIUM CHLORIDE 0.9 % IV SOLN
500.0000 mL | Freq: Once | INTRAVENOUS | Status: DC
Start: 2017-07-21 — End: 2021-02-17

## 2017-07-21 NOTE — Op Note (Signed)
[facility] Patient Name: Wesley Lawson Procedure Date: 07/21/2017 11:14 AM MRN: [id number] Endoscopist: Mallie Mussel L. Loletha Carrow , MD Age: 62 Referring MD:  Date of Birth: 1955/10/05 Gender: Male Account #: [id number] Procedure:                Colonoscopy Indications:              Screening for colorectal malignant neoplasm, This                            is the patient's first colonoscopy Medicines:                Monitored Anesthesia Care Procedure:                Pre-Anesthesia Assessment:                           - Prior to the procedure, a History and Physical                            was performed, and patient medications and                            allergies were reviewed. The patient's tolerance of                            previous anesthesia was also reviewed. The risks                            and benefits of the procedure and the sedation                            options and risks were discussed with the patient.                            All questions were answered, and informed consent                            was obtained. Prior Anticoagulants: The patient has                            taken no previous anticoagulant or antiplatelet                            agents. ASA Grade Assessment: II - A patient with                            mild systemic disease. After reviewing the risks                            and benefits, the patient was deemed in                            satisfactory condition to undergo the procedure.  After obtaining informed consent, the colonoscope                            was passed under direct vision. Throughout the                            procedure, the patient's blood pressure, pulse, and                            oxygen saturations were monitored continuously. The                            Colonoscope was introduced through the anus and                            advanced to the the cecum,  identified by                            appendiceal orifice and ileocecal valve. The                            colonoscopy was performed without difficulty. The                            patient tolerated the procedure well. The quality                            of the bowel preparation was excellent. The                            ileocecal valve, appendiceal orifice, and rectum                            were photographed. The quality of the bowel                            preparation was evaluated using the BBPS [facility]                            Bowel Preparation Scale) with scores of: Right                            Colon = 3, Transverse Colon = 3 and Left Colon = 3                            (entire mucosa seen well with no residual staining,                            small fragments of stool or opaque liquid). The                            total BBPS score equals 9. The bowel preparation  used was SUPREP. Scope In: 11:24:43 AM Scope Out: 11:37:26 AM Scope Withdrawal Time: 0 hours 8 minutes 44 seconds  Total Procedure Duration: 0 hours 12 minutes 43 seconds  Findings:                 The perianal and digital rectal examinations were                            normal.                           A 4 mm polyp was found in the ascending colon. The                            polyp was sessile. The polyp was removed with a                            cold snare. Resection and retrieval were complete.                           The sigmoid colon was redundant, causing scope                            looping. Abdominal pressure applied to assist scope                            passage.                           The exam was otherwise without abnormality on                            direct and retroflexion views. Complications:            No immediate complications. Estimated Blood Loss:     Estimated blood loss: none. Impression:               - One 4 mm  polyp in the ascending colon, removed                            with a cold snare. Resected and retrieved.                           - Redundant colon.                           - The examination was otherwise normal on direct                            and retroflexion views. Recommendation:           - Patient has a contact number available for                            emergencies. The signs and symptoms of potential  delayed complications were discussed with the                            patient. Return to normal activities tomorrow.                            Written discharge instructions were provided to the                            patient.                           - Resume previous diet.                           - Continue present medications.                           - Await pathology results.                           - Repeat colonoscopy is recommended for                            surveillance. The colonoscopy date will be                            determined after pathology results from today's                            exam become available for review. Mordche Hedglin L. Loletha Carrow, MD 07/21/2017 11:44:02 AM This report has been signed electronically.

## 2017-07-21 NOTE — Progress Notes (Signed)
Report given to PACU, vss 

## 2017-07-21 NOTE — Patient Instructions (Signed)
YOU HAD AN ENDOSCOPIC PROCEDURE TODAY AT THE [facility]:   Refer to the procedure report that was given to you for any specific questions about what was found during the examination.  If the procedure report does not answer your questions, please call your gastroenterologist to clarify.  If you requested that your care partner not be given the details of your procedure findings, then the procedure report has been included in a sealed envelope for you to review at your convenience later.  YOU SHOULD EXPECT: Some feelings of bloating in the abdomen. Passage of more gas than usual.  Walking can help get rid of the air that was put into your GI tract during the procedure and reduce the bloating. If you had a lower endoscopy (such as a colonoscopy or flexible sigmoidoscopy) you may notice spotting of blood in your stool or on the toilet paper. If you underwent a bowel prep for your procedure, you may not have a normal bowel movement for a few days.  Please Note:  You might notice some irritation and congestion in your nose or some drainage.  This is from the oxygen used during your procedure.  There is no need for concern and it should clear up in a day or so.  SYMPTOMS TO REPORT IMMEDIATELY:   Following lower endoscopy (colonoscopy or flexible sigmoidoscopy):  Excessive amounts of blood in the stool  Significant tenderness or worsening of abdominal pains  Swelling of the abdomen that is new, acute  Fever of 100F or higher   For urgent or emergent issues, a gastroenterologist can be reached at any hour by calling [phone].   DIET:  We do recommend a small meal at first, but then you may proceed to your regular diet.  Drink plenty of fluids but you should avoid alcoholic beverages for 24 hours.  ACTIVITY:  You should plan to take it easy for the rest of today and you should NOT DRIVE or use heavy machinery until tomorrow (because of the sedation medicines used during the test).     FOLLOW UP: Our staff will call the number listed on your records the next business day following your procedure to check on you and address any questions or concerns that you may have regarding the information given to you following your procedure. If we do not reach you, we will leave a message.  However, if you are feeling well and you are not experiencing any problems, there is no need to return our call.  We will assume that you have returned to your regular daily activities without incident.  If any biopsies were taken you will be contacted by phone or by letter within the next 1-3 weeks.  Please call us at [phone] if you have not heard about the biopsies in 3 weeks.    SIGNATURES/CONFIDENTIALITY: You and/or your care partner have signed paperwork which will be entered into your electronic medical record.  These signatures attest to the fact that that the information above on your After Visit Summary has been reviewed and is understood.  Full responsibility of the confidentiality of this discharge information lies with you and/or your care-partner.  Thank-you for choosing us for your healthcare needs today. 

## 2017-07-21 NOTE — Progress Notes (Signed)
Pt's states no medical or surgical changes since previsit or office visit. 

## 2017-07-21 NOTE — Progress Notes (Signed)
Called to room to assist during endoscopic procedure.  Patient ID and intended procedure confirmed with present staff. Received instructions for my participation in the procedure from the performing physician.  

## 2017-07-22 ENCOUNTER — Telehealth: Payer: Self-pay

## 2017-07-22 ENCOUNTER — Telehealth: Payer: Self-pay | Admitting: *Deleted

## 2017-07-22 NOTE — Telephone Encounter (Signed)
No answer for post procedure call back. Left message for patient to call with questions or concerns. SM 

## 2017-07-22 NOTE — Telephone Encounter (Signed)
  Follow up Call-  Call back number 07/21/2017  Post procedure Call Back phone  # [phone]  Permission to leave phone message Yes  Some recent data might be hidden     [facility] Wesley Lawson/follow-up call

## 2017-07-29 ENCOUNTER — Encounter: Payer: Self-pay | Admitting: Gastroenterology

## 2017-07-30 ENCOUNTER — Encounter (HOSPITAL_COMMUNITY): Payer: Self-pay | Admitting: Emergency Medicine

## 2017-07-30 DIAGNOSIS — Z7982 Long term (current) use of aspirin: Secondary | ICD-10-CM | POA: Insufficient documentation

## 2017-07-30 DIAGNOSIS — R2243 Localized swelling, mass and lump, lower limb, bilateral: Secondary | ICD-10-CM | POA: Insufficient documentation

## 2017-07-30 DIAGNOSIS — I10 Essential (primary) hypertension: Secondary | ICD-10-CM | POA: Insufficient documentation

## 2017-07-30 DIAGNOSIS — Z79899 Other long term (current) drug therapy: Secondary | ICD-10-CM | POA: Diagnosis not present

## 2017-07-30 LAB — BASIC METABOLIC PANEL
ANION GAP: 11 (ref 5–15)
BUN: 12 mg/dL (ref 6–20)
CALCIUM: 8.9 mg/dL (ref 8.9–10.3)
CO2: 22 mmol/L (ref 22–32)
Chloride: 107 mmol/L (ref 101–111)
Creatinine, Ser: 1.21 mg/dL (ref 0.61–1.24)
GFR calc non Af Amer: >60 mL/min (ref >60)
Glucose, Bld: 105 mg/dL — ABNORMAL HIGH (ref 65–99)
POTASSIUM: 3.5 mmol/L (ref 3.5–5.1)
Sodium: 140 mmol/L (ref 135–145)

## 2017-07-30 LAB — CBC
HEMATOCRIT: 39.9 % (ref 39.0–52.0)
HEMOGLOBIN: 13.1 g/dL (ref 13.0–17.0)
MCH: 29.8 pg (ref 26.0–34.0)
MCHC: 32.8 g/dL (ref 30.0–36.0)
MCV: 90.7 fL (ref 78.0–100.0)
Platelets: 199 10*3/uL (ref 150–400)
RBC: 4.4 MIL/uL (ref 4.22–5.81)
RDW: 14.9 % (ref 11.5–15.5)
WBC: 5.3 10*3/uL (ref 4.0–10.5)

## 2017-07-30 NOTE — ED Notes (Signed)
Called for Pt. No answer x4.

## 2017-07-30 NOTE — ED Triage Notes (Addendum)
Reports noticing swelling to BLE that started yesterday.  Also c/o back pain, headache.  Took ibuprofen this morning and bp meds. Also reports having a colonoscopy a week ago Thursday.  Unsure if symptoms are related.

## 2017-07-31 ENCOUNTER — Emergency Department (HOSPITAL_COMMUNITY)
Admission: EM | Admit: 2017-07-31 | Discharge: 2017-07-31 | Disposition: A | Discharge status: Home / Self Care | Payer: BC Managed Care – PPO | Attending: Emergency Medicine | Admitting: Emergency Medicine

## 2017-07-31 DIAGNOSIS — R609 Edema, unspecified: Secondary | ICD-10-CM

## 2017-07-31 MED ORDER — FUROSEMIDE 20 MG PO TABS: 20.0000 mg | ORAL_TABLET | Freq: Every day | ORAL | 0 refills | Status: DC

## 2017-07-31 NOTE — ED Provider Notes (Signed)
TIME SEEN: 4:06 AM  CHIEF COMPLAINT: Peripheral edema  HPI: Patient is a 62 year old male with history of hypertension, hyperlipidemia who presents to the emergency department with several days of bilateral lower extremity swelling.  Feels like his legs are "tight".  No injury to the legs.  No redness, warmth.  No numbness or tingling.  No chest pain or shortness of breath.  No history of CHF.  Is on lisinopril for blood pressure and reports compliance.  States he has been on his feet a lot recently as he works at [employer].  No history of PE or DVT.  ROS: See HPI Constitutional: no fever  Eyes: no drainage  ENT: no runny nose   Cardiovascular:  no chest pain  Resp: no SOB  GI: no vomiting GU: no dysuria Integumentary: no rash  Allergy: no hives  Musculoskeletal:  leg swelling  Neurological: no slurred speech ROS otherwise negative  PAST MEDICAL HISTORY/PAST SURGICAL HISTORY:  Past Medical History:  Diagnosis Date  . Abnormal EKG   . Allergy   . Arthritis    hands, knees  . Chest pain    a. normal echo stress test in 2010 with mild to moderate LVH.   Marland Kitchen Dyslipidemia   . GERD (gastroesophageal reflux disease)   . Hyperlipidemia   . Hypertension     MEDICATIONS:  Prior to Admission medications   Medication Sig Start Date End Date Taking? Authorizing Provider  aspirin EC 81 MG tablet Take 1 tablet (81 mg total) by mouth daily. 10/21/16   Tysinger, Camelia Eng, PA-C  ibuprofen (ADVIL,MOTRIN) 800 MG tablet Take 1 tablet (800 mg total) by mouth every 8 (eight) hours as needed. 10/13/16   Tysinger, Camelia Eng, PA-C  lisinopril-hydrochlorothiazide (PRINZIDE,ZESTORETIC) 20-25 MG tablet Take 1 tablet by mouth daily. 06/13/17   Tysinger, Camelia Eng, PA-C  pravastatin (PRAVACHOL) 20 MG tablet Take 1 tablet (20 mg total) by mouth every evening. 06/15/17 06/15/18  Tysinger, Camelia Eng, PA-C  sildenafil (REVATIO) 20 MG tablet 1-5 tablets (20 mg to 100 mg) prior to sexual activity daily prn 09/29/16    Tysinger, Camelia Eng, PA-C    ALLERGIES:  No Known Allergies  SOCIAL HISTORY:  Social History   Tobacco Use  . Smoking status: Never Smoker  . Smokeless tobacco: Never Used  Substance Use Topics  . Alcohol use: Yes    Alcohol/week: 1.8 oz    Types: 3 Cans of beer per week    FAMILY HISTORY: Family History  Problem Relation Age of Onset  . Diabetes Mother   . Stroke Mother   . Diabetes Sister   . Asthma Daughter   . Colon polyps Neg Hx   . Esophageal cancer Neg Hx   . Rectal cancer Neg Hx   . Stomach cancer Neg Hx   . Cancer Neg Hx   . Heart disease Neg Hx   . Colon cancer Neg Hx   . Liver cancer Neg Hx   . Pancreatic cancer Neg Hx     EXAM: BP (!) 154/92 (BP Location: Right Arm)   Pulse 76   Temp 98.3 F (36.8 C) (Oral)   Resp 18   Ht 5\' 10"  (1.778 m)   Wt 104.3 kg (230 lb)   SpO2 100%   BMI 33.00 kg/m  CONSTITUTIONAL: Alert and oriented and responds appropriately to questions. Well-appearing; well-nourished HEAD: Normocephalic EYES: Conjunctivae clear, pupils appear equal, EOMI ENT: normal nose; moist mucous membranes NECK: Supple, no meningismus, no nuchal rigidity, no  LAD  CARD: RRR; S1 and S2 appreciated; no murmurs, no clicks, no rubs, no gallops RESP: Normal chest excursion without splinting or tachypnea; breath sounds clear and equal bilaterally; no wheezes, no rhonchi, no rales, no hypoxia or respiratory distress, speaking full sentences ABD/GI: Normal bowel sounds; non-distended; soft, non-tender, no rebound, no guarding, no peritoneal signs, no hepatosplenomegaly BACK:  The back appears normal and is non-tender to palpation, there is no CVA tenderness EXT: Normal ROM in all joints; non-tender to palpation; nonpitting edema noted to bilateral feet, ankles and distal lower extremities; normal capillary refill; no cyanosis, no calf tenderness or swelling; compartments are soft, no redness or warmth noted to the legs, 2+ DP pulses bilaterally, normal  sensation throughout both legs SKIN: Normal color for age and race; warm; no rash NEURO: Moves all extremities equally PSYCH: The patient's mood and manner are appropriate. Grooming and personal hygiene are appropriate.  MEDICAL DECISION MAKING: Patient here with peripheral edema.  I suspect this could be from standing more recently.  No history of renal failure and has normal creatinine here.  Reports he is urinating normally.  No history of CHF.  No chest pain or shortness of breath.  Lungs are clear at this time.  No signs of cellulitis, compartment syndrome, gout.  Neurovascular intact distally.  Recommended rest, elevation and compression socks.  Will discharge with very short course of Lasix for symptomatic relief.  I do not feel he needs further emergent workup at this time.  Patient has a PCP for close follow-up.  At this time, I do not feel there is any life-threatening condition present. I have reviewed and discussed all results (EKG, imaging, lab, urine as appropriate) and exam findings with patient/family. I have reviewed nursing notes and appropriate previous records.  I feel the patient is safe to be discharged home without further emergent workup and can continue workup as an outpatient as needed. Discussed usual and customary return precautions. Patient/family verbalize understanding and are comfortable with this plan.  Outpatient follow-up has been provided if needed. All questions have been answered.      Dominiq Fontaine, Delice Bison, DO 07/31/17 [phone]

## 2017-07-31 NOTE — Discharge Instructions (Signed)
You may wear compression socks to help with swelling in your legs and keep your legs elevated above the level of your heart when at rest.  I recommend you watch the amount of sodium in your diet.

## 2017-08-18 ENCOUNTER — Ambulatory Visit: Payer: Self-pay | Admitting: Medical

## 2017-09-09 ENCOUNTER — Ambulatory Visit: Payer: Self-pay | Admitting: Medical

## 2017-10-25 ENCOUNTER — Telehealth (INDEPENDENT_AMBULATORY_CARE_PROVIDER_SITE_OTHER): Payer: Self-pay | Admitting: Physical Medicine and Rehabilitation

## 2017-10-25 NOTE — Telephone Encounter (Signed)
Ok but [place] f/up

## 2017-10-26 NOTE — Telephone Encounter (Signed)
Can you call patient to schedule a repeat hip injection?

## 2017-10-27 NOTE — Telephone Encounter (Signed)
Can you call this patient this morning? I don't have a phone in x-ray.

## 2017-10-27 NOTE — Telephone Encounter (Signed)
Pt is scheduled 6/4 for injection and 6/17 for a F/U with Dr. Ninfa Linden

## 2017-11-02 ENCOUNTER — Encounter: Payer: Self-pay | Admitting: Medical

## 2017-11-02 ENCOUNTER — Ambulatory Visit (INDEPENDENT_AMBULATORY_CARE_PROVIDER_SITE_OTHER): Payer: BC Managed Care – PPO | Admitting: Medical

## 2017-11-02 VITALS — BP 120/80 | HR 81 | Temp 98.3°F | Ht 70.0 in | Wt 240.2 lb

## 2017-11-02 DIAGNOSIS — D369 Benign neoplasm, unspecified site: Secondary | ICD-10-CM | POA: Diagnosis not present

## 2017-11-02 DIAGNOSIS — E785 Hyperlipidemia, unspecified: Secondary | ICD-10-CM | POA: Diagnosis not present

## 2017-11-02 DIAGNOSIS — E669 Obesity, unspecified: Secondary | ICD-10-CM

## 2017-11-02 DIAGNOSIS — M25551 Pain in right hip: Secondary | ICD-10-CM

## 2017-11-02 DIAGNOSIS — N529 Male erectile dysfunction, unspecified: Secondary | ICD-10-CM | POA: Diagnosis not present

## 2017-11-02 DIAGNOSIS — I10 Essential (primary) hypertension: Secondary | ICD-10-CM

## 2017-11-02 MED ORDER — PRAVASTATIN SODIUM 20 MG PO TABS: 20.0000 mg | ORAL_TABLET | Freq: Every evening | ORAL | 3 refills | Status: DC

## 2017-11-02 MED ORDER — ASPIRIN EC 81 MG PO TBEC: 81.0000 mg | DELAYED_RELEASE_TABLET | Freq: Every day | ORAL | 3 refills | Status: DC

## 2017-11-02 NOTE — Progress Notes (Signed)
Subjective: Chief Complaint  Patient presents with  . Follow-up    3 month follow up    Here for follow-up of medications.  I last saw him in December 2018 at which time we started Pravachol for cholesterol.  He took it for 3 months but did not follow back up so he has been out of the medicine for a couple months.  He has gained about 10 pounds since February.  Exercise currently is somewhat limited.  He has been having right hip pain plans to see orthopedic in June again for another hip injection.  He recently had his colonoscopy in February.  He has no other new complaints.  He does mention that sildenafil works fine at 1 or 2 tablets at a time.  Past Medical History:  Diagnosis Date  . Abnormal EKG   . Allergy   . Arthritis    hands, knees  . Chest pain    a. normal echo stress test in 2010 with mild to moderate LVH.   Marland Kitchen Dyslipidemia   . GERD (gastroesophageal reflux disease)   . History of exercise stress test 08/2016   normal  . Hyperlipidemia   . Hypertension    Current Outpatient Medications on File Prior to Visit  Medication Sig Dispense Refill  . ibuprofen (ADVIL,MOTRIN) 800 MG tablet Take 1 tablet (800 mg total) by mouth every 8 (eight) hours as needed. 30 tablet 0  . lisinopril-hydrochlorothiazide (PRINZIDE,ZESTORETIC) 20-25 MG tablet Take 1 tablet by mouth daily. 90 tablet 3  . sildenafil (REVATIO) 20 MG tablet 1-5 tablets (20 mg to 100 mg) prior to sexual activity daily prn 50 tablet 2  . furosemide (LASIX) 20 MG tablet Take 1 tablet (20 mg total) by mouth daily. (Patient not taking: Reported on 11/02/2017) 4 tablet 0   Current Facility-Administered Medications on File Prior to Visit  Medication Dose Route Frequency Provider Last Rate Last Dose  . 0.9 %  sodium chloride infusion  500 mL Intravenous Once Danis, Estill Cotta III, MD       ROS as in subjective   Objective: BP 120/80   Pulse 81   Temp 98.3 F (36.8 C) (Oral)   Ht 5\' 10"  (1.778 m)   Wt 240 lb 3.2 oz (109  kg)   SpO2 96%   BMI 34.47 kg/m    Wt Readings from Last 3 Encounters:  11/02/17 240 lb 3.2 oz (109 kg)  07/30/17 230 lb (104.3 kg)  07/21/17 236 lb (107 kg)    General appearance: alert, no distress, WD/WN,  Heart: RRR, normal S1, S2, no murmurs Lungs: CTA bilaterally, no wheezes, rhonchi, or rales Ext: no edema Pulses: 2+ symmetric, upper and lower extremities, normal cap refill    Assessment: Encounter Diagnoses  Name Primary?  . Essential hypertension Yes  . Erectile dysfunction, unspecified erectile dysfunction type   . Hyperlipidemia, unspecified hyperlipidemia type   . Obesity with serious comorbidity, unspecified classification, unspecified obesity type   . Right hip pain   . Tubular adenoma      Plan: He did not follow-up 3 months after starting the pravastatin so there is no point in doing the lab today.  I mainly counseled today on diet, exercise, need to lose weight, goals for therapy, reducing heart disease risk for his risk factors including male age over 32, hypertension, low HDL.  I reviewed his cardiac stress test from March 2018 which was fine.  Counseled on the DASH diet, really just encouraged him to get  active and eat healthy, lose weight.  Erectile dysfunction-doing fine on sildenafil 1 to 2 tablets at a time.  Last year he got his medication from the urology office directly.  He will check pricing at [facility].  I reviewed his colonoscopy report from February 2019 with Dr. Loletha Carrow.  He has tubular adenoma  I recommend he come in for nurse weight checks monthly to help hold him accountable  Follow-up in January for physical as scheduled  Kru was seen today for follow-up.  Diagnoses and all orders for this visit:  Essential hypertension  Erectile dysfunction, unspecified erectile dysfunction type  Hyperlipidemia, unspecified hyperlipidemia type  Obesity with serious comorbidity, unspecified classification, unspecified obesity  type  Right hip pain  Tubular adenoma  Other orders -     pravastatin (PRAVACHOL) 20 MG tablet; Take 1 tablet (20 mg total) by mouth every evening. -     aspirin EC 81 MG tablet; Take 1 tablet (81 mg total) by mouth daily.

## 2017-11-02 NOTE — Patient Instructions (Signed)
Recommendations:  Restart Pravachol/Pravastatin and stay on this long term at bedtime to lower heart disease risk  Likewise, stay on Aspirin 81mg  daily at bedtime for heart health  Continue blood pressure medication daily  Work on losing weight, getting 150 minutes or more exercise weekly  Work on diet changes as below  [place] stands for "Dietary Approaches to Stop Hypertension." The DASH eating plan is a healthy eating plan that has been shown to reduce high blood pressure (hypertension). Additional health benefits may include reducing the risk of type 2 diabetes mellitus, heart disease, and stroke. The DASH eating plan may also help with weight loss. WHAT DO I NEED TO KNOW ABOUT THE DASH EATING PLAN? For the DASH eating plan, you will follow these general guidelines:  Choose foods with a percent daily value for sodium of less than 5% (as listed on the food label).  Use salt-free seasonings or herbs instead of table salt or sea salt.  Check with your health care provider or pharmacist before using salt substitutes.  Eat lower-sodium products, often labeled as "lower sodium" or "no salt added."  Eat fresh foods.  Eat more vegetables, fruits, and low-fat dairy products.  Choose whole grains. Look for the word "whole" as the first word in the ingredient list.  Choose fish and skinless chicken or [place] more often than red meat. Limit fish, poultry, and meat to 6 oz (170 g) each day.  Limit sweets, desserts, sugars, and sugary drinks.  Choose heart-healthy fats.  Limit cheese to 1 oz (28 g) per day.  Eat more home-cooked food and less restaurant, buffet, and fast food.  Limit fried foods.  Cook foods using methods other than frying.  Limit canned vegetables. If you do use them, rinse them well to decrease the sodium.  When eating at a restaurant, ask that your food be prepared with less salt, or no salt if possible. WHAT FOODS CAN I EAT? Seek help from a  dietitian for individual calorie needs. Grains Whole grain or whole wheat bread. Brown rice. Whole grain or whole wheat pasta. Quinoa, bulgur, and whole grain cereals. Low-sodium cereals. Corn or whole wheat flour tortillas. Whole grain cornbread. Whole grain crackers. Low-sodium crackers. Vegetables Fresh or frozen vegetables (raw, steamed, roasted, or grilled). Low-sodium or reduced-sodium tomato and vegetable juices. Low-sodium or reduced-sodium tomato sauce and paste. Low-sodium or reduced-sodium canned vegetables.  Fruits All fresh, canned (in natural juice), or frozen fruits. Meat and Other Protein Products Ground beef (85% or leaner), grass-fed beef, or beef trimmed of fat. Skinless chicken or [place]. Ground chicken or [place]. Pork trimmed of fat. All fish and seafood. Eggs. Dried beans, peas, or lentils. Unsalted nuts and seeds. Unsalted canned beans. Dairy Low-fat dairy products, such as skim or 1% milk, 2% or reduced-fat cheeses, low-fat ricotta or cottage cheese, or plain low-fat yogurt. Low-sodium or reduced-sodium cheeses. Fats and Oils Tub margarines without trans fats. Light or reduced-fat mayonnaise and salad dressings (reduced sodium). Avocado. Safflower, olive, or canola oils. Natural peanut or almond butter. Other Unsalted popcorn and pretzels. The items listed above may not be a complete list of recommended foods or beverages. Contact your dietitian for more options. WHAT FOODS ARE NOT RECOMMENDED? Grains White bread. White pasta. White rice. Refined cornbread. Bagels and croissants. Crackers that contain trans fat. Vegetables Creamed or fried vegetables. Vegetables in a cheese sauce. Regular canned vegetables. Regular canned tomato sauce and paste. Regular tomato and vegetable juices. Fruits Dried fruits. Canned fruit  in light or heavy syrup. Fruit juice. Meat and Other Protein Products Fatty cuts of meat. Ribs, chicken wings, bacon, sausage, bologna, salami,  chitterlings, fatback, hot dogs, bratwurst, and packaged luncheon meats. Salted nuts and seeds. Canned beans with salt. Dairy Whole or 2% milk, cream, half-and-half, and cream cheese. Whole-fat or sweetened yogurt. Full-fat cheeses or blue cheese. Nondairy creamers and whipped toppings. Processed cheese, cheese spreads, or cheese curds. Condiments Onion and garlic salt, seasoned salt, table salt, and sea salt. Canned and packaged gravies. Worcestershire sauce. Tartar sauce. Barbecue sauce. Teriyaki sauce. Soy sauce, including reduced sodium. Steak sauce. Fish sauce. Oyster sauce. Cocktail sauce. Horseradish. Ketchup and mustard. Meat flavorings and tenderizers. Bouillon cubes. Hot sauce. Tabasco sauce. Marinades. Taco seasonings. Relishes. Fats and Oils Butter, stick margarine, lard, shortening, ghee, and bacon fat. Coconut, palm kernel, or palm oils. Regular salad dressings. Other Pickles and olives. Salted popcorn and pretzels. The items listed above may not be a complete list of foods and beverages to avoid. Contact your dietitian for more information. WHERE CAN I FIND MORE INFORMATION? National Heart, Lung, and Blood Institute: travelstabloid.com Document Released: 05/20/2011 Document Revised: 10/15/2013 Document Reviewed: 04/04/2013 [facility] Patient Information 2015 [place], [place]. This information is not intended to replace advice given to you by your health care provider. Make sure you discuss any questions you have with your health care provider.        Why follow it? Research shows. . Those who follow the Mediterranean diet have a reduced risk of heart disease  . The diet is associated with a reduced incidence of Parkinson's and Alzheimer's diseases . People following the diet may have longer life expectancies and lower rates of chronic diseases  . The Dietary Guidelines for Americans recommends the Mediterranean diet as an eating plan to promote  health and prevent disease  What Is the Mediterranean Diet?  . Healthy eating plan based on typical foods and recipes of Mediterranean-style cooking . The diet is primarily a plant based diet; these foods should make up a majority of meals   Starches - Plant based foods should make up a majority of meals - They are an important sources of vitamins, minerals, energy, antioxidants, and fiber - Choose whole grains, foods high in fiber and minimally processed items  - Typical grain sources include wheat, oats, barley, corn, brown rice, bulgar, farro, millet, polenta, couscous  - Various types of beans include chickpeas, lentils, fava beans, black beans, white beans   Fruits  Veggies - Large quantities of antioxidant rich fruits & veggies; 6 or more servings  - Vegetables can be eaten raw or lightly drizzled with oil and cooked  - Vegetables common to the traditional Mediterranean Diet include: artichokes, arugula, beets, broccoli, brussel sprouts, cabbage, carrots, celery, collard greens, cucumbers, eggplant, kale, leeks, lemons, lettuce, mushrooms, okra, onions, peas, peppers, potatoes, pumpkin, radishes, rutabaga, shallots, spinach, sweet potatoes, turnips, zucchini - Fruits common to the Mediterranean Diet include: apples, apricots, avocados, cherries, clementines, dates, figs, grapefruits, grapes, melons, nectarines, oranges, peaches, pears, pomegranates, strawberries, tangerines  Fats - Replace butter and margarine with healthy oils, such as olive oil, canola oil, and tahini  - Limit nuts to no more than a handful a day  - Nuts include walnuts, almonds, pecans, pistachios, pine nuts  - Limit or avoid candied, honey roasted or heavily salted nuts - Olives are central to the Mediterranean diet - can be eaten whole or used in a variety of dishes   Meats Protein - Limiting  red meat: no more than a few times a month - When eating red meat: choose lean cuts and keep the portion to the size of deck of  cards - Eggs: approx. 0 to 4 times a week  - Fish and lean poultry: at least 2 a week  - Healthy protein sources include, chicken, [place], lean beef, lamb - Increase intake of seafood such as tuna, salmon, trout, mackerel, shrimp, scallops - Avoid or limit high fat processed meats such as sausage and bacon  Dairy - Include moderate amounts of low fat dairy products  - Focus on healthy dairy such as fat free yogurt, skim milk, low or reduced fat cheese - Limit dairy products higher in fat such as whole or 2% milk, cheese, ice cream  Alcohol - Moderate amounts of red wine is ok  - No more than 5 oz daily for women (all ages) and men older than age 59  - No more than 10 oz of wine daily for men younger than 66  Other - Limit sweets and other desserts  - Use herbs and spices instead of salt to flavor foods  - Herbs and spices common to the traditional Mediterranean Diet include: basil, bay leaves, chives, cloves, cumin, fennel, garlic, lavender, marjoram, mint, oregano, parsley, pepper, rosemary, sage, savory, sumac, tarragon, thyme   It's not just a diet, it's a lifestyle:  . The Mediterranean diet includes lifestyle factors typical of those in the region  . Foods, drinks and meals are best eaten with others and savored . Daily physical activity is important for overall good health . This could be strenuous exercise like running and aerobics . This could also be more leisurely activities such as walking, housework, yard-work, or taking the stairs . Moderation is the key; a balanced and healthy diet accommodates most foods and drinks . Consider portion sizes and frequency of consumption of certain foods   Meal Ideas & Options:  . Breakfast:  o Whole wheat toast or whole wheat English muffins with peanut butter & hard boiled egg o Steel cut oats topped with apples & cinnamon and skim milk  o Fresh fruit: banana, strawberries, melon, berries, peaches  o Smoothies: strawberries, bananas, greek  yogurt, peanut butter o Low fat greek yogurt with blueberries and granola  o Egg white omelet with spinach and mushrooms o Breakfast couscous: whole wheat couscous, apricots, skim milk, cranberries  . Sandwiches:  o Hummus and grilled vegetables (peppers, zucchini, squash) on whole wheat bread   o Grilled chicken on whole wheat pita with lettuce, tomatoes, cucumbers or tzatziki  o Tuna salad on whole wheat bread: tuna salad made with greek yogurt, olives, red peppers, capers, green onions o Garlic rosemary lamb pita: lamb sauted with garlic, rosemary, salt & pepper; add lettuce, cucumber, greek yogurt to pita - flavor with lemon juice and black pepper  . Seafood:  o Mediterranean grilled salmon, seasoned with garlic, basil, parsley, lemon juice and black pepper o Shrimp, lemon, and spinach whole-grain pasta salad made with low fat greek yogurt  o Seared scallops with lemon orzo  o Seared tuna steaks seasoned salt, pepper, coriander topped with tomato mixture of olives, tomatoes, olive oil, minced garlic, parsley, green onions and cappers  . Meats:  o Herbed greek chicken salad with kalamata olives, cucumber, feta  o Red bell peppers stuffed with spinach, bulgur, lean ground beef (or lentils) & topped with feta   o Kebabs: skewers of chicken, tomatoes, onions, zucchini, squash  o  [place] burgers: made with red onions, mint, dill, lemon juice, feta cheese topped with roasted red peppers . Vegetarian o Cucumber salad: cucumbers, artichoke hearts, celery, red onion, feta cheese, tossed in olive oil & lemon juice  o Hummus and whole grain pita points with a greek salad (lettuce, tomato, feta, olives, cucumbers, red onion) o Lentil soup with celery, carrots made with vegetable broth, garlic, salt and pepper  o Tabouli salad: parsley, bulgur, mint, scallions, cucumbers, tomato, radishes, lemon juice, olive oil, salt and pepper.

## 2017-11-15 ENCOUNTER — Ambulatory Visit (INDEPENDENT_AMBULATORY_CARE_PROVIDER_SITE_OTHER): Payer: BC Managed Care – PPO | Admitting: Physical Medicine and Rehabilitation

## 2017-11-15 ENCOUNTER — Encounter (INDEPENDENT_AMBULATORY_CARE_PROVIDER_SITE_OTHER): Payer: Self-pay | Admitting: Physical Medicine and Rehabilitation

## 2017-11-15 ENCOUNTER — Ambulatory Visit (INDEPENDENT_AMBULATORY_CARE_PROVIDER_SITE_OTHER): Payer: BC Managed Care – PPO

## 2017-11-15 DIAGNOSIS — M25551 Pain in right hip: Secondary | ICD-10-CM | POA: Diagnosis not present

## 2017-11-15 NOTE — Patient Instructions (Signed)

## 2017-11-15 NOTE — Progress Notes (Signed)
.  Numeric Pain Rating Scale and Functional Assessment Average Pain 8   In the last MONTH (on 0-10 scale) has pain interfered with the following?  1. General activity like being  able to carry out your everyday physical activities such as walking, climbing stairs, carrying groceries, or moving a chair?  Rating(3)   -Dye Allergies.  

## 2017-11-15 NOTE — Progress Notes (Signed)
Wesley Lawson - 62 y.o. male MRN [id number]  Date of birth: 08/14/1955  Office Visit Note: Visit Date: 11/15/2017 PCP: Carlena Hurl, PA-C Referred by: Carlena Hurl, PA-C  Subjective: Chief Complaint  Patient presents with  . Right Hip - Pain   HPI: Wesley Lawson is a 62 year old gentleman that I last saw in January and completed left intra-articular hip injection with good relief of symptoms.  He comes in today with worsening right hip and groin pain.  He has had a history of bilateral hip pain and osteoarthritis.  The last injection on the right was completed in August of last year.  He has been doing well up until just recently.  He reports any heavy lifting and movement can make it worse.  He does use ibuprofen with some relief.  He considers his pain an 8 out of 10 at this point.  He has not seen Dr. Ninfa Linden since 2017 but has done well with intermittent injection of the hip.  He reports 95% relief.  He has had no new trauma.  On exam he does have pain with rotation of the hip but no other red flag complaints.  We will go ahead and repeat the injection on the right hip today.  Depending on relief we will follow-up with Dr. Ninfa Linden.   ROS Otherwise per HPI.  Assessment & Plan: Visit Diagnoses:  1. Right hip pain     Plan: No additional findings.   Meds & Orders: No orders of the defined types were placed in this encounter.   Orders Placed This Encounter  Procedures  . Large Joint Inj: R hip joint  . XR C-ARM NO REPORT    Follow-up: Return if symptoms worsen or fail to improve.   Procedures: Large Joint Inj: R hip joint on 11/15/2017 3:47 PM Indications: diagnostic evaluation and pain Details: 22 G 3.5 in needle, fluoroscopy-guided anterior approach  Arthrogram: No  Medications: 80 mg triamcinolone acetonide 40 MG/ML; 3 mL bupivacaine 0.5 % Outcome: tolerated well, no immediate complications  There was excellent flow of contrast producing a partial arthrogram of the  hip. The patient did have relief of symptoms during the anesthetic phase of the injection. Procedure, treatment alternatives, risks and benefits explained, specific risks discussed. Consent was given by the patient. Immediately prior to procedure a time out was called to verify the correct patient, procedure, equipment, support staff and site/side marked as required. Patient was prepped and draped in the usual sterile fashion.      No notes on file   Clinical History: No specialty comments available.   He reports that he has never smoked. He has never used smokeless tobacco.  Recent Labs    06/13/17 0932  HGBA1C 5.6    Objective:  VS:  HT:    WT:   BMI:     BP:   HR: bpm  TEMP: ( )  RESP:  Physical Exam  Ortho Exam Imaging: No results found.  Past Medical/Family/Surgical/Social History: Medications & Allergies reviewed per EMR, new medications updated. Patient Active Problem List   Diagnosis Date Noted  . Tubular adenoma 11/02/2017  . Screen for colon cancer 06/13/2017  . Screening for prostate cancer 06/13/2017  . Encounter for health maintenance examination in adult 06/13/2017  . Muscle tightness 06/13/2017  . Rash 10/21/2016  . Herpes zoster without complication 06/16/7251  . Cold sore 10/21/2016  . Hyperlipidemia 07/30/2016  . Erectile dysfunction 07/30/2016  . Leg pain, anterior, unspecified laterality  07/30/2016  . 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 Osteoarthritis of both hips 07/30/2016  . Abnormal EKG 07/30/2016  . Right-sided low back pain without sciatica 10/13/2015  . Obesity 10/13/2015  . Essential hypertension 10/13/2015  . Decreased range of hip movement 10/13/2015  . Right hip pain 02/21/2014  . Numbness of left hand 02/07/2014  . Pain of right thumb 02/07/2014   Past Medical History:  Diagnosis Date  . Abnormal EKG   . Allergy   . Arthritis    hands, knees  . Chest pain    a. normal echo stress test in 2010 with mild to moderate  LVH.   Marland Kitchen Dyslipidemia   . GERD (gastroesophageal reflux disease)   . History of exercise stress test 08/2016   normal  . Hyperlipidemia   . Hypertension    Family History  Problem Relation Age of Onset  . Diabetes Mother   . Stroke Mother   . Diabetes Sister   . Asthma Daughter   . Colon polyps Neg Hx   . Esophageal cancer Neg Hx   . Rectal cancer Neg Hx   . Stomach cancer Neg Hx   . Cancer Neg Hx   . Heart disease Neg Hx   . Colon cancer Neg Hx   . Liver cancer Neg Hx   . Pancreatic cancer Neg Hx    Past Surgical History:  Procedure Laterality Date  . COLONOSCOPY  07/21/2017   tubular adenoma, Dr. Wilfrid Lund  . WISDOM TOOTH EXTRACTION     Social History   Occupational History  . Not on file  Tobacco Use  . Smoking status: Never Smoker  . Smokeless tobacco: Never Used  Substance and Sexual Activity  . Alcohol use: Yes    Alcohol/week: 1.8 oz    Types: 3 Cans of beer per week  . Drug use: No  . Sexual activity: Yes

## 2017-11-23 ENCOUNTER — Encounter: Payer: Self-pay | Admitting: Medical

## 2017-11-23 ENCOUNTER — Ambulatory Visit (INDEPENDENT_AMBULATORY_CARE_PROVIDER_SITE_OTHER): Payer: BC Managed Care – PPO | Admitting: Medical

## 2017-11-23 VITALS — BP 124/72 | HR 102 | Temp 98.3°F | Ht 70.0 in | Wt 235.3 lb

## 2017-11-23 DIAGNOSIS — J988 Other specified respiratory disorders: Secondary | ICD-10-CM

## 2017-11-23 NOTE — Progress Notes (Signed)
Subjective:  Wesley Lawson is a 62 y.o. male who presents for  Chief Complaint  Patient presents with  . URI    runny nose,cough,chills x2 days   Symptoms include 3 day hx/o cough, runny nose, missed work 2 days, has runny nose, chest congestion.  Some chills.   No NVD, no wheezing,  No rash, no fever.  Using Mucinex DM for symptoms.   denies sick contacts.  No other aggravating or relieving factors.  No other complaint.    Past Medical History:  Diagnosis Date  . Abnormal EKG   . Allergy   . Arthritis    hands, knees  . Chest pain    a. normal echo stress test in 2010 with mild to moderate LVH.   Marland Kitchen Dyslipidemia   . GERD (gastroesophageal reflux disease)   . History of exercise stress test 08/2016   normal  . Hyperlipidemia   . Hypertension     Current Outpatient Medications on File Prior to Visit  Medication Sig Dispense Refill  . aspirin EC 81 MG tablet Take 1 tablet (81 mg total) by mouth daily. 90 tablet 3  . furosemide (LASIX) 20 MG tablet Take 1 tablet (20 mg total) by mouth daily. 4 tablet 0  . ibuprofen (ADVIL,MOTRIN) 800 MG tablet Take 1 tablet (800 mg total) by mouth every 8 (eight) hours as needed. 30 tablet 0  . lisinopril-hydrochlorothiazide (PRINZIDE,ZESTORETIC) 20-25 MG tablet Take 1 tablet by mouth daily. 90 tablet 3  . pravastatin (PRAVACHOL) 20 MG tablet Take 1 tablet (20 mg total) by mouth every evening. 90 tablet 3  . sildenafil (REVATIO) 20 MG tablet 1-5 tablets (20 mg to 100 mg) prior to sexual activity daily prn 50 tablet 2   Current Facility-Administered Medications on File Prior to Visit  Medication Dose Route Frequency Provider Last Rate Last Dose  . 0.9 %  sodium chloride infusion  500 mL Intravenous Once Danis, Estill Cotta III, MD        ROS as in subjective   Objective: BP 124/72   Pulse (!) 102   Temp 98.3 F (36.8 C) (Oral)   Ht 5\' 10"  (1.778 m)   Wt 235 lb 4.8 oz (106.7 kg)   SpO2 97%   BMI 33.76 kg/m   General appearance: Alert, well  developed, well nourished, no distress                             Skin: warm, no rash                           Head: no sinus tenderness,                            Eyes: conjunctiva WNL, corneas clear                            Ears: unable to see left tympanic membrane due to impacted cerumen, mild serous fluid behind right tympanic membrane, external ear canals normal                          Nose: septum midline, turbinates swollen, with erythema and clear discharge             Mouth/throat: MMM, tongue normal,mild pharyngeal erythema  Neck: supple, no adenopathy, no thyromegaly, non tender                         Lungs: clear, no wheezes, no rales, no rhonchi        Assessment  Encounter Diagnosis  Name Primary?  Marland Kitchen Respiratory tract infection Yes      Plan: Discussed diagnosis of viral URI.   Discussed usual time frame to see improvement. Discussed possible complications or symptoms that would prompt call back or recheck within the next few days.     Specific home care recommendations discussed:  Decongestant: You may use OTC Guaifenesin (Mucinex plain) for congestion.    Cough suppression: If you have cough, you may use over-the-counter Dextromethorphan (Delsym) as directed on the label  Pain/fever relief: You may use over-the-counter tylenol for pain or fever  Drink extra fluids. Fluids help thin the mucus so your sinuses can drain more easily.   Use saline nasal sprays to help moisten your sinuses. The sprays can be found at your local drugstore.   Patient was advised to call or return if worse or not improving in the next few days.    Patient voiced understanding of diagnosis, recommendations, and treatment plan.

## 2017-11-28 ENCOUNTER — Encounter (INDEPENDENT_AMBULATORY_CARE_PROVIDER_SITE_OTHER): Payer: Self-pay | Admitting: Orthopaedic Surgery

## 2017-11-28 ENCOUNTER — Ambulatory Visit (INDEPENDENT_AMBULATORY_CARE_PROVIDER_SITE_OTHER): Payer: BC Managed Care – PPO | Admitting: Orthopaedic Surgery

## 2017-11-28 DIAGNOSIS — M1611 Unilateral primary osteoarthritis, right hip: Secondary | ICD-10-CM

## 2017-11-28 DIAGNOSIS — M1612 Unilateral primary osteoarthritis, left hip: Secondary | ICD-10-CM | POA: Diagnosis not present

## 2017-11-28 MED ORDER — TRIAMCINOLONE ACETONIDE 40 MG/ML IJ SUSP
80.0000 mg | INTRAMUSCULAR | Status: AC | PRN
  Administered 2017-11-15: 80 mg via INTRA_ARTICULAR

## 2017-11-28 MED ORDER — BUPIVACAINE HCL 0.5 % IJ SOLN
3.0000 mL | INTRAMUSCULAR | Status: AC | PRN
  Administered 2017-11-15: 3 mL via INTRA_ARTICULAR

## 2017-11-28 NOTE — Progress Notes (Signed)
The patient is someone that I have not seen since 2017 however he has known and well-documented arthritis in both his hips.  In January of this year he underwent a left hip intra-articular steroid injection under fluoroscopy by Dr. Ernestina Patches.  He just had an injection in his right hip 2 weeks ago with a steroid under direct fluoroscopy as well by Dr. Ernestina Patches.  He says as of now he is doing well.  He is still not interested in any hip replacement surgery.  On exam he does walk with a normal-appearing gait.  Both hips are stiff with pain in the groin on internal and external rotation.  He says the pain is just mild.  From my standpoint he knows to wait at least 5 to 6 months between steroid injections in a single hip and he understands that this will eventually worsen the cartilage in the hip.  At some point he may end up deciding on hip replacement surgery but since he is doing well with his current regimen he is not in surgery.  At some point we do see him back I would like a low AP pelvis and lateral both his hips.  All question concerns were answered and addressed.  Follow-up is otherwise as needed.

## 2017-12-16 ENCOUNTER — Other Ambulatory Visit: Payer: Self-pay

## 2017-12-16 ENCOUNTER — Ambulatory Visit (HOSPITAL_COMMUNITY)
Admission: EM | Admit: 2017-12-16 | Discharge: 2017-12-16 | Disposition: A | Discharge status: Home / Self Care | Payer: BC Managed Care – PPO | Attending: Family Medicine | Admitting: Family Medicine

## 2017-12-16 ENCOUNTER — Encounter (HOSPITAL_COMMUNITY): Payer: Self-pay | Admitting: Emergency Medicine

## 2017-12-16 DIAGNOSIS — H00015 Hordeolum externum left lower eyelid: Secondary | ICD-10-CM | POA: Diagnosis not present

## 2017-12-16 MED ORDER — ERYTHROMYCIN 5 MG/GM OP OINT: TOPICAL_OINTMENT | OPHTHALMIC | 0 refills | Status: DC

## 2017-12-16 NOTE — Discharge Instructions (Signed)
Use erythromycin ointment on swelling of the left lower eyelid as directed. Lid scrubs and warm compresses as directed. Monitor for any worsening of symptoms, changes in vision, sensitivity to light, eye swelling, painful eye movement, follow up with ophthalmology for further evaluation.

## 2017-12-16 NOTE — ED Triage Notes (Signed)
The patient presented to the [facility] with a complaint of left eye pain and swelling x 1 week that he believed to be a stye.

## 2017-12-16 NOTE — ED Provider Notes (Signed)
[place]    CSN: [id number] Arrival date & time: 12/16/17  1944     History   Chief Complaint Chief Complaint  Patient presents with  . Eye Pain    HPI Wesley Lawson is a 62 y.o. male.   63 year old male comes in for 1 week history of left eyelid swelling.  States it is painful to palpation, and wonders if it is a stye.  He denies eye redness, changes in vision, photophobia, eye drainage.  Does state that  left eye feels irritated.  He denies URI symptoms such as cough, congestion, sore throat.  Denies fever, chills, night sweats.  Has been using over-the-counter eyedrops without relief.  He wears glasses, no contact lens use.     Past Medical History:  Diagnosis Date  . Abnormal EKG   . Allergy   . Arthritis    hands, knees  . Chest pain    a. normal echo stress test in 2010 with mild to moderate LVH.   Marland Kitchen Dyslipidemia   . GERD (gastroesophageal reflux disease)   . History of exercise stress test 08/2016   normal  . Hyperlipidemia   . Hypertension     Patient Active Problem List   Diagnosis Date Noted  . Tubular adenoma 11/02/2017  . Screen for colon cancer 06/13/2017  . Screening for prostate cancer 06/13/2017  . Encounter for health maintenance examination in adult 06/13/2017  . Muscle tightness 06/13/2017  . Rash 10/21/2016  . Herpes zoster without complication 79/89/2119  . Cold sore 10/21/2016  . Hyperlipidemia 07/30/2016  . Erectile dysfunction 07/30/2016  . Leg pain, anterior, unspecified laterality 07/30/2016  . 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 Osteoarthritis of both hips 07/30/2016  . Abnormal EKG 07/30/2016  . Right-sided low back pain without sciatica 10/13/2015  . Obesity 10/13/2015  . Essential hypertension 10/13/2015  . Decreased range of hip movement 10/13/2015  . Right hip pain 02/21/2014  . Numbness of left hand 02/07/2014  . Pain of right thumb 02/07/2014    Past Surgical History:  Procedure Laterality Date  .  COLONOSCOPY  07/21/2017   tubular adenoma, Dr. Wilfrid Lund  . WISDOM TOOTH EXTRACTION         Home Medications    Prior to Admission medications   Medication Sig Start Date End Date Taking? Authorizing Provider  aspirin EC 81 MG tablet Take 1 tablet (81 mg total) by mouth daily. 11/02/17   Tysinger, Camelia Eng, PA-C  erythromycin ophthalmic ointment Place a 1/2 inch ribbon of ointment 4 times a day into the lower eyelid. 12/16/17   Tasia Catchings, Mialani Reicks V, PA-C  furosemide (LASIX) 20 MG tablet Take 1 tablet (20 mg total) by mouth daily. 07/31/17   Ward, Delice Bison, DO  ibuprofen (ADVIL,MOTRIN) 800 MG tablet Take 1 tablet (800 mg total) by mouth every 8 (eight) hours as needed. 10/13/16   Tysinger, Camelia Eng, PA-C  lisinopril-hydrochlorothiazide (PRINZIDE,ZESTORETIC) 20-25 MG tablet Take 1 tablet by mouth daily. 06/13/17   Tysinger, Camelia Eng, PA-C  pravastatin (PRAVACHOL) 20 MG tablet Take 1 tablet (20 mg total) by mouth every evening. 11/02/17 11/02/18  Tysinger, Camelia Eng, PA-C  sildenafil (REVATIO) 20 MG tablet 1-5 tablets (20 mg to 100 mg) prior to sexual activity daily prn 09/29/16   Tysinger, Camelia Eng, PA-C    Family History Family History  Problem Relation Age of Onset  . Diabetes Mother   . Stroke Mother   . Diabetes Sister   . Asthma  Daughter   . Colon polyps Neg Hx   . Esophageal cancer Neg Hx   . Rectal cancer Neg Hx   . Stomach cancer Neg Hx   . Cancer Neg Hx   . Heart disease Neg Hx   . Colon cancer Neg Hx   . Liver cancer Neg Hx   . Pancreatic cancer Neg Hx     Social History Social History   Tobacco Use  . Smoking status: Never Smoker  . Smokeless tobacco: Never Used  Substance Use Topics  . Alcohol use: Yes    Alcohol/week: 1.8 oz    Types: 3 Cans of beer per week  . Drug use: No     Allergies   Patient has no known allergies.   Review of Systems Review of Systems  Reason unable to perform ROS: See HPI as above.     Physical Exam Triage Vital Signs ED Triage Vitals    Enc Vitals Group     BP 12/16/17 1952 132/76     Pulse Rate 12/16/17 1952 71     Resp 12/16/17 1952 18     Temp 12/16/17 1952 98.9 F (37.2 C)     Temp Source 12/16/17 1952 Oral     SpO2 12/16/17 1952 100 %     Weight --      Height --      Head Circumference --      Peak Flow --      Pain Score 12/16/17 1951 9     Pain Loc --      Pain Edu? --      Excl. in [place]? --    No data found.  Updated Vital Signs BP 132/76 (BP Location: Left Arm)   Pulse 71   Temp 98.9 F (37.2 C) (Oral)   Resp 18   SpO2 100%   Visual Acuity Right Eye Distance: 20/40(with corrective lenses) Left Eye Distance: 20/25(with corrective lenses) Bilateral Distance: 20/20(with corrective lenses)  Right Eye Near:   Left Eye Near:    Bilateral Near:     Physical Exam  Constitutional: He is oriented to person, place, and time. He appears well-developed and well-nourished. No distress.  HENT:  Head: Normocephalic and atraumatic.  Eyes: Pupils are equal, round, and reactive to light. Conjunctivae and EOM are normal. No foreign body present in the left eye.    Neurological: He is alert and oriented to person, place, and time.  Skin: He is not diaphoretic.     UC Treatments / Results  Labs (all labs ordered are listed, but only abnormal results are displayed) Labs Reviewed - No data to display  EKG None  Radiology No results found.  Procedures Procedures (including critical care time)  Medications Ordered in UC Medications - No data to display  Initial Impression / Assessment and Plan / UC Course  I have reviewed the triage vital signs and the nursing notes.  Pertinent labs & imaging results that were available during my care of the patient were reviewed by me and considered in my medical decision making (see chart for details).    Erythromycin ointment as discussed.  Lid scrubs, warm compress.  Return precautions given.  Patient expresses understanding and agrees to plan.  Final  Clinical Impressions(s) / UC Diagnoses   Final diagnoses:  Hordeolum externum of left lower eyelid    ED Prescriptions    Medication Sig Dispense Auth. Provider   erythromycin ophthalmic ointment Place a 1/2 inch ribbon of ointment  4 times a day into the lower eyelid. 1 g Tobin Chad, [place] 12/16/17 2102

## 2017-12-28 ENCOUNTER — Other Ambulatory Visit: Payer: Self-pay | Admitting: Medical

## 2017-12-29 NOTE — Telephone Encounter (Signed)
Is this ok to refill?  

## 2018-03-10 ENCOUNTER — Telehealth (INDEPENDENT_AMBULATORY_CARE_PROVIDER_SITE_OTHER): Payer: Self-pay | Admitting: Radiology

## 2018-03-10 NOTE — Telephone Encounter (Signed)
Patient called requesting left hip injection by Dr. Ernestina Patches.  Previous injections.... 11/15/17 right hip injection 07/01/17 left hip injection  Ok to repeat?

## 2018-03-10 NOTE — Telephone Encounter (Signed)
ok 

## 2018-03-10 NOTE — Telephone Encounter (Signed)
Pt scheduled for 03/16/18

## 2018-03-16 ENCOUNTER — Ambulatory Visit (INDEPENDENT_AMBULATORY_CARE_PROVIDER_SITE_OTHER): Payer: Self-pay

## 2018-03-16 ENCOUNTER — Ambulatory Visit (INDEPENDENT_AMBULATORY_CARE_PROVIDER_SITE_OTHER): Payer: BC Managed Care – PPO | Admitting: Physical Medicine and Rehabilitation

## 2018-03-16 ENCOUNTER — Encounter (INDEPENDENT_AMBULATORY_CARE_PROVIDER_SITE_OTHER): Payer: Self-pay | Admitting: Physical Medicine and Rehabilitation

## 2018-03-16 DIAGNOSIS — M25551 Pain in right hip: Secondary | ICD-10-CM

## 2018-03-16 DIAGNOSIS — M25552 Pain in left hip: Secondary | ICD-10-CM | POA: Diagnosis not present

## 2018-03-16 DIAGNOSIS — M79644 Pain in right finger(s): Secondary | ICD-10-CM

## 2018-03-16 NOTE — Progress Notes (Signed)
   Wesley Lawson - 62 y.o. male MRN [id number]  Date of birth: Oct 25, 1955  Office Visit Note: Visit Date: 03/16/2018 PCP: Carlena Hurl, PA-C Referred by: Carlena Hurl, PA-C  Subjective: Chief Complaint  Patient presents with  . Left Hip - Pain   HPI:  Wesley Lawson is a 62 y.o. male who comes in today left intra-articular hip injection with fluoroscopic guidance.  The patient has had prior injections in the past with good relief.  Last injection performed was on the right side several months ago and is been doing quite well with that.  Last injection on the left hip was in January.  He will continue to follow with Dr. Ninfa Linden as needed.  He is really not seeing Dr. Ninfa Linden in some time but has been doing well and having very infrequent injections of the hip.  Depending on relief we will get him back into see Dr. Ninfa Linden soon.  ROS Otherwise per HPI.  Assessment & Plan: Visit Diagnoses:  1. Pain in left hip   2. Pain in right hip     Plan: No additional findings.   Meds & Orders: No orders of the defined types were placed in this encounter.   Orders Placed This Encounter  Procedures  . Large Joint Inj: R hip joint  . XR C-ARM NO REPORT    Follow-up: Return if symptoms worsen or fail to improve.   Procedures: Large Joint Inj: L hip joint on 03/16/2018 3:40 PM Indications: diagnostic evaluation and pain Details: 22 G 3.5 in needle, fluoroscopy-guided anterior approach  Arthrogram: No  Medications: 3 mL bupivacaine 0.5 %; 80 mg triamcinolone acetonide 40 MG/ML Outcome: tolerated well, no immediate complications  There was excellent flow of contrast producing a partial arthrogram of the hip. The patient did have relief of symptoms during the anesthetic phase of the injection. Procedure, treatment alternatives, risks and benefits explained, specific risks discussed. Consent was given by the patient. Immediately prior to procedure a time out was called to verify the  correct patient, procedure, equipment, support staff and site/side marked as required. Patient was prepped and draped in the usual sterile fashion.      No notes on file   Clinical History: No specialty comments available.     Objective:  VS:  HT:    WT:   BMI:     BP:   HR: bpm  TEMP: ( )  RESP:  Physical Exam  Ortho Exam Imaging: No results found.

## 2018-03-16 NOTE — Progress Notes (Signed)
 .  Numeric Pain Rating Scale and Functional Assessment Average Pain 6   In the last MONTH (on 0-10 scale) has pain interfered with the following?  1. General activity like being  able to carry out your everyday physical activities such as walking, climbing stairs, carrying groceries, or moving a chair?  Rating(4)    -Dye Allergies.  

## 2018-03-16 NOTE — Patient Instructions (Signed)

## 2018-03-23 MED ORDER — TRIAMCINOLONE ACETONIDE 40 MG/ML IJ SUSP
80.0000 mg | INTRAMUSCULAR | Status: AC | PRN
  Administered 2018-03-16: 80 mg via INTRA_ARTICULAR

## 2018-03-23 MED ORDER — BUPIVACAINE HCL 0.5 % IJ SOLN
3.0000 mL | INTRAMUSCULAR | Status: AC | PRN
  Administered 2018-03-16: 3 mL via INTRA_ARTICULAR

## 2018-04-01 ENCOUNTER — Ambulatory Visit (HOSPITAL_COMMUNITY)
Admission: EM | Admit: 2018-04-01 | Discharge: 2018-04-01 | Disposition: A | Discharge status: Home / Self Care | Payer: BC Managed Care – PPO | Attending: Family Medicine | Admitting: Family Medicine

## 2018-04-01 ENCOUNTER — Encounter (HOSPITAL_COMMUNITY): Payer: Self-pay

## 2018-04-01 DIAGNOSIS — Z7982 Long term (current) use of aspirin: Secondary | ICD-10-CM | POA: Insufficient documentation

## 2018-04-01 DIAGNOSIS — I10 Essential (primary) hypertension: Secondary | ICD-10-CM | POA: Insufficient documentation

## 2018-04-01 DIAGNOSIS — M160 Bilateral primary osteoarthritis of hip: Secondary | ICD-10-CM | POA: Insufficient documentation

## 2018-04-01 DIAGNOSIS — Z9889 Other specified postprocedural states: Secondary | ICD-10-CM | POA: Insufficient documentation

## 2018-04-01 DIAGNOSIS — E785 Hyperlipidemia, unspecified: Secondary | ICD-10-CM | POA: Diagnosis not present

## 2018-04-01 DIAGNOSIS — Z833 Family history of diabetes mellitus: Secondary | ICD-10-CM | POA: Diagnosis not present

## 2018-04-01 DIAGNOSIS — Z823 Family history of stroke: Secondary | ICD-10-CM | POA: Insufficient documentation

## 2018-04-01 DIAGNOSIS — Z79899 Other long term (current) drug therapy: Secondary | ICD-10-CM | POA: Insufficient documentation

## 2018-04-01 DIAGNOSIS — J029 Acute pharyngitis, unspecified: Secondary | ICD-10-CM | POA: Insufficient documentation

## 2018-04-01 DIAGNOSIS — N529 Male erectile dysfunction, unspecified: Secondary | ICD-10-CM | POA: Diagnosis not present

## 2018-04-01 DIAGNOSIS — B029 Zoster without complications: Secondary | ICD-10-CM | POA: Insufficient documentation

## 2018-04-01 LAB — POCT RAPID STREP A: Streptococcus, Group A Screen (Direct): NEGATIVE

## 2018-04-01 MED ORDER — AMOXICILLIN-POT CLAVULANATE 875-125 MG PO TABS: 1.0000 | ORAL_TABLET | Freq: Two times a day (BID) | ORAL | 0 refills | Status: DC

## 2018-04-01 MED ORDER — FLUTICASONE PROPIONATE 50 MCG/ACT NA SUSP: 1.0000 | Freq: Every day | NASAL | 2 refills | Status: DC

## 2018-04-01 MED ORDER — DEXAMETHASONE SODIUM PHOSPHATE 10 MG/ML IJ SOLN
10.0000 mg | Freq: Once | INTRAMUSCULAR | Status: AC
  Administered 2018-04-01: 10 mg via INTRAMUSCULAR

## 2018-04-01 MED ORDER — DEXAMETHASONE SODIUM PHOSPHATE 10 MG/ML IJ SOLN
INTRAMUSCULAR | Status: AC
  Filled 2018-04-01: qty 1

## 2018-04-01 NOTE — ED Triage Notes (Signed)
Pt presents with sore throat x 3 days. Progressively worse.

## 2018-04-01 NOTE — Discharge Instructions (Addendum)
Continue to push fluids and take over the counter medications as directed on the back of the box for symptomatic relief.  Start antibiotics if symptoms have not improved in the next several days.

## 2018-04-01 NOTE — ED Provider Notes (Addendum)
[facility]    CSN: [id number] Arrival date & time: 04/01/18  1013     History   Chief Complaint Chief Complaint  Patient presents with  . Sore Throat    HPI Wesley Lawson is a 62 y.o. male.     62 y.o. male presents with sore throat and left ear pressureX 3 days. Condition is acute and worsening in nature. Condition is made better by nothing. Condition is made worse by nothing. Patient denies any relief from OTC cold medication taken  prior to there arrival at this facility. Patient denies any fevers, cough, SHOB, N/V/D.      Past Medical History:  Diagnosis Date  . Abnormal EKG   . Allergy   . Arthritis    hands, knees  . Chest pain    a. normal echo stress test in 2010 with mild to moderate LVH.   Marland Kitchen Dyslipidemia   . GERD (gastroesophageal reflux disease)   . History of exercise stress test 08/2016   normal  . Hyperlipidemia   . Hypertension     Patient Active Problem List   Diagnosis Date Noted  . Tubular adenoma 11/02/2017  . Screen for colon cancer 06/13/2017  . Screening for prostate cancer 06/13/2017  . Encounter for health maintenance examination in adult 06/13/2017  . Muscle tightness 06/13/2017  . Rash 10/21/2016  . Herpes zoster without complication 35/00/9381  . Cold sore 10/21/2016  . Hyperlipidemia 07/30/2016  . Erectile dysfunction 07/30/2016  . Leg pain, anterior, unspecified laterality 07/30/2016  . 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 Osteoarthritis of both hips 07/30/2016  . Abnormal EKG 07/30/2016  . Right-sided low back pain without sciatica 10/13/2015  . Obesity 10/13/2015  . Essential hypertension 10/13/2015  . Decreased range of hip movement 10/13/2015  . Right hip pain 02/21/2014  . Numbness of left hand 02/07/2014  . Pain of right thumb 02/07/2014    Past Surgical History:  Procedure Laterality Date  . COLONOSCOPY  07/21/2017   tubular adenoma, Dr. Wilfrid Lund  . WISDOM TOOTH EXTRACTION          Home Medications    Prior to Admission medications   Medication Sig Start Date End Date Taking? Authorizing Provider  aspirin EC 81 MG tablet Take 1 tablet (81 mg total) by mouth daily. 11/02/17   Tysinger, Camelia Eng, PA-C  erythromycin ophthalmic ointment Place a 1/2 inch ribbon of ointment 4 times a day into the lower eyelid. 12/16/17   Tasia Catchings, Amy V, PA-C  furosemide (LASIX) 20 MG tablet Take 1 tablet (20 mg total) by mouth daily. 07/31/17   Ward, Delice Bison, DO  ibuprofen (ADVIL,MOTRIN) 800 MG tablet Take 1 tablet (800 mg total) by mouth every 8 (eight) hours as needed. 10/13/16   Tysinger, Camelia Eng, PA-C  lisinopril-hydrochlorothiazide (PRINZIDE,ZESTORETIC) 20-25 MG tablet Take 1 tablet by mouth daily. 06/13/17   Tysinger, Camelia Eng, PA-C  pravastatin (PRAVACHOL) 20 MG tablet Take 1 tablet (20 mg total) by mouth every evening. 11/02/17 11/02/18  Tysinger, Camelia Eng, PA-C  sildenafil (REVATIO) 20 MG tablet 1-5 tablets (20 mg to 100 mg) prior to sexual activity daily prn 09/29/16   Tysinger, Camelia Eng, PA-C  valACYclovir (VALTREX) 1000 MG tablet TAKE 2 TABLETS BY MOUTH TWICE DAILY FOR 1 DAY FOR COLD SORE OR THREE TIMES DAILY FOR 1 WEEK FOR SHINGLES 12/29/17   Tysinger, Camelia Eng, PA-C    Family History Family History  Problem Relation Age of Onset  . Diabetes  Mother   . Stroke Mother   . Diabetes Sister   . Asthma Daughter   . Colon polyps Neg Hx   . Esophageal cancer Neg Hx   . Rectal cancer Neg Hx   . Stomach cancer Neg Hx   . Cancer Neg Hx   . Heart disease Neg Hx   . Colon cancer Neg Hx   . Liver cancer Neg Hx   . Pancreatic cancer Neg Hx     Social History Social History   Tobacco Use  . Smoking status: Never Smoker  . Smokeless tobacco: Never Used  Substance Use Topics  . Alcohol use: Yes    Alcohol/week: 3.0 standard drinks    Types: 3 Cans of beer per week  . Drug use: No     Allergies   Patient has no known allergies.   Review of Systems Review of Systems   Constitutional: Negative for chills and fever.  HENT: Positive for ear pain ( left) and sore throat.   Eyes: Negative for pain and visual disturbance.  Respiratory: Negative for cough and shortness of breath.   Cardiovascular: Negative for chest pain and palpitations.  Gastrointestinal: Negative for abdominal pain and vomiting.  Genitourinary: Negative for dysuria and hematuria.  Musculoskeletal: Negative for arthralgias and back pain.  Skin: Negative for color change and rash.  Neurological: Negative for seizures and syncope.  All other systems reviewed and are negative.    Physical Exam Triage Vital Signs ED Triage Vitals  Enc Vitals Group     BP 04/01/18 1043 (!) 148/89     Pulse Rate 04/01/18 1043 79     Resp 04/01/18 1043 18     Temp 04/01/18 1043 98.7 F (37.1 C)     Temp Source 04/01/18 1043 Oral     SpO2 04/01/18 1043 99 %     Weight --      Height --      Head Circumference --      Peak Flow --      Pain Score 04/01/18 1052 10     Pain Loc --      Pain Edu? --      Excl. in [place]? --    No data found.  Updated Vital Signs BP (!) 148/89 (BP Location: Left Arm)   Pulse 79   Temp 98.7 F (37.1 C) (Oral)   Resp 18   SpO2 99%   Visual Acuity Right Eye Distance:   Left Eye Distance:   Bilateral Distance:    Right Eye Near:   Left Eye Near:    Bilateral Near:     Physical Exam  Constitutional: He is oriented to person, place, and time. He appears well-developed and well-nourished.  HENT:  Head: Normocephalic.  Mouth/Throat: Mucous membranes are normal.  Effusion noted to left ear. Post nasal drip visulaized  Neck: Normal range of motion.  Cardiovascular: Normal rate and regular rhythm.  Pulmonary/Chest: Effort normal and breath sounds normal.  Musculoskeletal: Normal range of motion.  Neurological: He is alert and oriented to person, place, and time.  Skin: Skin is dry.  Psychiatric: He has a normal mood and affect.  Nursing note and vitals  reviewed.    UC Treatments / Results  Labs (all labs ordered are listed, but only abnormal results are displayed) Labs Reviewed  CULTURE, GROUP A STREP [facility])  POCT RAPID STREP A    EKG None  Radiology No results found.  Procedures Procedures (including critical care time)  Medications Ordered in UC Medications - No data to display  Initial Impression / Assessment and Plan / UC Course  I have reviewed the triage vital signs and the nursing notes.  Pertinent labs & imaging results that were available during my care of the patient were reviewed by me and considered in my medical decision making (see chart for details).      Final Clinical Impressions(s) / UC Diagnoses   Final diagnoses:  None   Discharge Instructions   None    ED Prescriptions    None     Controlled Substance Prescriptions [place] Controlled Substance Registry consulted? Not Applicable   Jacqualine Mau, NP 04/01/18 [place]    Jacqualine Mau, NP 04/01/18 1132

## 2018-04-04 LAB — CULTURE, GROUP A STREP (THRC)

## 2018-05-18 ENCOUNTER — Ambulatory Visit: Payer: BC Managed Care – PPO | Admitting: Medical

## 2018-05-26 ENCOUNTER — Telehealth (INDEPENDENT_AMBULATORY_CARE_PROVIDER_SITE_OTHER): Payer: Self-pay | Admitting: Physical Medicine and Rehabilitation

## 2018-05-26 NOTE — Telephone Encounter (Signed)
We did left las time see which one he is asking about, have done both in past

## 2018-05-26 NOTE — Telephone Encounter (Signed)
Patient states that hip was really hurting yesterday, but he is better today and wants to wait on injection.

## 2018-06-15 ENCOUNTER — Encounter (INDEPENDENT_AMBULATORY_CARE_PROVIDER_SITE_OTHER): Payer: Self-pay | Admitting: Physical Medicine and Rehabilitation

## 2018-06-15 ENCOUNTER — Ambulatory Visit (INDEPENDENT_AMBULATORY_CARE_PROVIDER_SITE_OTHER): Payer: BC Managed Care – PPO | Admitting: Physical Medicine and Rehabilitation

## 2018-06-15 ENCOUNTER — Ambulatory Visit (INDEPENDENT_AMBULATORY_CARE_PROVIDER_SITE_OTHER): Payer: Self-pay

## 2018-06-15 DIAGNOSIS — M25551 Pain in right hip: Secondary | ICD-10-CM | POA: Diagnosis not present

## 2018-06-15 MED ORDER — TRIAMCINOLONE ACETONIDE 40 MG/ML IJ SUSP
80.0000 mg | INTRAMUSCULAR | Status: AC | PRN
  Administered 2018-06-15: 80 mg via INTRA_ARTICULAR

## 2018-06-15 MED ORDER — BUPIVACAINE HCL 0.5 % IJ SOLN
3.0000 mL | INTRAMUSCULAR | Status: AC | PRN
  Administered 2018-06-15: 3 mL via INTRA_ARTICULAR

## 2018-06-15 NOTE — Progress Notes (Signed)
   Wesley Lawson - 63 y.o. male MRN [id number]  Date of birth: August 22, 1955  Office Visit Note: Visit Date: 06/15/2018 PCP: Carlena Hurl, PA-C Referred by: Carlena Hurl, PA-C  Subjective: Chief Complaint  Patient presents with  . Right Hip - Pain   HPI:  Wesley Lawson is a 63 y.o. male who comes in today For likely repeat right intra-articular hip injection fluoroscopic guidance.  Patient had this completed last year on the right side and did extremely well he is also had left-sided injections.  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  He does take ibuprofen with some relief.  Symptoms started a couple months ago without any trauma.  Pain is in the groin and right hip with rotation.  Rates his pain as a 9 out of 10.  We talked him about over-the-counter glucosamine and tumeric and he had questions about CBD oil which we did talk about.  We did complete the injection today and he did get good relief during the anesthetic phase.  In the future would be a good candidate for hip replacement depending on symptoms.  Exam shows very stiff right hip with rotation.  ROS Otherwise per HPI.  Assessment & Plan: Visit Diagnoses:  1. Pain in right hip     Plan: No additional findings.   Meds & Orders: No orders of the defined types were placed in this encounter.   Orders Placed This Encounter  Procedures  . Large Joint Inj: R hip joint  . XR C-ARM NO REPORT    Follow-up: Return if symptoms worsen or fail to improve.   Procedures: Large Joint Inj: R hip joint on 06/15/2018 9:16 AM Indications: diagnostic evaluation and pain Details: 22 G 3.5 in needle, fluoroscopy-guided anterior approach  Arthrogram: No  Medications: 80 mg triamcinolone acetonide 40 MG/ML; 3 mL bupivacaine 0.5 % Outcome: tolerated well, no immediate complications  There was excellent flow of contrast producing a partial arthrogram of the hip. The patient did have relief  of symptoms during the anesthetic phase of the injection. Procedure, treatment alternatives, risks and benefits explained, specific risks discussed. Consent was given by the patient. Immediately prior to procedure a time out was called to verify the correct patient, procedure, equipment, support staff and site/side marked as required. Patient was prepped and draped in the usual sterile fashion.      No notes on file   Clinical History: No specialty comments available.     Objective:  VS:  HT:    WT:   BMI:     BP:   HR: bpm  TEMP: ( )  RESP:  Physical Exam  Ortho Exam Imaging: Xr C-arm No Report  Result Date: 06/15/2018 Please see Notes tab for imaging impression.

## 2018-06-15 NOTE — Progress Notes (Signed)
 .  Numeric Pain Rating Scale and Functional Assessment Average Pain 9   In the last MONTH (on 0-10 scale) has pain interfered with the following?  1. General activity like being  able to carry out your everyday physical activities such as walking, climbing stairs, carrying groceries, or moving a chair?  Rating(6)   -Dye Allergies.  

## 2018-06-29 ENCOUNTER — Telehealth (INDEPENDENT_AMBULATORY_CARE_PROVIDER_SITE_OTHER): Payer: Self-pay | Admitting: Physical Medicine and Rehabilitation

## 2018-06-29 NOTE — Telephone Encounter (Signed)
Ok one time to repeat Left hip injection and talk to him about options

## 2018-06-29 NOTE — Telephone Encounter (Signed)
Scheduled for 07/07/18 at [place].

## 2018-07-07 ENCOUNTER — Encounter (INDEPENDENT_AMBULATORY_CARE_PROVIDER_SITE_OTHER): Payer: Self-pay | Admitting: Physical Medicine and Rehabilitation

## 2018-07-07 ENCOUNTER — Ambulatory Visit (INDEPENDENT_AMBULATORY_CARE_PROVIDER_SITE_OTHER): Payer: Self-pay

## 2018-07-07 ENCOUNTER — Ambulatory Visit (INDEPENDENT_AMBULATORY_CARE_PROVIDER_SITE_OTHER): Payer: BC Managed Care – PPO | Admitting: Physical Medicine and Rehabilitation

## 2018-07-07 DIAGNOSIS — M25552 Pain in left hip: Secondary | ICD-10-CM

## 2018-07-07 MED ORDER — DICLOFENAC SODIUM 75 MG PO TBEC: DELAYED_RELEASE_TABLET | ORAL | 0 refills | Status: DC

## 2018-07-07 MED ORDER — TRIAMCINOLONE ACETONIDE 40 MG/ML IJ SUSP
80.0000 mg | INTRAMUSCULAR | Status: AC | PRN
  Administered 2018-07-07: 80 mg via INTRA_ARTICULAR

## 2018-07-07 MED ORDER — BUPIVACAINE HCL 0.25 % IJ SOLN
4.0000 mL | INTRAMUSCULAR | Status: AC | PRN
  Administered 2018-07-07: 4 mL via INTRA_ARTICULAR

## 2018-07-07 NOTE — Progress Notes (Signed)
   Wesley Lawson - 63 y.o. male MRN [id number]  Date of birth: August 24, 1955  Office Visit Note: Visit Date: 07/07/2018 PCP: Carlena Hurl, PA-C Referred by: Carlena Hurl, PA-C  Subjective: Chief Complaint  Patient presents with  . Left Hip - Pain   HPI:  Wesley Lawson is a 63 y.o. male who comes in today For planned left intra-articular hip injection fluoroscopic guidance.  Patient's last injection was in October and he did well up until just a couple weeks ago.  We have been alternating right and left hips knees really only getting about 3 in a year for each hip.  Has pretty significant osteoarthritis.  We will prescribe diclofenac to be used only at 2-week intervals he does have some high blood pressure.  He is been using some ibuprofen off and on.  Also asking to use Tylenol 3 times a day extra strength.  He will follow-up with Dr. Ninfa Linden depending on results.  ROS Otherwise per HPI.  Assessment & Plan: Visit Diagnoses:  1. Pain in left hip     Plan: No additional findings.   Meds & Orders:  Meds ordered this encounter  Medications  . diclofenac (VOLTAREN) 75 MG EC tablet    Sig: Take with food. Use only two weeks at a time.    Dispense:  60 tablet    Refill:  0    Orders Placed This Encounter  Procedures  . Large Joint Inj: L hip joint  . XR C-ARM NO REPORT    Follow-up: Return if symptoms worsen or fail to improve.   Procedures: Large Joint Inj: L hip joint on 07/07/2018 8:48 AM Indications: diagnostic evaluation and pain Details: 22 G 3.5 in needle, fluoroscopy-guided anterior approach  Arthrogram: No  Medications: 80 mg triamcinolone acetonide 40 MG/ML; 4 mL bupivacaine 0.25 % Outcome: tolerated well, no immediate complications  There was excellent flow of contrast producing a partial arthrogram of the hip. The patient did have relief of symptoms during the anesthetic phase of the injection. Procedure, treatment alternatives, risks and benefits  explained, specific risks discussed. Consent was given by the patient. Immediately prior to procedure a time out was called to verify the correct patient, procedure, equipment, support staff and site/side marked as required. Patient was prepped and draped in the usual sterile fashion.      No notes on file   Clinical History: No specialty comments available.     Objective:  VS:  HT:    WT:   BMI:     BP:   HR: bpm  TEMP: ( )  RESP:  Physical Exam  Ortho Exam Imaging: No results found.

## 2018-07-07 NOTE — Patient Instructions (Signed)

## 2018-07-07 NOTE — Progress Notes (Signed)
 .  Numeric Pain Rating Scale and Functional Assessment Average Pain 10   In the last MONTH (on 0-10 scale) has pain interfered with the following?  1. General activity like being  able to carry out your everyday physical activities such as walking, climbing stairs, carrying groceries, or moving a chair?  Rating(5)   -Dye Allergies.  

## 2018-07-21 ENCOUNTER — Encounter: Payer: BC Managed Care – PPO | Admitting: Medical

## 2018-07-27 ENCOUNTER — Ambulatory Visit (INDEPENDENT_AMBULATORY_CARE_PROVIDER_SITE_OTHER): Payer: BC Managed Care – PPO | Admitting: Medical

## 2018-07-27 ENCOUNTER — Encounter: Payer: Self-pay | Admitting: Medical

## 2018-07-27 VITALS — BP 110/62 | HR 81 | Temp 97.8°F | Ht 70.0 in | Wt 240.0 lb

## 2018-07-27 DIAGNOSIS — J988 Other specified respiratory disorders: Secondary | ICD-10-CM

## 2018-07-27 MED ORDER — FLUTICASONE PROPIONATE 50 MCG/ACT NA SUSP: 1.0000 | Freq: Every day | NASAL | 2 refills | Status: DC

## 2018-07-27 MED ORDER — HYDROCODONE-HOMATROPINE 5-1.5 MG/5ML PO SYRP: 5.0000 mL | ORAL_SOLUTION | Freq: Three times a day (TID) | ORAL | 0 refills | Status: AC | PRN

## 2018-07-27 NOTE — Progress Notes (Signed)
Subjective: Chief Complaint  Patient presents with  . chest congestion    scratchy throat and loss of vocie    Here for illness.  She reports 5 day hx/o cough, hoarse voice, chest congestion.  Started with sore throat, then now mostly hoarse voice and losing voice.  No NVD, no body aches or chills.  Has had sweats.   Cough is nonproductive.   Using robitussin for cough.  No sick contacts.   Nonsmoker  No other aggravating or relieving factors. No other complaint.  Past Medical History:  Diagnosis Date  . Abnormal EKG   . Allergy   . Arthritis    hands, knees  . Chest pain    a. normal echo stress test in 2010 with mild to moderate LVH.   Marland Kitchen Dyslipidemia   . GERD (gastroesophageal reflux disease)   . History of exercise stress test 08/2016   normal  . Hyperlipidemia   . Hypertension    Current Outpatient Medications on File Prior to Visit  Medication Sig Dispense Refill  . erythromycin ophthalmic ointment Place a 1/2 inch ribbon of ointment 4 times a day into the lower eyelid. 1 g 0  . furosemide (LASIX) 20 MG tablet Take 1 tablet (20 mg total) by mouth daily. 4 tablet 0  . ibuprofen (ADVIL,MOTRIN) 800 MG tablet Take 1 tablet (800 mg total) by mouth every 8 (eight) hours as needed. 30 tablet 0  . lisinopril-hydrochlorothiazide (PRINZIDE,ZESTORETIC) 20-25 MG tablet Take 1 tablet by mouth daily. 90 tablet 3  . sildenafil (REVATIO) 20 MG tablet 1-5 tablets (20 mg to 100 mg) prior to sexual activity daily prn 50 tablet 2  . valACYclovir (VALTREX) 1000 MG tablet TAKE 2 TABLETS BY MOUTH TWICE DAILY FOR 1 DAY FOR COLD SORE OR THREE TIMES DAILY FOR 1 WEEK FOR SHINGLES 30 tablet 0  . aspirin EC 81 MG tablet Take 1 tablet (81 mg total) by mouth daily. (Patient not taking: Reported on 07/27/2018) 90 tablet 3  . diclofenac (VOLTAREN) 75 MG EC tablet Take with food. Use only two weeks at a time. (Patient not taking: Reported on 07/27/2018) 60 tablet 0  . pravastatin (PRAVACHOL) 20 MG tablet Take 1  tablet (20 mg total) by mouth every evening. (Patient not taking: Reported on 07/27/2018) 90 tablet 3   Current Facility-Administered Medications on File Prior to Visit  Medication Dose Route Frequency Provider Last Rate Last Dose  . 0.9 %  sodium chloride infusion  500 mL Intravenous Once Nelida Meuse III, MD       ROS as in subjective    Objective: BP 110/62   Pulse 81   Temp 97.8 F (36.6 C) (Oral)   Ht 5\' 10"  (1.778 m)   Wt 240 lb (108.9 kg)   SpO2 93%   BMI 34.44 kg/m   General appearance: alert, no distress, WD/WN, hoarse voice HEENT: normocephalic, sclerae anicteric, TMs pearly, nares with turbinated swelling, mucoid discharge,+erythema, pharynx with mild erythema Oral cavity: MMM, no lesions Neck: supple, no lymphadenopathy, no thyromegaly, no masses lungs: CTA bilaterally, no wheezes, rhonchi, or rales     Assessment: Encounter Diagnosis  Name Primary?  Marland Kitchen Respiratory tract infection Yes     Plan: discussed supportive care, voice rest, medications below prn, discussed risks/benefits of medication, and symptoms should gradually resolve over the next 3-4 days.  Desmin was seen today for chest congestion.  Diagnoses and all orders for this visit:  Respiratory tract infection  Other orders -  fluticasone (FLONASE) 50 MCG/ACT nasal spray; Place 1 spray into both nostrils daily. -     HYDROcodone-homatropine (HYCODAN) 5-1.5 MG/5ML syrup; Take 5 mLs by mouth every 8 (eight) hours as needed for up to 5 days.

## 2018-08-07 ENCOUNTER — Other Ambulatory Visit: Payer: Self-pay | Admitting: Medical

## 2018-11-03 ENCOUNTER — Other Ambulatory Visit: Payer: Self-pay | Admitting: Medical

## 2018-11-20 ENCOUNTER — Telehealth: Payer: Self-pay | Admitting: Physical Medicine and Rehabilitation

## 2018-11-20 NOTE — Telephone Encounter (Signed)
ok 

## 2018-11-21 NOTE — Telephone Encounter (Signed)
Scheduled for 6/18 at [place].

## 2018-11-30 ENCOUNTER — Ambulatory Visit (INDEPENDENT_AMBULATORY_CARE_PROVIDER_SITE_OTHER): Payer: BC Managed Care – PPO | Admitting: Physical Medicine and Rehabilitation

## 2018-11-30 ENCOUNTER — Other Ambulatory Visit: Payer: Self-pay

## 2018-11-30 ENCOUNTER — Ambulatory Visit: Payer: Self-pay

## 2018-11-30 ENCOUNTER — Encounter: Payer: Self-pay | Admitting: Physical Medicine and Rehabilitation

## 2018-11-30 DIAGNOSIS — M25552 Pain in left hip: Secondary | ICD-10-CM | POA: Diagnosis not present

## 2018-11-30 DIAGNOSIS — M25551 Pain in right hip: Secondary | ICD-10-CM

## 2018-11-30 MED ORDER — TRIAMCINOLONE ACETONIDE 40 MG/ML IJ SUSP
60.0000 mg | INTRAMUSCULAR | Status: AC | PRN
  Administered 2018-11-30: 60 mg via INTRA_ARTICULAR

## 2018-11-30 MED ORDER — BUPIVACAINE HCL 0.25 % IJ SOLN
4.0000 mL | INTRAMUSCULAR | Status: AC | PRN
  Administered 2018-11-30: 4 mL via INTRA_ARTICULAR

## 2018-11-30 NOTE — Progress Notes (Signed)
   [name] - 63 y.o. male MRN [id number]  Date of birth: Jan 28, 1956  Office Visit Note: Visit Date: 11/30/2018 PCP: Carlena Hurl, PA-C Referred by: Carlena Hurl, PA-C  Subjective: Chief Complaint  Patient presents with  . Right Hip - Pain  . Left Hip - Pain   HPI:  Wesley Lawson is a 63 y.o. male who comes in today For planned bilateral intra-articular hip injections for bilateral hip and groin pain.  Patient has significant osteoarthritis and some femoral acetabular impingement type situation with both hips.  He is followed by Dr. Jean Rosenthal.  We last saw the patient in January completed left-sided injection and he has had left and right injections at various times.  He did get good relief in his symptoms just returned recently.  No specific trauma.  No radicular pains or paresthesias.  We will repeat bilateral intra-articular hip injection fluoroscopic guidance diagnostically hopefully therapeutically.  He will continue to follow with Dr. Jean Rosenthal.  Patient likely would do well with bilateral hip implants.  ROS Otherwise per HPI.  Assessment & Plan: Visit Diagnoses:  1. Pain in left hip   2. Pain in right hip     Plan: No additional findings.   Meds & Orders: No orders of the defined types were placed in this encounter.   Orders Placed This Encounter  Procedures  . Large Joint Inj: bilateral hip joint  . XR C-ARM NO REPORT    Follow-up: No follow-ups on file.   Procedures: Large Joint Inj: bilateral hip joint on 11/30/2018 11:43 AM Indications: diagnostic evaluation and pain Details: 22 G 3.5 in needle, fluoroscopy-guided anterior approach  Arthrogram: No  Medications (Right): 60 mg triamcinolone acetonide 40 MG/ML; 4 mL bupivacaine 0.25 % Medications (Left): 60 mg triamcinolone acetonide 40 MG/ML; 4 mL bupivacaine 0.25 % Outcome: tolerated well, no immediate complications  There was excellent flow of contrast producing a partial  arthrogram of the hip. The patient did have relief of symptoms during the anesthetic phase of the injection. Procedure, treatment alternatives, risks and benefits explained, specific risks discussed. Consent was given by the patient. Immediately prior to procedure a time out was called to verify the correct patient, procedure, equipment, support staff and site/side marked as required. Patient was prepped and draped in the usual sterile fashion.      No notes on file   Clinical History: No specialty comments available.     Objective:  VS:  HT:    WT:   BMI:     BP:   HR: bpm  TEMP: ( )  RESP:  Physical Exam  Ortho Exam Imaging: Xr C-arm No Report  Result Date: 11/30/2018 Please see Notes tab for imaging impression.

## 2018-11-30 NOTE — Progress Notes (Signed)
 .  Numeric Pain Rating Scale and Functional Assessment Average Pain 10   In the last MONTH (on 0-10 scale) has pain interfered with the following?  1. General activity like being  able to carry out your everyday physical activities such as walking, climbing stairs, carrying groceries, or moving a chair?  Rating(7)    -Dye Allergies.  

## 2019-02-14 ENCOUNTER — Other Ambulatory Visit: Payer: Self-pay | Admitting: Medical

## 2019-03-19 ENCOUNTER — Telehealth: Payer: Self-pay | Admitting: Physical Medicine and Rehabilitation

## 2019-03-19 NOTE — Telephone Encounter (Signed)
ok 

## 2019-03-20 NOTE — Telephone Encounter (Signed)
Scheduled for 10/21

## 2019-04-04 ENCOUNTER — Other Ambulatory Visit: Payer: Self-pay

## 2019-04-04 ENCOUNTER — Encounter: Payer: Self-pay | Admitting: Physical Medicine and Rehabilitation

## 2019-04-04 ENCOUNTER — Ambulatory Visit: Payer: Self-pay

## 2019-04-04 ENCOUNTER — Ambulatory Visit (INDEPENDENT_AMBULATORY_CARE_PROVIDER_SITE_OTHER): Payer: BC Managed Care – PPO | Admitting: Physical Medicine and Rehabilitation

## 2019-04-04 DIAGNOSIS — M25551 Pain in right hip: Secondary | ICD-10-CM

## 2019-04-04 NOTE — Progress Notes (Signed)
 ..  Numeric Pain Rating Scale and Functional Assessment Average Pain 8   In the last MONTH (on 0-10 scale) has pain interfered with the following?  1. General activity like being  able to carry out your everyday physical activities such as walking, climbing stairs, carrying groceries, or moving a chair?  Rating(8)    -Dye Allergies.  

## 2019-04-05 DIAGNOSIS — M25551 Pain in right hip: Secondary | ICD-10-CM

## 2019-04-05 MED ORDER — TRIAMCINOLONE ACETONIDE 40 MG/ML IJ SUSP
80.0000 mg | INTRAMUSCULAR | Status: AC | PRN
  Administered 2019-04-05: 80 mg via INTRA_ARTICULAR

## 2019-04-05 MED ORDER — BUPIVACAINE HCL 0.25 % IJ SOLN
4.0000 mL | INTRAMUSCULAR | Status: AC | PRN
  Administered 2019-04-05: 4 mL via INTRA_ARTICULAR

## 2019-04-05 NOTE — Progress Notes (Signed)
Wesley Lawson - 63 y.o. male MRN [id number]  Date of birth: 28-May-1956  Office Visit Note: Visit Date: 04/04/2019 PCP: Carlena Hurl, PA-C Referred by: Carlena Hurl, PA-C  Subjective: Chief Complaint  Patient presents with  . Right Hip - Pain  . Right Thigh - Pain   HPI: Wesley Lawson is a 63 y.o. male who comes in today For planned repeat right intra-articular hip injection fluoroscopic guidance.  Patient's last injection was 11/30/2018.  He got good relief of it is just a few weeks ago.  He is having right hip and groin pain consistent with hip pathology.  He has a least in my opinion end-stage arthritis of the hips.  Last time he saw Dr. Ninfa Linden was June of last year.  We have limited his injections to about every 4 months or so.  He asked today about disability questions in terms of hip arthritis and his work.  I told him that he really needs to follow-up with Dr. Ninfa Linden to discuss those issues.  I did tell him my opinion would be at the age of 39 and being pretty active I would consider without question anterior hip replacement.  I think prior to the next time he hurts we need to get him a follow-up with Dr. Ninfa Linden.  ROS Otherwise per HPI.  Assessment & Plan: Visit Diagnoses:  1. Pain in right hip     Plan: No additional findings.   Meds & Orders: No orders of the defined types were placed in this encounter.   Orders Placed This Encounter  Procedures  . Large Joint Inj  . XR C-ARM NO REPORT    Follow-up: No follow-ups on file.   Procedures: Large Joint Inj: R hip joint on 04/05/2019 6:03 AM Indications: pain and diagnostic evaluation Details: 22 G needle, anterior approach  Arthrogram: Yes  Medications: 80 mg triamcinolone acetonide 40 MG/ML; 4 mL bupivacaine 0.25 % Outcome: tolerated well, no immediate complications  Arthrogram demonstrated excellent flow of contrast throughout the joint surface without extravasation or obvious defect.  The patient had  relief of symptoms during the anesthetic phase of the injection.  Procedure, treatment alternatives, risks and benefits explained, specific risks discussed. Consent was given by the patient. Immediately prior to procedure a time out was called to verify the correct patient, procedure, equipment, support staff and site/side marked as required. Patient was prepped and draped in the usual sterile fashion.      No notes on file   Clinical History: No specialty comments available.   He reports that he has never smoked. He has never used smokeless tobacco. No results for input(s): HGBA1C, LABURIC in the last 8760 hours.  Objective:  VS:  HT:    WT:   BMI:     BP:   HR: bpm  TEMP: ( )  RESP:  Physical Exam Musculoskeletal:     Comments: Patient ambulates with an antalgic gait to the right no Trendelenburg sign.  He has real stiff hip on the right with internal rotation pain which is consistent with his pain.     Ortho Exam Imaging: Xr C-arm No Report  Result Date: 04/04/2019 Please see Notes tab for imaging impression.   Past Medical/Family/Surgical/Social History: Medications & Allergies reviewed per EMR, new medications updated. Patient Active Problem List   Diagnosis Date Noted  . Tubular adenoma 11/02/2017  . Screen for colon cancer 06/13/2017  . Screening for prostate cancer 06/13/2017  . Encounter for health  maintenance examination in adult 06/13/2017  . Muscle tightness 06/13/2017  . Rash 10/21/2016  . Herpes zoster without complication [id number]  . Cold sore 10/21/2016  . Hyperlipidemia 07/30/2016  . Erectile dysfunction 07/30/2016  . Leg pain, anterior, unspecified laterality 07/30/2016  . 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 Osteoarthritis of both hips 07/30/2016  . Abnormal EKG 07/30/2016  . Right-sided low back pain without sciatica 10/13/2015  . Obesity 10/13/2015  . Essential hypertension 10/13/2015  . Decreased range of hip movement 10/13/2015  .  Right hip pain 02/21/2014  . Numbness of left hand 02/07/2014  . Pain of right thumb 02/07/2014   Past Medical History:  Diagnosis Date  . Abnormal EKG   . Allergy   . Arthritis    hands, knees  . Chest pain    a. normal echo stress test in 2010 with mild to moderate LVH.   Marland Kitchen Dyslipidemia   . GERD (gastroesophageal reflux disease)   . History of exercise stress test 08/2016   normal  . Hyperlipidemia   . Hypertension    Family History  Problem Relation Age of Onset  . Diabetes Mother   . Stroke Mother   . Diabetes Sister   . Asthma Daughter   . Colon polyps Neg Hx   . Esophageal cancer Neg Hx   . Rectal cancer Neg Hx   . Stomach cancer Neg Hx   . Cancer Neg Hx   . Heart disease Neg Hx   . Colon cancer Neg Hx   . Liver cancer Neg Hx   . Pancreatic cancer Neg Hx    Past Surgical History:  Procedure Laterality Date  . COLONOSCOPY  07/21/2017   tubular adenoma, Dr. Wilfrid Lund  . WISDOM TOOTH EXTRACTION     Social History   Occupational History  . Not on file  Tobacco Use  . Smoking status: Never Smoker  . Smokeless tobacco: Never Used  Substance and Sexual Activity  . Alcohol use: Yes    Alcohol/week: 3.0 standard drinks    Types: 3 Cans of beer per week  . Drug use: No  . Sexual activity: Yes

## 2019-04-11 ENCOUNTER — Other Ambulatory Visit: Payer: Self-pay

## 2019-04-11 ENCOUNTER — Ambulatory Visit (INDEPENDENT_AMBULATORY_CARE_PROVIDER_SITE_OTHER): Payer: BC Managed Care – PPO | Admitting: Medical

## 2019-04-11 ENCOUNTER — Encounter: Payer: Self-pay | Admitting: Medical

## 2019-04-11 VITALS — BP 112/78 | HR 87 | Temp 98.4°F | Ht 70.0 in | Wt 242.6 lb

## 2019-04-11 DIAGNOSIS — H9192 Unspecified hearing loss, left ear: Secondary | ICD-10-CM | POA: Diagnosis not present

## 2019-04-11 DIAGNOSIS — H6122 Impacted cerumen, left ear: Secondary | ICD-10-CM | POA: Insufficient documentation

## 2019-04-11 DIAGNOSIS — L918 Other hypertrophic disorders of the skin: Secondary | ICD-10-CM | POA: Diagnosis not present

## 2019-04-11 DIAGNOSIS — H9202 Otalgia, left ear: Secondary | ICD-10-CM | POA: Diagnosis not present

## 2019-04-11 DIAGNOSIS — D179 Benign lipomatous neoplasm, unspecified: Secondary | ICD-10-CM

## 2019-04-11 DIAGNOSIS — H9203 Otalgia, bilateral: Secondary | ICD-10-CM | POA: Insufficient documentation

## 2019-04-11 NOTE — Progress Notes (Signed)
Subjective:  Wesley Lawson is a 63 y.o. male who presents for Chief Complaint  Patient presents with  . Ear Problem    can't hear out left ear for 2 weeks       Here for ear discomfort and decreased hearing left ear.  2 weeks ago was having some hearing issues, went to health clinic at [facility], had wax flushed out of right ear but they could not get all the wax out of the left ear.  Was given ear drop which he has been using for wax.  Still feels like he is stopped up in the left ear.  No sore throat no cough no runny nose no congestion.  No fever.  He has questions about skin lesions of his neck in both armpits.  He also has questions about a fatty lump under the left armpit.  No other aggravating or relieving factors.    No other c/o.  The following portions of the patient's history were reviewed and updated as appropriate: allergies, current medications, past family history, past medical history, past social history, past surgical history and problem list.  ROS Otherwise as in subjective above  Objective: BP 112/78   Pulse 87   Temp 98.4 F (36.9 C)   Ht 5\' 10"  (1.778 m)   Wt 242 lb 9.6 oz (110 kg)   SpO2 96%   BMI 34.81 kg/m   General appearance: alert, no distress, well developed, well nourished HEENT: normocephalic, sclerae anicteric, conjunctiva pink and moist, right TM pearly, ear canal clear, left ear canal with impacted cerumen,, nares patent, no discharge or erythema, pharynx normal Oral cavity: MMM, no lesions Neck: supple, no lymphadenopathy, no thyromegaly, no masses Skin: 2 moderate sized pedunculated benign-appearing skin tags of the right axilla, 2 other smaller raised skin tags, left axilla with about 7 pedunculated benign-appearing skin tags, several similar benign appearing smaller skin tags circumferential around the neck.  There is a 4 cm diameter spongy fatty raised area in the left axilla likely lipoma with overlying some sweat  glands.   Assessment: Encounter Diagnoses  Name Primary?  . Decreased hearing of left ear Yes  . Otalgia, left   . Impacted cerumen of left ear   . Skin tag   . Lipoma, unspecified site      Plan: Discussed findings.  Discussed risk/benefits of procedure and patient agrees to procedure. Successfully used warm water lavage to remove impacted cerumen from left ear canal. Patient tolerated procedure well. Advised they avoid using any cotton swabs or other devices to clean the ear canals.  Use basic hygiene as discussed.  Follow up prn.   We discussed the skin tags, we discussed the fact that they are benign and not worrisome.  We gave him a cash pay price in the event insurance does not cover this.  However he has 3 different insurance policies.  He is going to check insurance about coverage first.  If desired he can return for excision  We also discussed the lipoma fatty tissue in the left axilla.  This is not bothering him so we will leave this alone  [name] was seen today for ear problem.  Diagnoses and all orders for this visit:  Decreased hearing of left ear  Otalgia, left  Impacted cerumen of left ear  Skin tag  Lipoma, unspecified site    Follow up: prn

## 2019-04-25 ENCOUNTER — Ambulatory Visit (INDEPENDENT_AMBULATORY_CARE_PROVIDER_SITE_OTHER): Payer: BC Managed Care – PPO | Admitting: Family Medicine

## 2019-04-25 ENCOUNTER — Other Ambulatory Visit: Payer: Self-pay

## 2019-04-25 ENCOUNTER — Encounter: Payer: Self-pay | Admitting: Family Medicine

## 2019-04-25 VITALS — BP 114/72 | HR 92 | Temp 99.1°F | Ht 70.0 in | Wt 235.0 lb

## 2019-04-25 DIAGNOSIS — N3000 Acute cystitis without hematuria: Secondary | ICD-10-CM

## 2019-04-25 DIAGNOSIS — M545 Low back pain, unspecified: Secondary | ICD-10-CM

## 2019-04-25 DIAGNOSIS — I10 Essential (primary) hypertension: Secondary | ICD-10-CM

## 2019-04-25 DIAGNOSIS — E785 Hyperlipidemia, unspecified: Secondary | ICD-10-CM

## 2019-04-25 DIAGNOSIS — R350 Frequency of micturition: Secondary | ICD-10-CM

## 2019-04-25 LAB — POCT URINALYSIS DIP (PROADVANTAGE DEVICE)
Bilirubin, UA: NEGATIVE
Glucose, UA: NEGATIVE mg/dL
Ketones, POC UA: NEGATIVE mg/dL
Nitrite, UA: NEGATIVE
Protein Ur, POC: 30 mg/dL — AB
Specific Gravity, Urine: 1.015
Urobilinogen, Ur: NEGATIVE
pH, UA: 6 (ref 5.0–8.0)

## 2019-04-25 MED ORDER — PRAVASTATIN SODIUM 20 MG PO TABS: 20.0000 mg | ORAL_TABLET | Freq: Every evening | ORAL | 0 refills | Status: DC

## 2019-04-25 MED ORDER — CIPROFLOXACIN HCL 500 MG PO TABS: 500.0000 mg | ORAL_TABLET | Freq: Two times a day (BID) | ORAL | 0 refills | Status: DC

## 2019-04-25 NOTE — Progress Notes (Signed)
Visit was started as a virtual video visit, but patient ended up coming to office at 4pm to complete the work-up/exam.  Video Start time: 3:23 End time: 3:41 (for video portion). Patient was then asked to come into office, needs physical exam and urine specimen  History of Present Illness: Chief Complaint  Patient presents with  . Abdominal Pain    and lower back pain/kidney pain. Temp of 102. Also having frequent urination, about every 15 minutes. Feels some discomfort when he urinates. Has left ear pain x last 3 days. He did tell me he is taking tylenol and robitussin, I asked him if he was coughin and he said no-he is just taking just in case. He does have chills and body aches.    Felt fine yesterday, went to work.  Last night he was sweating a lot.  Didn't feel well this morning, wife took temperature--was 99 at first, but ended up going up to 102.    He has some pain in the lower back, and having urinary frequency.  He has slight burning when he voids.   Stream is normal, color is light. Not cloudy, no odor. He has urinary frequency, going every hour.  Up 3-4x/night to void over the last week.  Burning with voiding started about a week ago.  H/o urinary tract infection in the past, last about a year ago (per pt, no culture in chart).  He states he has also had prostatitis in the past. He has lab appt with urologist tomorrow.  Took tylenol and fever came dwon to 98.  He is also complaining of L earache x 3 days.  Denies allergy symptoms.  Ran out of pravastatin Hasn't started clomid from Dr. Junious Silk yet. Sees him for low testosterone, ED.  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  No sick contacts.  Has had negative COVID tests x 2, last was a couple of weeks ago. (had a +co-worker).  PMH, PSH, SH reviewed  Outpatient Encounter Medications as of 04/25/2019  Medication Sig Note  . acetaminophen (TYLENOL) 325 MG tablet Take 650 mg by mouth every 6 (six) hours as needed. 04/25/2019: Last  dose 30 min ago  . aspirin EC 81 MG tablet Take 1 tablet (81 mg total) by mouth daily.   Marland Kitchen guaifenesin (ROBITUSSIN) 100 MG/5ML syrup Take 200 mg by mouth 3 (three) times daily as needed for cough.   Marland Kitchen lisinopril-hydrochlorothiazide (ZESTORETIC) 20-25 MG tablet TAKE 1 TABLET BY MOUTH EVERY DAY   . clomiPHENE (CLOMID) 50 MG tablet Take 50 mg by mouth 2 (two) times a week.   . diclofenac (VOLTAREN) 75 MG EC tablet Take with food. Use only two weeks at a time. (Patient not taking: Reported on 07/27/2018)   . fluticasone (FLONASE) 50 MCG/ACT nasal spray Place 1 spray into both nostrils daily. (Patient not taking: Reported on 04/25/2019)   . furosemide (LASIX) 20 MG tablet Take 1 tablet (20 mg total) by mouth daily. (Patient not taking: Reported on 04/11/2019)   . ibuprofen (ADVIL,MOTRIN) 800 MG tablet Take 1 tablet (800 mg total) by mouth every 8 (eight) hours as needed. (Patient not taking: Reported on 04/11/2019)   . pravastatin (PRAVACHOL) 20 MG tablet Take 1 tablet (20 mg total) by mouth every evening. (Patient not taking: Reported on 07/27/2018)   . sildenafil (REVATIO) 20 MG tablet 1-5 tablets (20 mg to 100 mg) prior to sexual activity daily prn (Patient not taking: Reported on 04/25/2019)   . valACYclovir (VALTREX) 1000 MG tablet TAKE  2 TABLETS BY MOUTH TWICE DAILY FOR 1 DAY FOR COLD SORE OR THREE TIMES DAILY FOR 1 WEEK FOR SHINGLES (Patient not taking: Reported on 04/11/2019)   . [DISCONTINUED] erythromycin ophthalmic ointment Place a 1/2 inch ribbon of ointment 4 times a day into the lower eyelid. (Patient not taking: Reported on 04/11/2019)    Facility-Administered Encounter Medications as of 04/25/2019  Medication  . 0.9 %  sodium chloride infusion   No Known Allergies  ROS:  Denies URI symptoms, cough, shortness of breath, chest pain.  Denies nausea, vomiting, diarrhea.  No bleeding, bruising, rash.  Low back pain, no other body aches.  No loss of smell or taste. Left earache. No loss of  hearing.  Observations/Objective:  BP 114/72   Pulse 92   Temp 99.1 F (37.3 C) (Oral)   Ht 5\' 10"  (1.778 m)   Wt 235 lb (106.6 kg)   BMI 33.72 kg/m  T102 earlier (took tylenol about an hour prior to in-office check which read 99.1)  Pleasant, well-appearing male, in no distress HEENT: conjunctiva and sclera are clear, EOMI.  TM's and EAC's are normal bilaterally.  He has slight click noted at the left TMJ Neck: no lymphadenopathy, or mass Heart: regular rate and rhythm Lungs: clear bilaterally Abdomen: Mildly tender at RLQ No spinal or CVA tenderness. Area of discomfort is across the low back, but no tenderness or spasm on exam Prostate is moderately enlarged, right side a little greater than the left.  No nodules or masses.  Prostate is non-boggy and nontender. Heme negative stool  Urine dip: 3+ blood, 1+ protein, 2+ leuks   Assessment and Plan:  Acute cystitis without hematuria - vs early pyelo, cannot r/o appendicitis given tenderness RLQ. To take Cipro BID and contact us if not improving in 1-2 days - Plan: ciprofloxacin (CIPRO) 500 MG tablet  Low back pain, unspecified back pain laterality, unspecified chronicity, unspecified whether sciatica present - Plan: POCT Urinalysis DIP (Proadvantage Device)  Urinary frequency - Plan: Urine culture  Essential hypertension - controlled on current meds  Hyperlipidemia, unspecified hyperlipidemia type - out of meds, past due for f/u. D/W Audelia Acton, restart pravastatin and set up f/u (med check/CPE) - Plan: pravastatin (PRAVACHOL) 20 MG tablet  Given his mild tenderness in RLQ, Ddx discussed, including early appendicitis.  Discussed expected course, and to contact us in 1-2 days if symptoms worsening.  F/u with Audelia Acton for med check/CPE/labs. To let his urologist know about infection, so urine can be rechecked at next visit (not lab visit tomorrow).     Vikki Ports, MD

## 2019-04-25 NOTE — Patient Instructions (Addendum)
Take the antibiotic twice daily. You have an infection in the urinary tract.  A urine culture is being sent. We will let you know if the antibiotics need to be changed based on the results.  Contact us on Friday if you aren't noticing any improvement. Continue to use tylenol as needed for fever or pain. You may also use ibuprofen if needed for ear ache or fever not relieved by tylenol.    I think your ear pain may be related to TMJ issues. There is no ear infection.   Restart the pravastatin.

## 2019-04-28 LAB — URINE CULTURE

## 2019-05-08 ENCOUNTER — Telehealth: Payer: Self-pay

## 2019-05-08 NOTE — Telephone Encounter (Signed)
Pt. Called stating he see's Audelia Acton usually but saw Dr. Tomi Bamberger this time for an UTI, was given medication for the UTI which helped for about 3-4 days but now symptoms are coming back, lower back pain, and frequency with urination, pt. Wanted to know if he just needed a refill on the medication or if he needs to come back in to recheck urine. Please advise.

## 2019-05-09 ENCOUNTER — Other Ambulatory Visit: Payer: Self-pay | Admitting: Medical

## 2019-05-09 DIAGNOSIS — N3000 Acute cystitis without hematuria: Secondary | ICD-10-CM

## 2019-05-09 MED ORDER — CIPROFLOXACIN HCL 500 MG PO TABS: 500.0000 mg | ORAL_TABLET | Freq: Two times a day (BID) | ORAL | 0 refills | Status: DC

## 2019-05-09 NOTE — Telephone Encounter (Signed)
See if he can come in for follow up today with one of [place] to recheck.

## 2019-05-09 NOTE — Telephone Encounter (Signed)
Called pt. LM to let him know that Cipro had sent in and told him to call Monday morning to schedule in office visit recheck of urine.

## 2019-05-09 NOTE — Telephone Encounter (Signed)
I sent 5 more days of Cipro.   This message didn't get handled til late today, so it really would have made more sense to have gotten him in earlier today for visit to recheck and re-examine him in person.  Have him begin Cipro, make f/u appt for Monday when we return in office.

## 2019-05-09 NOTE — Telephone Encounter (Signed)
Called pt. LM to call back to see if he could come back in to recheck urine.

## 2019-05-13 ENCOUNTER — Other Ambulatory Visit: Payer: Self-pay | Admitting: Medical

## 2019-05-14 ENCOUNTER — Ambulatory Visit: Payer: BC Managed Care – PPO | Admitting: Medical

## 2019-05-14 ENCOUNTER — Ambulatory Visit (INDEPENDENT_AMBULATORY_CARE_PROVIDER_SITE_OTHER): Payer: BC Managed Care – PPO | Admitting: Medical

## 2019-05-14 ENCOUNTER — Other Ambulatory Visit: Payer: Self-pay

## 2019-05-14 VITALS — BP 138/88 | HR 86 | Wt 249.8 lb

## 2019-05-14 DIAGNOSIS — R300 Dysuria: Secondary | ICD-10-CM

## 2019-05-14 DIAGNOSIS — N401 Enlarged prostate with lower urinary tract symptoms: Secondary | ICD-10-CM | POA: Diagnosis not present

## 2019-05-14 DIAGNOSIS — H9203 Otalgia, bilateral: Secondary | ICD-10-CM

## 2019-05-14 DIAGNOSIS — R7989 Other specified abnormal findings of blood chemistry: Secondary | ICD-10-CM

## 2019-05-14 LAB — POCT URINALYSIS DIP (PROADVANTAGE DEVICE)
Bilirubin, UA: NEGATIVE
Blood, UA: NEGATIVE
Glucose, UA: NEGATIVE mg/dL
Ketones, POC UA: NEGATIVE mg/dL
Leukocytes, UA: NEGATIVE
Nitrite, UA: NEGATIVE
Protein Ur, POC: NEGATIVE mg/dL
Specific Gravity, Urine: 1.025
Urobilinogen, Ur: NEGATIVE
pH, UA: 6 (ref 5.0–8.0)

## 2019-05-14 MED ORDER — DOXYCYCLINE HYCLATE 100 MG PO TABS: 100.0000 mg | ORAL_TABLET | Freq: Two times a day (BID) | ORAL | 0 refills | Status: DC

## 2019-05-14 MED ORDER — ASPIRIN EC 81 MG PO TBEC: 81.0000 mg | DELAYED_RELEASE_TABLET | Freq: Every day | ORAL | 3 refills | Status: DC

## 2019-05-14 MED ORDER — LISINOPRIL-HYDROCHLOROTHIAZIDE 20-25 MG PO TABS: 1.0000 | ORAL_TABLET | Freq: Every day | ORAL | 0 refills | Status: DC

## 2019-05-14 NOTE — Progress Notes (Signed)
Subjective: Chief Complaint  Patient presents with  . recheck urine    recheck urine. no symptoms   Here for follow-up from visit with Dr. Tomi Bamberger on 04/25/2019.  At that time he was diagnosed with urinary tract infection, urine culture showed E. coli.  He had been having back pain, as well as ear pain and was found to have ear infection as well.  Prior to coming in he is also been having weeks of nocturia 2-3 times per night, some urinary frequency.  He has history of prostatitis.  He thinks he had a urinary tract infection maybe a year ago.  His wife has had no recent symptoms in recent weeks or months.  He denies concern for STD.  He does not practice anal sex.  He notes that he and his wife do not always shower or brush teeth before intercourse or oral sex.  He completed 5 additional days of Cipro followed by the round of additional Cipro I called out last week.  He completed his last dose this morning of Cipro.  In the last few days he still has a little back pain but no additional nocturia, no belly pain no fever, no more ear pain, no abdominal pain, feels much improved.  No URI symptoms.  Sees urology regularly for low testosterone.  Takes Clomid 2 times per week.  Just had a recent PSA that was reportedly normal with urology.  Sees Dr. [facility]ious Silk at [facility].    He is scheduled to come in for physical and fasting labs later this week.  He notes that he needs a refill on his blood pressure pill and aspirin today.  He has no other new complaint.  Past Medical History:  Diagnosis Date  . Abnormal EKG   . Allergy   . Arthritis    hands, knees  . Chest pain    a. normal echo stress test in 2010 with mild to moderate LVH.   Marland Kitchen Dyslipidemia   . GERD (gastroesophageal reflux disease)   . History of exercise stress test 08/2016   normal  . Hyperlipidemia   . Hypertension    Current Outpatient Medications on File Prior to Visit  Medication Sig Dispense Refill  . acetaminophen  (TYLENOL) 325 MG tablet Take 650 mg by mouth every 6 (six) hours as needed.    . clomiPHENE (CLOMID) 50 MG tablet Take 50 mg by mouth 2 (two) times a week.    . diclofenac (VOLTAREN) 75 MG EC tablet Take with food. Use only two weeks at a time. 60 tablet 0  . fluticasone (FLONASE) 50 MCG/ACT nasal spray Place 1 spray into both nostrils daily. 16 g 2  . ibuprofen (ADVIL,MOTRIN) 800 MG tablet Take 1 tablet (800 mg total) by mouth every 8 (eight) hours as needed. 30 tablet 0  . pravastatin (PRAVACHOL) 20 MG tablet Take 1 tablet (20 mg total) by mouth every evening. 90 tablet 0  . sildenafil (REVATIO) 20 MG tablet 1-5 tablets (20 mg to 100 mg) prior to sexual activity daily prn 50 tablet 2  . valACYclovir (VALTREX) 1000 MG tablet TAKE 2 TABLETS BY MOUTH TWICE DAILY FOR 1 DAY FOR COLD SORE OR THREE TIMES DAILY FOR 1 WEEK FOR SHINGLES 30 tablet 0   Current Facility-Administered Medications on File Prior to Visit  Medication Dose Route Frequency Provider Last Rate Last Dose  . 0.9 %  sodium chloride infusion  500 mL Intravenous Once Doran Stabler, MD  ROS as in subjective   Objective: BP 138/88   Pulse 86   Wt 249 lb 12.8 oz (113.3 kg)   BMI 35.84 kg/m   Gen: wd, wn,nad Heart rrr, normal s1, s2, no murmurs Lungs clear Abdomen nontender, no mass, no organomegaly Back nontender     Assessment: Encounter Diagnoses  Name Primary?  . Dysuria Yes  . Benign prostatic hyperplasia with lower urinary tract symptoms, symptom details unspecified   . Low testosterone   . Ear pain, bilateral     Plan: Dysuria, BPH - improved on 10 days of Cipro.   Urine culture + , reviewed.  Prostatitis vs UTI.   I sent Doxycycline antibiotic for hold for patient request to pharmacy in the event he seems to get recurrent symptoms in the next few days suggestive of prostatitis not fully resolved.  BPH, low testosterone - managed by urology.  We will request recent urology records  Ear pain -  resolved   Hani was seen today for recheck urine.  Diagnoses and all orders for this visit:  Dysuria -     POCT Urinalysis DIP (Proadvantage Device)  Benign prostatic hyperplasia with lower urinary tract symptoms, symptom details unspecified  Low testosterone  Ear pain, bilateral  Other orders -     doxycycline (VIBRA-TABS) 100 MG tablet; Take 1 tablet (100 mg total) by mouth 2 (two) times daily. -     lisinopril-hydrochlorothiazide (ZESTORETIC) 20-25 MG tablet; Take 1 tablet by mouth daily. -     aspirin EC 81 MG tablet; Take 1 tablet (81 mg total) by mouth daily.

## 2019-05-15 DIAGNOSIS — R300 Dysuria: Secondary | ICD-10-CM | POA: Insufficient documentation

## 2019-05-15 DIAGNOSIS — R7989 Other specified abnormal findings of blood chemistry: Secondary | ICD-10-CM | POA: Insufficient documentation

## 2019-05-15 DIAGNOSIS — N401 Enlarged prostate with lower urinary tract symptoms: Secondary | ICD-10-CM | POA: Insufficient documentation

## 2019-05-17 ENCOUNTER — Ambulatory Visit (INDEPENDENT_AMBULATORY_CARE_PROVIDER_SITE_OTHER): Payer: BC Managed Care – PPO | Admitting: Medical

## 2019-05-17 ENCOUNTER — Other Ambulatory Visit: Payer: Self-pay

## 2019-05-17 ENCOUNTER — Encounter: Payer: Self-pay | Admitting: Medical

## 2019-05-17 VITALS — BP 122/78 | HR 81 | Temp 98.7°F | Ht 70.0 in | Wt 246.6 lb

## 2019-05-17 DIAGNOSIS — E669 Obesity, unspecified: Secondary | ICD-10-CM

## 2019-05-17 DIAGNOSIS — Z23 Encounter for immunization: Secondary | ICD-10-CM

## 2019-05-17 DIAGNOSIS — M160 Bilateral primary osteoarthritis of hip: Secondary | ICD-10-CM

## 2019-05-17 DIAGNOSIS — E785 Hyperlipidemia, unspecified: Secondary | ICD-10-CM

## 2019-05-17 DIAGNOSIS — Z131 Encounter for screening for diabetes mellitus: Secondary | ICD-10-CM

## 2019-05-17 DIAGNOSIS — Z0000 Encounter for general adult medical examination without abnormal findings: Secondary | ICD-10-CM

## 2019-05-17 DIAGNOSIS — M47816 Spondylosis without myelopathy or radiculopathy, lumbar region: Secondary | ICD-10-CM

## 2019-05-17 DIAGNOSIS — I10 Essential (primary) hypertension: Secondary | ICD-10-CM

## 2019-05-17 DIAGNOSIS — R7989 Other specified abnormal findings of blood chemistry: Secondary | ICD-10-CM

## 2019-05-17 DIAGNOSIS — L918 Other hypertrophic disorders of the skin: Secondary | ICD-10-CM

## 2019-05-17 DIAGNOSIS — R202 Paresthesia of skin: Secondary | ICD-10-CM | POA: Insufficient documentation

## 2019-05-17 DIAGNOSIS — N529 Male erectile dysfunction, unspecified: Secondary | ICD-10-CM

## 2019-05-17 DIAGNOSIS — D369 Benign neoplasm, unspecified site: Secondary | ICD-10-CM

## 2019-05-17 NOTE — Progress Notes (Signed)
Subjective:   HPI  Wesley Lawson is a 63 y.o. male who presents for Chief Complaint  Patient presents with  . Annual Exam    Patient Care Team: Kimberly Coye, Leward Quan as PCP - General (Family Medicine) Sees dentist Sees eye doctor [facility] Dr. Wilfrid Lund, GI Dr. Magnus Sinning, ortho  Concerns: Ongoing decreased ROM and some pain of both hips.   Sees ortho, has had injections for hip pain  Recently had some numb sensation in right lower medial leg, no injury, no trauma   None today  Reviewed their medical, surgical, family, social, medication, and allergy history and updated chart as appropriate.  Past Medical History:  Diagnosis Date  . Abnormal EKG   . Allergy   . Arthritis    hands, knees  . Chest pain    a. normal echo stress test in 2010 with mild to moderate LVH.   Marland Kitchen Dyslipidemia   . GERD (gastroesophageal reflux disease)   . History of exercise stress test 08/2016   normal  . Hyperlipidemia   . Hypertension     Past Surgical History:  Procedure Laterality Date  . COLONOSCOPY  07/21/2017   tubular adenoma, Dr. Wilfrid Lund  . WISDOM TOOTH EXTRACTION      Social History   Socioeconomic History  . Marital status: Married    Spouse name: Not on file  . Number of children: Not on file  . Years of education: Not on file  . Highest education level: Not on file  Occupational History  . Not on file  Social Needs  . Financial resource strain: Not on file  . Food insecurity    Worry: Not on file    Inability: Not on file  . Transportation needs    Medical: Not on file    Non-medical: Not on file  Tobacco Use  . Smoking status: Never Smoker  . Smokeless tobacco: Never Used  Substance and Sexual Activity  . Alcohol use: Yes    Alcohol/week: 3.0 standard drinks    Types: 3 Cans of beer per week  . Drug use: No  . Sexual activity: Yes  Lifestyle  . Physical activity    Days per week: Not on file    Minutes per session: Not on file   . Stress: Not on file  Relationships  . Social Herbalist on phone: Not on file    Gets together: Not on file    Attends religious service: Not on file    Active member of club or organization: Not on file    Attends meetings of clubs or organizations: Not on file    Relationship status: Not on file  . Intimate partner violence    Fear of current or ex partner: Not on file    Emotionally abused: Not on file    Physically abused: Not on file    Forced sexual activity: Not on file  Other Topics Concern  . Not on file  Social History Narrative   Married, 2 child April and [place], part time [place], full time at [employer].  Exercise - on the job with activity.   05/2017    Family History  Problem Relation Age of Onset  . Diabetes Mother   . Stroke Mother   . Diabetes Sister   . Asthma Daughter   . Colon polyps Neg Hx   . Esophageal cancer Neg Hx   . Rectal cancer Neg Hx   .  Stomach cancer Neg Hx   . Cancer Neg Hx   . Heart disease Neg Hx   . Colon cancer Neg Hx   . Liver cancer Neg Hx   . Pancreatic cancer Neg Hx      Current Outpatient Medications:  .  acetaminophen (TYLENOL) 325 MG tablet, Take 650 mg by mouth every 6 (six) hours as needed., Disp: , Rfl:  .  aspirin EC 81 MG tablet, Take 1 tablet (81 mg total) by mouth daily., Disp: 90 tablet, Rfl: 3 .  diclofenac (VOLTAREN) 75 MG EC tablet, Take with food. Use only two weeks at a time., Disp: 60 tablet, Rfl: 0 .  fluticasone (FLONASE) 50 MCG/ACT nasal spray, Place 1 spray into both nostrils daily., Disp: 16 g, Rfl: 2 .  ibuprofen (ADVIL,MOTRIN) 800 MG tablet, Take 1 tablet (800 mg total) by mouth every 8 (eight) hours as needed., Disp: 30 tablet, Rfl: 0 .  lisinopril-hydrochlorothiazide (ZESTORETIC) 20-25 MG tablet, Take 1 tablet by mouth daily., Disp: 90 tablet, Rfl: 0 .  pravastatin (PRAVACHOL) 20 MG tablet, Take 1 tablet (20 mg total) by mouth every evening., Disp: 90 tablet, Rfl: 0 .  sildenafil (REVATIO) 20  MG tablet, 1-5 tablets (20 mg to 100 mg) prior to sexual activity daily prn, Disp: 50 tablet, Rfl: 2 .  clomiPHENE (CLOMID) 50 MG tablet, Take 50 mg by mouth 2 (two) times a week., Disp: , Rfl:  .  doxycycline (VIBRA-TABS) 100 MG tablet, Take 1 tablet (100 mg total) by mouth 2 (two) times daily. (Patient not taking: Reported on 05/17/2019), Disp: 28 tablet, Rfl: 0 .  valACYclovir (VALTREX) 1000 MG tablet, TAKE 2 TABLETS BY MOUTH TWICE DAILY FOR 1 DAY FOR COLD SORE OR THREE TIMES DAILY FOR 1 WEEK FOR SHINGLES (Patient not taking: Reported on 05/17/2019), Disp: 30 tablet, Rfl: 0  Current Facility-Administered Medications:  .  0.9 %  sodium chloride infusion, 500 mL, Intravenous, Once, Danis, Estill Cotta III, MD  No Known Allergies     Review of Systems Constitutional: -fever, -chills, -sweats, -unexpected weight change, -decreased appetite, -fatigue Allergy: -sneezing, -itching, -congestion Dermatology: -changing moles, --rash, -lumps ENT: -runny nose, -ear pain, -sore throat, -hoarseness, -sinus pain, -teeth pain, - ringing in ears, -hearing loss, -nosebleeds Cardiology: -chest pain, -palpitations, -swelling, -difficulty breathing when lying flat, -waking up short of breath Respiratory: -cough, -shortness of breath, -difficulty breathing with exercise or exertion, -wheezing, -coughing up blood Gastroenterology: -abdominal pain, -nausea, -vomiting, -diarrhea, -constipation, -blood in stool, -changes in bowel movement, -difficulty swallowing or eating Hematology: -bleeding, -bruising  Musculoskeletal: -joint aches, -muscle aches, -joint swelling, -back pain, -neck pain, -cramping, -changes in gait Ophthalmology: denies vision changes, eye redness, itching, discharge Urology: -burning with urination, -difficulty urinating, -blood in urine, -urinary frequency, -urgency, -incontinence Neurology: -headache, -weakness, -tingling, -numbness, -memory loss, -falls, -dizziness Psychology: -depressed mood,  -agitation, -sleep problems Male GU: no testicular mass, pain, no lymph nodes swollen, no swelling, no rash.     Objective:  BP 122/78   Pulse 81   Temp 98.7 F (37.1 C)   Ht 5\' 10"  (1.778 m)   Wt 246 lb 9.6 oz (111.9 kg)   SpO2 96%   BMI 35.38 kg/m   General appearance: alert, no distress, WD/WN, African American male Skin: no worrisome lesions HEENT: normocephalic, conjunctiva/corneas normal, sclerae anicteric, PERRLA, EOMi, nares patent, no discharge or erythema, pharynx normal Oral cavity: MMM, tongue normal, teeth normal Neck: supple, no lymphadenopathy, no thyromegaly, no masses, normal ROM, no bruits Chest:  non tender, normal shape and expansion Heart: RRR, normal S1, S2, no murmurs Lungs: CTA bilaterally, no wheezes, rhonchi, or rales Abdomen: +bs, soft, non tender, non distended, no masses, no hepatomegaly, no splenomegaly, no bruits Back: non tender, normal ROM, no scoliosis Musculoskeletal: quite reduced hip ROM bilat, R worse than left, otherwise upper extremities non tender, no obvious deformity, normal ROM throughout, lower extremities non tender, no obvious deformity, normal ROM throughout Extremities: no edema, no cyanosis, no clubbing Pulses: 2+ symmetric, upper and lower extremities, normal cap refill Neurological: alert, oriented x 3, CN2-12 intact, strength normal upper extremities and lower extremities, sensation normal throughout, DTRs 2+ throughout, no cerebellar signs, gait normal Psychiatric: normal affect, behavior normal, pleasant  GU: normal male external genitalia, nontender, no masses, no hernia, no lymphadenopathy Rectal: deferred to urology  Assessment and Plan :   Encounter Diagnoses  Name Primary?  . Encounter for health maintenance examination in adult Yes  . Need for pneumococcal vaccination   . Essential hypertension   . Spondylosis of lumbar spine   . Osteoarthritis of both hips, unspecified osteoarthritis type   . Skin tag   . Low  testosterone   . Tubular adenoma   . Erectile dysfunction, unspecified erectile dysfunction type   . Hyperlipidemia, unspecified hyperlipidemia type   . Obesity with serious comorbidity, unspecified classification, unspecified obesity type   . Screening for diabetes mellitus     Physical exam - discussed and counseled on healthy lifestyle, diet, exercise, preventative care, vaccinations, sick and well care, proper use of emergency dept and after hours care, and addressed their concerns.    Health screening: See your eye doctor yearly for routine vision care. See your dentist yearly for routine dental care including hygiene visits twice yearly.  Cancer screening Colonoscopy:  Reviewed colonoscopy on file that is up to date  Discussed PSA, prostate exam, and prostate cancer screening risks/benefits.   PSA monitored by urology   Vaccinations: He is up to date on Td and flu vaccine  Counseled on the pneumococcal vaccine.  Vaccine information sheet given.  Pneumococcal vaccine PPSV23 given after consent obtained.  Shingles vaccine - plan a booster in 4 years given outbreak he had last year.  He likely has antibodies at this time.  Separate significant chronic issues discussed: reviewed 2018 normal stress tests of heart  obesity - work on lifestyle changes to lose weight  OA of hips - sees ortho, consider recheck, consider repeat xray given decreased ROM and pain  Paresthesia right lower leg, L4 pattern - possible radiculopathy L4, reviewed 2017 lumbar spine xray    Danyael was seen today for annual exam.  Diagnoses and all orders for this visit:  Encounter for health maintenance examination in adult -     Comprehensive metabolic panel -     CBC with Differential -     Lipid Panel -     Hemoglobin A1c  Need for pneumococcal vaccination -     Pneumococcal polysaccharide vaccine 23-valent greater than or equal to 2yo subcutaneous/IM  Essential hypertension  Spondylosis of  lumbar spine  Osteoarthritis of both hips, unspecified osteoarthritis type  Skin tag  Low testosterone  Tubular adenoma  Erectile dysfunction, unspecified erectile dysfunction type  Hyperlipidemia, unspecified hyperlipidemia type -     Comprehensive metabolic panel -     Lipid Panel  Obesity with serious comorbidity, unspecified classification, unspecified obesity type  Screening for diabetes mellitus -     Hemoglobin A1c    Follow-up  pending labs, yearly for physical

## 2019-05-18 ENCOUNTER — Other Ambulatory Visit: Payer: Self-pay | Admitting: Medical

## 2019-05-18 LAB — COMPREHENSIVE METABOLIC PANEL
ALT: 19 IU/L (ref 0–44)
AST: 18 IU/L (ref 0–40)
Albumin/Globulin Ratio: 1.8 (ref 1.2–2.2)
Albumin: 4.6 g/dL (ref 3.8–4.8)
Alkaline Phosphatase: 75 IU/L (ref 39–117)
BUN/Creatinine Ratio: 10 (ref 10–24)
BUN: 13 mg/dL (ref 8–27)
Bilirubin Total: 1.3 mg/dL — ABNORMAL HIGH (ref 0.0–1.2)
CO2: 25 mmol/L (ref 20–29)
Calcium: 10 mg/dL (ref 8.6–10.2)
Chloride: 106 mmol/L (ref 96–106)
Creatinine, Ser: 1.35 mg/dL — ABNORMAL HIGH (ref 0.76–1.27)
GFR calc Af Amer: 64 mL/min/{1.73_m2} (ref >59)
GFR calc non Af Amer: 55 mL/min/{1.73_m2} — ABNORMAL LOW (ref >59)
Globulin, Total: 2.6 g/dL (ref 1.5–4.5)
Glucose: 85 mg/dL (ref 65–99)
Potassium: 4.7 mmol/L (ref 3.5–5.2)
Sodium: 144 mmol/L (ref 134–144)
Total Protein: 7.2 g/dL (ref 6.0–8.5)

## 2019-05-18 LAB — CBC WITH DIFFERENTIAL/PLATELET
Basophils Absolute: 0 10*3/uL (ref 0.0–0.2)
Basos: 0 %
EOS (ABSOLUTE): 0.2 10*3/uL (ref 0.0–0.4)
Eos: 4 %
Hematocrit: 41.8 % (ref 37.5–51.0)
Hemoglobin: 14 g/dL (ref 13.0–17.7)
Immature Grans (Abs): 0 10*3/uL (ref 0.0–0.1)
Immature Granulocytes: 0 %
Lymphocytes Absolute: 1.6 10*3/uL (ref 0.7–3.1)
Lymphs: 36 %
MCH: 29 pg (ref 26.6–33.0)
MCHC: 33.5 g/dL (ref 31.5–35.7)
MCV: 87 fL (ref 79–97)
Monocytes Absolute: 0.5 10*3/uL (ref 0.1–0.9)
Monocytes: 10 %
Neutrophils Absolute: 2.2 10*3/uL (ref 1.4–7.0)
Neutrophils: 50 %
Platelets: 251 10*3/uL (ref 150–450)
RBC: 4.82 x10E6/uL (ref 4.14–5.80)
RDW: 13.9 % (ref 11.6–15.4)
WBC: 4.5 10*3/uL (ref 3.4–10.8)

## 2019-05-18 LAB — LIPID PANEL
Chol/HDL Ratio: 5.8 ratio — ABNORMAL HIGH (ref 0.0–5.0)
Cholesterol, Total: 220 mg/dL — ABNORMAL HIGH (ref 100–199)
HDL: 38 mg/dL — ABNORMAL LOW (ref >39)
LDL Chol Calc (NIH): 151 mg/dL — ABNORMAL HIGH (ref 0–99)
Triglycerides: 170 mg/dL — ABNORMAL HIGH (ref 0–149)
VLDL Cholesterol Cal: 31 mg/dL (ref 5–40)

## 2019-05-18 LAB — HEMOGLOBIN A1C
Est. average glucose Bld gHb Est-mCnc: 114 mg/dL
Hgb A1c MFr Bld: 5.6 % (ref 4.8–5.6)

## 2019-05-28 ENCOUNTER — Telehealth: Payer: Self-pay | Admitting: Medical

## 2019-05-28 NOTE — Telephone Encounter (Signed)
Requested records received from [facility]

## 2019-05-29 ENCOUNTER — Encounter: Payer: Self-pay | Admitting: Medical

## 2019-06-28 ENCOUNTER — Telehealth: Payer: Self-pay | Admitting: Physical Medicine and Rehabilitation

## 2019-06-28 ENCOUNTER — Other Ambulatory Visit: Payer: Self-pay | Admitting: Medical

## 2019-06-28 ENCOUNTER — Telehealth: Payer: Self-pay | Admitting: Medical

## 2019-06-28 MED ORDER — DOXYCYCLINE HYCLATE 100 MG PO TABS: 100.0000 mg | ORAL_TABLET | Freq: Two times a day (BID) | ORAL | 0 refills | Status: DC

## 2019-06-28 NOTE — Telephone Encounter (Signed)
Pt called and is requesting a refill on a antibtioc for his prostate state he was taking cipro but states that the doxycycline works better for the pain, pt wants to know if he needs to come in or can he just get a refill pt uses [facility] [place], [place] AT [address]

## 2019-06-28 NOTE — Telephone Encounter (Signed)
Doxycycline sent.  Please pull the urology records received as they don't appear to be scanned in. I would like to review them

## 2019-06-29 NOTE — Telephone Encounter (Signed)
Scheduled for an OV with Dr. Ninfa Linden.

## 2019-06-29 NOTE — Telephone Encounter (Signed)
F/up Dr. Ninfa Linden

## 2019-07-06 ENCOUNTER — Ambulatory Visit (INDEPENDENT_AMBULATORY_CARE_PROVIDER_SITE_OTHER): Payer: BC Managed Care – PPO | Admitting: Medical

## 2019-07-06 ENCOUNTER — Emergency Department (HOSPITAL_COMMUNITY): Payer: BC Managed Care – PPO

## 2019-07-06 ENCOUNTER — Other Ambulatory Visit: Payer: Self-pay

## 2019-07-06 ENCOUNTER — Emergency Department (HOSPITAL_COMMUNITY)
Admission: EM | Admit: 2019-07-06 | Discharge: 2019-07-06 | Disposition: A | Discharge status: Home / Self Care | Payer: BC Managed Care – PPO | Attending: Emergency Medicine | Admitting: Emergency Medicine

## 2019-07-06 ENCOUNTER — Encounter (HOSPITAL_COMMUNITY): Payer: Self-pay | Admitting: Obstetrics and Gynecology

## 2019-07-06 VITALS — BP 142/90 | HR 97 | Temp 99.1°F | Ht 70.0 in | Wt 233.8 lb

## 2019-07-06 DIAGNOSIS — Z7982 Long term (current) use of aspirin: Secondary | ICD-10-CM | POA: Diagnosis not present

## 2019-07-06 DIAGNOSIS — R509 Fever, unspecified: Secondary | ICD-10-CM | POA: Diagnosis not present

## 2019-07-06 DIAGNOSIS — Z79899 Other long term (current) drug therapy: Secondary | ICD-10-CM | POA: Diagnosis not present

## 2019-07-06 DIAGNOSIS — E860 Dehydration: Secondary | ICD-10-CM | POA: Diagnosis not present

## 2019-07-06 DIAGNOSIS — R0602 Shortness of breath: Secondary | ICD-10-CM | POA: Diagnosis present

## 2019-07-06 DIAGNOSIS — I10 Essential (primary) hypertension: Secondary | ICD-10-CM

## 2019-07-06 DIAGNOSIS — E669 Obesity, unspecified: Secondary | ICD-10-CM

## 2019-07-06 DIAGNOSIS — U071 COVID-19: Secondary | ICD-10-CM | POA: Diagnosis not present

## 2019-07-06 DIAGNOSIS — R69 Illness, unspecified: Secondary | ICD-10-CM

## 2019-07-06 LAB — COMPREHENSIVE METABOLIC PANEL
ALT: 30 U/L (ref 0–44)
AST: 27 U/L (ref 15–41)
Albumin: 4.1 g/dL (ref 3.5–5.0)
Alkaline Phosphatase: 61 U/L (ref 38–126)
Anion gap: 14 (ref 5–15)
BUN: 19 mg/dL (ref 8–23)
CO2: 28 mmol/L (ref 22–32)
Calcium: 9.1 mg/dL (ref 8.9–10.3)
Chloride: 95 mmol/L — ABNORMAL LOW (ref 98–111)
Creatinine, Ser: 1.38 mg/dL — ABNORMAL HIGH (ref 0.61–1.24)
GFR calc Af Amer: >60 mL/min (ref >60)
GFR calc non Af Amer: 54 mL/min — ABNORMAL LOW (ref >60)
Glucose, Bld: 108 mg/dL — ABNORMAL HIGH (ref 70–99)
Potassium: 3.1 mmol/L — ABNORMAL LOW (ref 3.5–5.1)
Sodium: 137 mmol/L (ref 135–145)
Total Bilirubin: 1 mg/dL (ref 0.3–1.2)
Total Protein: 8.2 g/dL — ABNORMAL HIGH (ref 6.5–8.1)

## 2019-07-06 LAB — CBC WITH DIFFERENTIAL/PLATELET
Abs Immature Granulocytes: 0.04 10*3/uL (ref 0.00–0.07)
Basophils Absolute: 0 10*3/uL (ref 0.0–0.1)
Basophils Relative: 0 %
Eosinophils Absolute: 0.1 10*3/uL (ref 0.0–0.5)
Eosinophils Relative: 1 %
HCT: 50.7 % (ref 39.0–52.0)
Hemoglobin: 16.3 g/dL (ref 13.0–17.0)
Immature Granulocytes: 1 %
Lymphocytes Relative: 18 %
Lymphs Abs: 1 10*3/uL (ref 0.7–4.0)
MCH: 28.6 pg (ref 26.0–34.0)
MCHC: 32.1 g/dL (ref 30.0–36.0)
MCV: 88.9 fL (ref 80.0–100.0)
Monocytes Absolute: 0.6 10*3/uL (ref 0.1–1.0)
Monocytes Relative: 11 %
Neutro Abs: 3.9 10*3/uL (ref 1.7–7.7)
Neutrophils Relative %: 69 %
Platelets: 209 10*3/uL (ref 150–400)
RBC: 5.7 MIL/uL (ref 4.22–5.81)
RDW: 14.2 % (ref 11.5–15.5)
WBC: 5.7 10*3/uL (ref 4.0–10.5)
nRBC: 0 % (ref 0.0–0.2)

## 2019-07-06 MED ORDER — IVERMECTIN 3 MG PO TABS: 200.0000 ug/kg | ORAL_TABLET | Freq: Once | ORAL | 0 refills | Status: AC

## 2019-07-06 MED ORDER — IVERMECTIN 3 MG PO TABS
18.0000 mg | ORAL_TABLET | Freq: Once | ORAL | Status: AC
  Administered 2019-07-06: 18 mg via ORAL
  Filled 2019-07-06: qty 1
  Filled 2019-07-06: qty 6

## 2019-07-06 MED ORDER — POTASSIUM CHLORIDE CRYS ER 20 MEQ PO TBCR
40.0000 meq | EXTENDED_RELEASE_TABLET | Freq: Once | ORAL | Status: AC
  Administered 2019-07-06: 40 meq via ORAL
  Filled 2019-07-06: qty 2

## 2019-07-06 MED ORDER — LACTATED RINGERS IV BOLUS
1000.0000 mL | Freq: Once | INTRAVENOUS | Status: AC
  Administered 2019-07-06: 1000 mL via INTRAVENOUS

## 2019-07-06 MED ORDER — ACETAMINOPHEN 500 MG PO TABS
1000.0000 mg | ORAL_TABLET | Freq: Once | ORAL | Status: AC
  Administered 2019-07-06: 1000 mg via ORAL
  Filled 2019-07-06: qty 2

## 2019-07-06 MED ORDER — IBUPROFEN 200 MG PO TABS
600.0000 mg | ORAL_TABLET | Freq: Once | ORAL | Status: AC
  Administered 2019-07-06: 600 mg via ORAL
  Filled 2019-07-06: qty 3

## 2019-07-06 NOTE — ED Provider Notes (Signed)
[facility] DEPT Provider Note   CSN: [id number] Arrival date & time: 07/06/19  1838     History Chief Complaint  Patient presents with  . Shortness of Breath    Wesley Lawson is a 64 y.o. male.  HPI   63yM with fatigue and shortness of breath. Has to be tested to go back to work. Had positive test on 06/29/19. Developed symptoms 07/02/19. Now with increasing fatigue and dyspnea. Occasional nonproductive cough. Little appetite. Nausea. No vomiting. No fever.    Past Medical History:  Diagnosis Date  . Abnormal EKG   . Allergy   . Arthritis    hands, knees  . Chest pain    a. normal echo stress test in 2010 with mild to moderate LVH.   Marland Kitchen Dyslipidemia   . GERD (gastroesophageal reflux disease)   . History of exercise stress test 08/2016   normal  . Hyperlipidemia   . Hypertension     Patient Active Problem List   Diagnosis Date Noted  . COVID-19 07/06/2019  . SOB (shortness of breath) 07/06/2019  . Fever 07/06/2019  . Dehydration 07/06/2019  . Need for pneumococcal vaccination 05/17/2019  . Paresthesia 05/17/2019  . Benign prostatic hyperplasia with lower urinary tract symptoms 05/15/2019  . Dysuria 05/15/2019  . Low testosterone 05/15/2019  . Decreased hearing of left ear 04/11/2019  . Impacted cerumen of left ear 04/11/2019  . Skin tag 04/11/2019  . Lipoma 04/11/2019  . Tubular adenoma 11/02/2017  . Screen for colon cancer 06/13/2017  . Screening for prostate cancer 06/13/2017  . Screening for diabetes mellitus 06/13/2017  . Muscle tightness 06/13/2017  . Herpes zoster without complication 123456  . Cold sore 10/21/2016  . Hyperlipidemia 07/30/2016  . Erectile dysfunction 07/30/2016  . Leg pain, anterior, unspecified laterality 07/30/2016  . 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 Osteoarthritis of both hips 07/30/2016  . Abnormal EKG 07/30/2016  . Right-sided low back pain without sciatica 10/13/2015  . Obesity  10/13/2015  . Essential hypertension 10/13/2015  . Decreased range of hip movement 10/13/2015  . Right hip pain 02/21/2014  . Numbness of left hand 02/07/2014  . Pain of right thumb 02/07/2014    Past Surgical History:  Procedure Laterality Date  . COLONOSCOPY  07/21/2017   tubular adenoma, Dr. Wilfrid Lund  . WISDOM TOOTH EXTRACTION        Family History  Problem Relation Age of Onset  . Diabetes Mother   . Stroke Mother   . Diabetes Sister   . Asthma Daughter   . Colon polyps Neg Hx   . Esophageal cancer Neg Hx   . Rectal cancer Neg Hx   . Stomach cancer Neg Hx   . Cancer Neg Hx   . Heart disease Neg Hx   . Colon cancer Neg Hx   . Liver cancer Neg Hx   . Pancreatic cancer Neg Hx    Social History   Tobacco Use  . Smoking status: Never Smoker  . Smokeless tobacco: Never Used  Substance Use Topics  . Alcohol use: Yes    Alcohol/week: 3.0 standard drinks    Types: 3 Cans of beer per week  . Drug use: No   Home Medications Prior to Admission medications   Medication Sig Start Date End Date Taking? Authorizing Provider  acetaminophen (TYLENOL) 325 MG tablet Take 650 mg by mouth every 6 (six) hours as needed.    [provider]  aspirin EC 81 MG  tablet Take 1 tablet (81 mg total) by mouth daily. 05/14/19   Tysinger, Camelia Eng, PA-C  doxycycline (VIBRA-TABS) 100 MG tablet Take 1 tablet (100 mg total) by mouth 2 (two) times daily. 06/28/19   Tysinger, Camelia Eng, PA-C  fluticasone (FLONASE) 50 MCG/ACT nasal spray Place 1 spray into both nostrils daily. 07/27/18   Tysinger, Camelia Eng, PA-C  ibuprofen (ADVIL,MOTRIN) 800 MG tablet Take 1 tablet (800 mg total) by mouth every 8 (eight) hours as needed. 10/13/16   Tysinger, Camelia Eng, PA-C  lisinopril-hydrochlorothiazide (ZESTORETIC) 20-25 MG tablet Take 1 tablet by mouth daily. 05/14/19   Tysinger, Camelia Eng, PA-C  pravastatin (PRAVACHOL) 20 MG tablet Take 1 tablet (20 mg total) by mouth every evening. 04/25/19 04/24/20  Rita Ohara,  MD  sildenafil (REVATIO) 20 MG tablet 1-5 tablets (20 mg to 100 mg) prior to sexual activity daily prn 09/29/16   Tysinger, Camelia Eng, PA-C  valACYclovir (VALTREX) 1000 MG tablet TAKE 2 TABLETS BY MOUTH TWICE DAILY FOR 1 DAY FOR COLD SORE OR THREE TIMES DAILY FOR 1 WEEK FOR SHINGLES 12/29/17   Tysinger, Camelia Eng, PA-C    Allergies    Patient has no known allergies.  Review of Systems   Review of Systems All systems reviewed and negative, other than as noted in HPI.  Physical Exam Updated Vital Signs BP 130/79 (BP Location: Right Arm)   Pulse (!) 106   Temp (!) 102.6 F (39.2 C) (Oral)   Resp (!) 35   SpO2 97%   Physical Exam Vitals and nursing note reviewed.  Constitutional:      General: He is not in acute distress.    Appearance: He is well-developed. He is obese.  HENT:     Head: Normocephalic and atraumatic.  Eyes:     General:        Right eye: No discharge.        Left eye: No discharge.     Conjunctiva/sclera: Conjunctivae normal.  Cardiovascular:     Rate and Rhythm: Normal rate and regular rhythm.     Heart sounds: Normal heart sounds. No murmur. No friction rub. No gallop.   Pulmonary:     Effort: No respiratory distress.     Breath sounds: Normal breath sounds.     Comments: Mild tachypnea.  Can speak in complete sentences without apparent difficulty.  Lungs clear bilaterally. Abdominal:     General: There is no distension.     Palpations: Abdomen is soft.     Tenderness: There is no abdominal tenderness.  Musculoskeletal:        General: No tenderness.     Cervical back: Neck supple.  Skin:    General: Skin is warm and dry.  Neurological:     Mental Status: He is alert.  Psychiatric:        Behavior: Behavior normal.        Thought Content: Thought content normal.     ED Results / Procedures / Treatments   Labs (all labs ordered are listed, but only abnormal results are displayed) Labs Reviewed  COMPREHENSIVE METABOLIC PANEL - Abnormal; Notable for  the following components:      Result Value   Potassium 3.1 (*)    Chloride 95 (*)    Glucose, Bld 108 (*)    Creatinine, Ser 1.38 (*)    Total Protein 8.2 (*)    GFR calc non Af Amer 54 (*)    All other components within normal limits  CBC WITH DIFFERENTIAL/PLATELET    EKG EKG Interpretation  Date/Time:  Friday July 06 2019 18:55:17 EST Ventricular Rate:  109 PR Interval:    QRS Duration: 87 QT Interval:  360 QTC Calculation: 465 R Axis:   52 Text Interpretation: Sinus tachycardia Atrial premature complexes Probable left atrial enlargement Borderline repolarization abnormality Confirmed by Virgel Manifold [phone]) on 07/06/2019 7:08:21 PM   Radiology DG Chest Portable 1 View  Result Date: 07/06/2019 CLINICAL DATA:  Patient tested positive for COVID on Friday. Patient reports SOB when he coughs. PT HX: HTN, ex smoker EXAM: PORTABLE CHEST 1 VIEW COMPARISON:  06/05/2015 FINDINGS: Bilateral interstitial and hazy airspace lung opacities are noted in the mid and lower lungs, new from the prior exam, consistent with multifocal COVID-19 pneumonia. No pleural effusion or pneumothorax. Cardiac silhouette is normal in size. No mediastinal or hilar masses. Skeletal structures are grossly intact. IMPRESSION: 1. Bilateral mid and lower lung zone interstitial and hazy airspace lung opacities consistent with multifocal COVID-19 pneumonia. Electronically Signed   By: Lajean Manes M.D.   On: 07/06/2019 20:13    Procedures Procedures (including critical care time)  Medications Ordered in ED Medications  lactated ringers bolus 1,000 mL (has no administration in time range)  ibuprofen (ADVIL) tablet 600 mg (has no administration in time range)  acetaminophen (TYLENOL) tablet 1,000 mg (has no administration in time range)  ivermectin (STROMECTOL) tablet 18 mg (has no administration in time range)    ED Course  I have reviewed the triage vital signs and the nursing notes.  Pertinent labs &  imaging results that were available during my care of the patient were reviewed by me and considered in my medical decision making (see chart for details).    MDM Rules/Calculators/A&P                      61yM with COVID. Moderate symptoms. Doesn't have oxygen requirement. Discussed with pt treatment options. Discussed that NIH recently dropped recommendations against ivermectin and now doesn't recommend for or against it's use for COVID, essentially leaving decision to physicians and patients. He would like to try it. I think I would myself. It's medication that been out for a long time and has a good safety profile. The downside seems fairly minimal.  After some fluids and antipyretics he is feeling significantly better.  At this point I feel he is fine for continued outpatient management.  Return precautions were discussed.  Wesley Lawson was evaluated in Emergency Department on 07/11/2019 for the symptoms described in the history of present illness. He was evaluated in the context of the global COVID-19 pandemic, which necessitated consideration that the patient might be at risk for infection with the SARS-CoV-2 virus that causes COVID-19. Institutional protocols and algorithms that pertain to the evaluation of patients at risk for COVID-19 are in a state of rapid change based on information released by regulatory bodies including the CDC and federal and state organizations. These policies and algorithms were followed during the patient's care in the ED.   Final Clinical Impression(s) / ED Diagnoses Final diagnoses:  COVID-19 virus infection    Rx / DC Orders ED Discharge Orders    None       Virgel Manifold, MD 07/11/19 [phone]

## 2019-07-06 NOTE — ED Triage Notes (Signed)
Patient is coming from PCP office: Patient tested positive for COVID on Friday. Patient reports SOB when he coughs. Clayton sent him to get an IV and fluids.

## 2019-07-06 NOTE — Discharge Instructions (Addendum)
You had a dose of ivermection in the ER today. You need to take your second dose on 07/08/19. Continue to quarantine. If you feeling like your symptoms are significantly worsening, particularly your breathing, then come back to the ER.

## 2019-07-06 NOTE — Progress Notes (Signed)
[facility]   Subjective:  Wesley Lawson is a 64 y.o. male who presents for respiratory illness.    PCP: Wesley Hurl, PA-C  Here for illness.  Started feeling bad  5 days ago, has worsened.  Feels like something hung in chest.    Has felt lightheaded.   Feels weak.  Got tested at [facility] 06/29/2019.  Rapid test negative, but the other test came back +.    Symptoms include shortness of breath, cough, nausea, no vomiting, some loose stool.  No sore throat.  +head congestion, headache.  Tingling in hands.   +subjective fever, +Body aches, chills.   Trying to drink a lot of water, but probably not enough.   Urine is not clear.   No wheezing.   Having trouble breathing.  Using Tylenol.  He is not a smoker.  No other aggravating or relieving factors.  No other c/o.   Past Medical History:  Diagnosis Date  . Abnormal EKG   . Allergy   . Arthritis    hands, knees  . Chest pain    a. normal echo stress test in 2010 with mild to moderate LVH.   Marland Kitchen Dyslipidemia   . GERD (gastroesophageal reflux disease)   . History of exercise stress test 08/2016   normal  . Hyperlipidemia   . Hypertension     ROS as in subjective   Objective: BP (!) 142/90   Pulse 97   Temp 99.1 F (37.3 C)   Ht 5\' 10"  (1.778 m)   Wt 233 lb 12.8 oz (106.1 kg)   SpO2 93%   BMI 33.55 kg/m   Wt Readings from Last 3 Encounters:  07/06/19 233 lb 12.8 oz (106.1 kg)  05/17/19 246 lb 9.6 oz (111.9 kg)  05/14/19 249 lb 12.8 oz (113.3 kg)    General appearance: Alert, WD/WN, no distress, ill appearing                             Skin: hot, no rash                           Head: + sinus tenderness                            Eyes: conjunctiva injected bilat, corneas clear, PERRLA                           Nose: septum midline, turbinates swollen, with erythema              Mouth/throat: dry MM, tongue normal, mild pharyngeal erythema                           Neck: supple, no  adenopathy, no thyromegaly, non tender                          Heart: RRR, normal S1, S2, no murmurs                         Lungs: decreased lung sounds in general, crackles faint in lower files      Assessment  Encounter Diagnoses  Name Primary?  . COVID-19 Yes  . SOB (  shortness of breath)   . Fever, unspecified fever cause   . Dehydration   . Comorbid condition   . Essential hypertension   . Obesity with serious comorbidity, unspecified classification, unspecified obesity type       Plan: I see Wesley Lawson at my regular office and he is ill appearing compared to his usual self.  He appears dehydrated, hypoxic compared to his norm, lungs decreased, weak.   He agreed to transport and to be evaluated in the emergency dept.  Recommend O2 support, CXR, IV fluids, basic labs for initial eval, + covid test about 7 days ago.  I spoke to his wife about the same, she is outside in the car.   She was made aware that we will call EMS.   Diagnoses and all orders for this visit:  COVID-19  SOB (shortness of breath)  Fever, unspecified fever cause  Dehydration  Comorbid condition  Essential hypertension  Obesity with serious comorbidity, unspecified classification, unspecified obesity type

## 2019-07-09 ENCOUNTER — Encounter: Payer: Self-pay | Admitting: Medical

## 2019-07-09 ENCOUNTER — Ambulatory Visit (INDEPENDENT_AMBULATORY_CARE_PROVIDER_SITE_OTHER): Payer: BC Managed Care – PPO | Admitting: Medical

## 2019-07-09 ENCOUNTER — Other Ambulatory Visit: Payer: Self-pay

## 2019-07-09 VITALS — Ht 70.0 in | Wt 233.0 lb

## 2019-07-09 DIAGNOSIS — R05 Cough: Secondary | ICD-10-CM | POA: Diagnosis not present

## 2019-07-09 DIAGNOSIS — R059 Cough, unspecified: Secondary | ICD-10-CM

## 2019-07-09 DIAGNOSIS — R0602 Shortness of breath: Secondary | ICD-10-CM

## 2019-07-09 DIAGNOSIS — U071 COVID-19: Secondary | ICD-10-CM | POA: Diagnosis not present

## 2019-07-09 DIAGNOSIS — E860 Dehydration: Secondary | ICD-10-CM | POA: Diagnosis not present

## 2019-07-09 MED ORDER — BENZONATATE 200 MG PO CAPS: 200.0000 mg | ORAL_CAPSULE | Freq: Three times a day (TID) | ORAL | 0 refills | Status: DC | PRN

## 2019-07-09 MED ORDER — EMERGEN-C IMMUNE PLUS PO PACK: 1.0000 | PACK | Freq: Two times a day (BID) | ORAL | 0 refills | Status: DC

## 2019-07-09 MED ORDER — ALBUTEROL SULFATE HFA 108 (90 BASE) MCG/ACT IN AERS: 2.0000 | INHALATION_SPRAY | Freq: Four times a day (QID) | RESPIRATORY_TRACT | 0 refills | Status: DC | PRN

## 2019-07-09 NOTE — Progress Notes (Signed)
done

## 2019-07-09 NOTE — Progress Notes (Signed)
Subjective:     Patient ID: Wesley Lawson, male   DOB: 02/12/1956, 64 y.o.   MRN: [id number]  This visit type was conducted due to national recommendations for restrictions regarding the COVID-19 Pandemic (e.g. social distancing) in an effort to limit this patient's exposure and mitigate transmission in our community.  Due to their co-morbid illnesses, this patient is at least at moderate risk for complications without adequate follow up.  This format is felt to be most appropriate for this patient at this time.    Documentation for virtual audio and video telecommunications through Zoom encounter:  The patient was located at home. The provider was located in the office. The patient did consent to this visit and is aware of possible charges through their insurance for this visit.  The other persons participating in this telemedicine service were none. Time spent on call was 20 minutes and in review of previous records 20 minutes total.  This virtual service is not related to other E/M service within previous 7 days.   HPI Chief Complaint  Patient presents with  . Follow-up    + covid 19    Virtual consult today for follow-up on Covid infection.  I saw him at the [facility] this past Friday night 3 days ago.  I ended up sending him to the emergency department due to dehydration, fatigue, weakness and pulse ox low at 93%.  He ended up getting IV fluids, 2 days of ivermectin treatment, and he feels 75% improved compared to last week, much improved today than this past week.   He still has a cough, still feels in general fatigued.  Otherwise no fever no body aches or chills, gradually improving. IV fluids, oral,   Review of Systems As in subjective    Objective:   Physical Exam Due to coronavirus pandemic stay at home measures, patient visit was virtual and they were not examined in person.   Ht 5\' 10"  (1.778 m)   Wt 233 lb (105.7 kg)   BMI 33.43 kg/m       Assessment:     Encounter Diagnoses  Name Primary?  . COVID-19 Yes  . Cough   . SOB (shortness of breath)   . Dehydration        Plan:     He is significantly improved compared to 3 days ago when he went to the emergency department based on our in person visit at the respiratory clinic, a separate clinic from here.  He will begin medications below to help with his current symptoms.  We discussed importance of hydration, rest, gradual return to activity over the next few weeks.   He knows to call back if any new symptoms or concerns.  He will continue his quarantine.   I will give him a work note out this week return to work on February 1 which will be adequate quarantine period.    [name] was seen today for follow-up.  Diagnoses and all orders for this visit:  COVID-19  Cough  SOB (shortness of breath)  Dehydration  Other orders -     benzonatate (TESSALON) 200 MG capsule; Take 1 capsule (200 mg total) by mouth 3 (three) times daily as needed for cough. -     albuterol (VENTOLIN HFA) 108 (90 Base) MCG/ACT inhaler; Inhale 2 puffs into the lungs every 6 (six) hours as needed for wheezing or shortness of breath. -     Multiple Vitamins-Minerals (EMERGEN-C IMMUNE PLUS) PACK; Take  1 tablet by mouth 2 (two) times daily.

## 2019-07-11 ENCOUNTER — Ambulatory Visit: Payer: BC Managed Care – PPO | Admitting: Orthopaedic Surgery

## 2019-07-23 ENCOUNTER — Encounter: Payer: Self-pay | Admitting: Physician Assistant

## 2019-07-23 ENCOUNTER — Ambulatory Visit (INDEPENDENT_AMBULATORY_CARE_PROVIDER_SITE_OTHER): Payer: BC Managed Care – PPO | Admitting: Physician Assistant

## 2019-07-23 ENCOUNTER — Ambulatory Visit: Payer: Self-pay

## 2019-07-23 ENCOUNTER — Other Ambulatory Visit: Payer: Self-pay

## 2019-07-23 DIAGNOSIS — M1611 Unilateral primary osteoarthritis, right hip: Secondary | ICD-10-CM | POA: Diagnosis not present

## 2019-07-23 DIAGNOSIS — M1612 Unilateral primary osteoarthritis, left hip: Secondary | ICD-10-CM | POA: Diagnosis not present

## 2019-07-23 NOTE — Progress Notes (Signed)
Office Visit Note   Patient: Wesley Lawson           Date of Birth: 1956/03/24           MRN: [id number] Visit Date: 07/23/2019              Requested by: Carlena Hurl, PA-C [address] [place],  [place] [address] PCP: Carlena Hurl, PA-C   Assessment & Plan: Visit Diagnoses:  1. Primary osteoarthritis of right hip   2. Primary osteoarthritis of left hip     Plan: We will send him for repeat injections both hips for her hip arthritis.  To follow-up with [place] in 6 months to discuss further treatment.  Did discuss with him at length today in total hip arthroplasty surgery.  Risk benefits including but not limited to vessel, PE/DVT, wound healing problems, prolonged pain and worsening pain discussed with patient at length.  Handout on total hip replacement was given.  Questions encouraged and answered at length.  Follow-Up Instructions: Return in about 6 months (around 01/20/2020).   Orders:  Orders Placed This Encounter  Procedures   XR HIP UNILAT W OR W/O PELVIS 2-3 VIEWS RIGHT   No orders of the defined types were placed in this encounter.     Procedures: No procedures performed   Clinical Data: No additional findings.   Subjective: Chief Complaint  Patient presents with   Right Hip - Pain    HPI Wesley Lawson 64 year old male well-known to our department service comes in today for right hip pain.  He has known arthritis of both hips.  Was last seen in June 2019.  Since that time his hips have became stiff especially his right hip.  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  Notes he has trouble bending.  Pain is dull achy.  Limits his overall mobility even donning shoes and socks.  Is taking ibuprofen for the pain.  Is undergone intra-articular injections in both hips in the past has had a good results with these.  He has had no known injury.  He is diabetic with good control last hemoglobin A1c was 5.6.  He was diagnosed with Covid in January and has  recovered from this.  Review of Systems Negative for fevers, chills, shortness of breath chest pain.  Please see HPI otherwise negative or noncontributory  Objective: Vital Signs: There were no vitals taken for this visit.  Physical Exam Constitutional:      Appearance: He is not ill-appearing or diaphoretic.  Pulmonary:     Effort: Pulmonary effort is normal.  Neurological:     Mental Status: He is alert and oriented to person, place, and time.  Psychiatric:        Mood and Affect: Mood normal.        Behavior: Behavior normal.     Ortho Exam Bilateral hips he has slight internal and external rotation of the left hip without significant pain.  Right hip no internal or external rotation.  Calves are supple nontender bilaterally.  Dorsiflexion plantarflexion bilateral ankles intact.  Ambulates without an assistive device with antalgic gait. Specialty Comments:  No specialty comments available.  Imaging: AP pelvis lateral view of the right hip shows degenerative joint disease of both hips.  Right hip is basically bone-on-bone.  Left hip with moderate joint space narrowing and osteophyte formation.  No acute fractures dislocations subluxations.  PMFS History: Patient Active Problem List   Diagnosis Date Noted  Primary osteoarthritis of right hip 07/23/2019   COVID-19 07/06/2019   SOB (shortness of breath) 07/06/2019   Fever 07/06/2019   Dehydration 07/06/2019   Need for pneumococcal vaccination 05/17/2019   Paresthesia 05/17/2019   Benign prostatic hyperplasia with lower urinary tract symptoms 05/15/2019   Dysuria 05/15/2019   Low testosterone 05/15/2019   Decreased hearing of left ear 04/11/2019   Impacted cerumen of left ear 04/11/2019   Skin tag 04/11/2019   Lipoma 04/11/2019   Tubular adenoma 11/02/2017   Screen for colon cancer 06/13/2017   Screening for prostate cancer 06/13/2017   Screening for diabetes mellitus 06/13/2017   Muscle tightness  06/13/2017   Herpes zoster without complication [id number]   Cold sore 10/21/2016   Hyperlipidemia 07/30/2016   Erectile dysfunction 07/30/2016   Leg pain, anterior, unspecified laterality 07/30/2016   Spondylosis of lumbar spine 07/30/2016   Primary osteoarthritis of left hip 07/30/2016   Abnormal EKG 07/30/2016   Right-sided low back pain without sciatica 10/13/2015   Obesity 10/13/2015   Essential hypertension 10/13/2015   Decreased range of hip movement 10/13/2015   Right hip pain 02/21/2014   Numbness of left hand 02/07/2014   Pain of right thumb 02/07/2014   Past Medical History:  Diagnosis Date   Abnormal EKG    Allergy    Arthritis    hands, knees   Chest pain    a. normal echo stress test in 2010 with mild to moderate LVH.    Dyslipidemia    GERD (gastroesophageal reflux disease)    History of exercise stress test 08/2016   normal   Hyperlipidemia    Hypertension     Family History  Problem Relation Age of Onset   Diabetes Mother    Stroke Mother    Diabetes Sister    Asthma Daughter    Colon polyps Neg Hx    Esophageal cancer Neg Hx    Rectal cancer Neg Hx    Stomach cancer Neg Hx    Cancer Neg Hx    Heart disease Neg Hx    Colon cancer Neg Hx    Liver cancer Neg Hx    Pancreatic cancer Neg Hx     Past Surgical History:  Procedure Laterality Date   COLONOSCOPY  07/21/2017   tubular adenoma, Dr. Wilfrid Lund   WISDOM TOOTH EXTRACTION     Social History   Occupational History   Not on file  Tobacco Use   Smoking status: Never Smoker   Smokeless tobacco: Never Used  Substance and Sexual Activity   Alcohol use: Yes    Alcohol/week: 3.0 standard drinks    Types: 3 Cans of beer per week   Drug use: No   Sexual activity: Yes

## 2019-07-23 NOTE — Addendum Note (Signed)
Addended by: Michae Kava B on: 07/23/2019 10:28 AM   Modules accepted: Orders

## 2019-07-31 ENCOUNTER — Telehealth: Payer: Self-pay | Admitting: Medical

## 2019-07-31 ENCOUNTER — Other Ambulatory Visit: Payer: Self-pay | Admitting: Medical

## 2019-07-31 MED ORDER — IBUPROFEN 800 MG PO TABS: 800.0000 mg | ORAL_TABLET | Freq: Three times a day (TID) | ORAL | 0 refills | Status: DC | PRN

## 2019-07-31 NOTE — Telephone Encounter (Signed)
Pt called and wanted to see if he could get a refill on the 800mg  ibuprofen for his arthritis sent to the [facility] on [address].

## 2019-08-02 ENCOUNTER — Ambulatory Visit: Payer: BC Managed Care – PPO | Admitting: Physical Medicine and Rehabilitation

## 2019-08-06 ENCOUNTER — Ambulatory Visit: Payer: BC Managed Care – PPO | Admitting: Physical Medicine and Rehabilitation

## 2019-08-08 ENCOUNTER — Ambulatory Visit: Payer: Self-pay

## 2019-08-08 ENCOUNTER — Ambulatory Visit (INDEPENDENT_AMBULATORY_CARE_PROVIDER_SITE_OTHER): Payer: BC Managed Care – PPO | Admitting: Physical Medicine and Rehabilitation

## 2019-08-08 ENCOUNTER — Other Ambulatory Visit: Payer: Self-pay

## 2019-08-08 DIAGNOSIS — M25551 Pain in right hip: Secondary | ICD-10-CM

## 2019-08-08 NOTE — Progress Notes (Signed)
 .  Numeric Pain Rating Scale and Functional Assessment Average Pain 9   In the last MONTH (on 0-10 scale) has pain interfered with the following?  1. General activity like being  able to carry out your everyday physical activities such as walking, climbing stairs, carrying groceries, or moving a chair?  Rating(9)   -Dye Allergies.  

## 2019-08-09 DIAGNOSIS — M25551 Pain in right hip: Secondary | ICD-10-CM | POA: Diagnosis not present

## 2019-08-09 MED ORDER — TRIAMCINOLONE ACETONIDE 40 MG/ML IJ SUSP
60.0000 mg | INTRAMUSCULAR | Status: AC | PRN
  Administered 2019-08-09: 60 mg via INTRA_ARTICULAR

## 2019-08-09 MED ORDER — BUPIVACAINE HCL 0.25 % IJ SOLN
4.0000 mL | INTRAMUSCULAR | Status: AC | PRN
  Administered 2019-08-09: 06:00:00 4 mL via INTRA_ARTICULAR

## 2019-08-09 NOTE — Progress Notes (Signed)
   Wesley Lawson - 64 y.o. male MRN [id number]  Date of birth: 11-19-1955  Office Visit Note: Visit Date: 08/08/2019 PCP: Carlena Hurl, PA-C Referred by: Carlena Hurl, PA-C  Subjective: Chief Complaint  Patient presents with  . Left Hip - Pain  . Right Hip - Pain   HPI:  Wesley Lawson is a 64 y.o. male who comes in today For planned bilateral intra-articular hip injections with fluoroscopic guidance.  This is requested by Benita Stabile, P.A.-C who has seen him in recent follow-up.  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  He reports to me today that he really wants the injection just in the right hip this is giving him the most problems.  We did complete diagnostic and for therapeutic right intra-articular hip injection today.  ROS Otherwise per HPI.  Assessment & Plan: Visit Diagnoses:  1. Pain in right hip     Plan: No additional findings.   Meds & Orders: No orders of the defined types were placed in this encounter.   Orders Placed This Encounter  Procedures  . Large Joint Inj: R hip joint  . XR C-ARM NO REPORT    Follow-up: Return if symptoms worsen or fail to improve.   Procedures: Large Joint Inj: R hip joint on 08/09/2019 6:12 AM Indications: diagnostic evaluation and pain Details: 22 G 3.5 in needle, fluoroscopy-guided anterior approach  Arthrogram: No  Medications: 4 mL bupivacaine 0.25 %; 60 mg triamcinolone acetonide 40 MG/ML Outcome: tolerated well, no immediate complications  There was excellent flow of contrast producing a partial arthrogram of the hip. The patient did have relief of symptoms during the anesthetic phase of the injection. Procedure, treatment alternatives, risks and benefits explained, specific risks discussed. Consent was given by the patient. Immediately prior to procedure a time out was called to verify the correct patient, procedure, equipment, support staff and site/side marked as  required. Patient was prepped and draped in the usual sterile fashion.      No notes on file   Clinical History: No specialty comments available.     Objective:  VS:  HT:    WT:   BMI:     BP:   HR: bpm  TEMP: ( )  RESP:  Physical Exam  Ortho Exam Imaging: XR C-ARM NO REPORT  Result Date: 08/08/2019 Please see Notes tab for imaging impression.

## 2019-09-10 ENCOUNTER — Encounter (HOSPITAL_COMMUNITY): Payer: Self-pay

## 2019-09-10 ENCOUNTER — Other Ambulatory Visit: Payer: Self-pay

## 2019-09-10 ENCOUNTER — Ambulatory Visit (HOSPITAL_COMMUNITY)
Admission: EM | Admit: 2019-09-10 | Discharge: 2019-09-10 | Disposition: A | Discharge status: Home / Self Care | Payer: BC Managed Care – PPO | Attending: Family Medicine | Admitting: Family Medicine

## 2019-09-10 DIAGNOSIS — J029 Acute pharyngitis, unspecified: Secondary | ICD-10-CM | POA: Diagnosis not present

## 2019-09-10 MED ORDER — AZITHROMYCIN 250 MG PO TABS: 250.0000 mg | ORAL_TABLET | Freq: Every day | ORAL | 0 refills | Status: DC

## 2019-09-10 NOTE — ED Triage Notes (Signed)
Pt reports sore throat starting Saturday. Denies fever, chills, body aches, headache, nasal congestion, runny nose.

## 2019-09-10 NOTE — Discharge Instructions (Signed)
Please try things such as zyrtec-D or allegra-D which is an antihistamine and decongestant.  Please also try using a netti pot on a regular occasion. Please try honey, vick's vapor rub, lozenges and humidifer for sore throat  Please use the azithromycin if your symptoms fail to improve.  Please follow up if your symptoms fail to improve.

## 2019-09-10 NOTE — ED Provider Notes (Signed)
[facility]    CSN: [id number] Arrival date & time: 09/10/19  1725      History   Chief Complaint Chief Complaint  Patient presents with  . Sore Throat    HPI Wesley Lawson is a 64 y.o. male.   He is presenting with sore throat.  It is ongoing for the past few days.  Denies any fevers.  He had Covid earlier in January.  He has received one of the vaccines.  Denies any possible exposure anyone has been sick.  Physical symptoms are staying the same.  HPI  Past Medical History:  Diagnosis Date  . Abnormal EKG   . Allergy   . Arthritis    hands, knees  . Chest pain    a. normal echo stress test in 2010 with mild to moderate LVH.   Marland Kitchen Dyslipidemia   . GERD (gastroesophageal reflux disease)   . History of exercise stress test 08/2016   normal  . Hyperlipidemia   . Hypertension     Patient Active Problem List   Diagnosis Date Noted  . Primary osteoarthritis of right hip 07/23/2019  . COVID-19 07/06/2019  . SOB (shortness of breath) 07/06/2019  . Fever 07/06/2019  . Dehydration 07/06/2019  . Need for pneumococcal vaccination 05/17/2019  . Paresthesia 05/17/2019  . Benign prostatic hyperplasia with lower urinary tract symptoms 05/15/2019  . Dysuria 05/15/2019  . Low testosterone 05/15/2019  . Decreased hearing of left ear 04/11/2019  . Impacted cerumen of left ear 04/11/2019  . Skin tag 04/11/2019  . Lipoma 04/11/2019  . Tubular adenoma 11/02/2017  . Screen for colon cancer 06/13/2017  . Screening for prostate cancer 06/13/2017  . Screening for diabetes mellitus 06/13/2017  . Muscle tightness 06/13/2017  . Herpes zoster without complication [id number]  . Cold sore 10/21/2016  . Hyperlipidemia 07/30/2016  . Erectile dysfunction 07/30/2016  . Leg pain, anterior, unspecified laterality 07/30/2016  . 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 Primary osteoarthritis of left hip 07/30/2016  . Abnormal EKG 07/30/2016  . Right-sided low back pain without  sciatica 10/13/2015  . Obesity 10/13/2015  . Essential hypertension 10/13/2015  . Decreased range of hip movement 10/13/2015  . Right hip pain 02/21/2014  . Numbness of left hand 02/07/2014  . Pain of right thumb 02/07/2014    Past Surgical History:  Procedure Laterality Date  . COLONOSCOPY  07/21/2017   tubular adenoma, Dr. Wilfrid Lund  . WISDOM TOOTH EXTRACTION         Home Medications    Prior to Admission medications   Medication Sig Start Date End Date Taking? Authorizing Provider  acetaminophen (TYLENOL) 325 MG tablet Take 650 mg by mouth every 6 (six) hours as needed.    [provider]  albuterol (VENTOLIN HFA) 108 (90 Base) MCG/ACT inhaler Inhale 2 puffs into the lungs every 6 (six) hours as needed for wheezing or shortness of breath. 07/09/19   Tysinger, Camelia Eng, PA-C  aspirin EC 81 MG tablet Take 1 tablet (81 mg total) by mouth daily. 05/14/19   Tysinger, Camelia Eng, PA-C  azithromycin (ZITHROMAX) 250 MG tablet Take 1 tablet (250 mg total) by mouth daily. Take first 2 tablets together, then 1 every day until finished. 09/10/19   Rosemarie Ax, MD  benzonatate (TESSALON) 200 MG capsule Take 1 capsule (200 mg total) by mouth 3 (three) times daily as needed for cough. 07/09/19   Tysinger, Camelia Eng, PA-C  doxycycline (VIBRA-TABS) 100 MG tablet Take  1 tablet (100 mg total) by mouth 2 (two) times daily. 06/28/19   Tysinger, Camelia Eng, PA-C  fluticasone (FLONASE) 50 MCG/ACT nasal spray Place 1 spray into both nostrils daily. 07/27/18   Tysinger, Camelia Eng, PA-C  ibuprofen (ADVIL) 800 MG tablet Take 1 tablet (800 mg total) by mouth every 8 (eight) hours as needed. 07/31/19   Tysinger, Camelia Eng, PA-C  lisinopril-hydrochlorothiazide (ZESTORETIC) 20-25 MG tablet Take 1 tablet by mouth daily. 05/14/19   Tysinger, Camelia Eng, PA-C  Multiple Vitamins-Minerals (EMERGEN-C IMMUNE PLUS) PACK Take 1 tablet by mouth 2 (two) times daily. 07/09/19   Tysinger, Camelia Eng, PA-C  pravastatin (PRAVACHOL) 20  MG tablet Take 1 tablet (20 mg total) by mouth every evening. 04/25/19 04/24/20  Rita Ohara, MD  sildenafil (REVATIO) 20 MG tablet 1-5 tablets (20 mg to 100 mg) prior to sexual activity daily prn 09/29/16   Tysinger, Camelia Eng, PA-C  valACYclovir (VALTREX) 1000 MG tablet TAKE 2 TABLETS BY MOUTH TWICE DAILY FOR 1 DAY FOR COLD SORE OR THREE TIMES DAILY FOR 1 WEEK FOR SHINGLES 12/29/17   Tysinger, Camelia Eng, PA-C    Family History Family History  Problem Relation Age of Onset  . Diabetes Mother   . Stroke Mother   . Diabetes Sister   . Asthma Daughter   . Colon polyps Neg Hx   . Esophageal cancer Neg Hx   . Rectal cancer Neg Hx   . Stomach cancer Neg Hx   . Cancer Neg Hx   . Heart disease Neg Hx   . Colon cancer Neg Hx   . Liver cancer Neg Hx   . Pancreatic cancer Neg Hx     Social History Social History   Tobacco Use  . Smoking status: Never Smoker  . Smokeless tobacco: Never Used  Substance Use Topics  . Alcohol use: Yes    Alcohol/week: 3.0 standard drinks    Types: 3 Cans of beer per week  . Drug use: No     Allergies   Patient has no known allergies.   Review of Systems Review of Systems See HPI  Physical Exam Triage Vital Signs ED Triage Vitals  Enc Vitals Group     BP 09/10/19 1800 (!) 153/76     Pulse Rate 09/10/19 1800 85     Resp 09/10/19 1800 14     Temp 09/10/19 1800 99.2 F (37.3 C)     Temp Source 09/10/19 1800 Oral     SpO2 09/10/19 1800 96 %     Weight 09/10/19 1758 241 lb (109.3 kg)     Height --      Head Circumference --      Peak Flow --      Pain Score 09/10/19 1758 8     Pain Loc --      Pain Edu? --      Excl. in [place]? --    No data found.  Updated Vital Signs BP (!) 153/76 (BP Location: Right Arm)   Pulse 85   Temp 99.2 F (37.3 C) (Oral)   Resp 14   Wt 109.3 kg   SpO2 96%   BMI 34.58 kg/m   Visual Acuity Right Eye Distance:   Left Eye Distance:   Bilateral Distance:    Right Eye Near:   Left Eye Near:    Bilateral  Near:     Physical Exam Gen: NAD, alert, cooperative with exam, well-appearing ENT: normal lips, normal nasal mucosa, tympanic membranes clear  and intact bilaterally, normal oropharynx, no cervical lymphadenopathy Eye: normal EOM, normal conjunctiva and lids CV:  no edema, +2 pedal pulses, regular rate and rhythm, S1-S2   Resp: no accessory muscle use, non-labored, clear to auscultation bilaterally, no crackles or wheezes Skin: no rashes, no areas of induration  Neuro: normal tone, normal sensation to touch Psych:  normal insight, alert and oriented MSK: Normal gait, normal strength    UC Treatments / Results  Labs (all labs ordered are listed, but only abnormal results are displayed) Labs Reviewed - No data to display  EKG   Radiology No results found.  Procedures Procedures (including critical care time)  Medications Ordered in UC Medications - No data to display  Initial Impression / Assessment and Plan / UC Course  I have reviewed the triage vital signs and the nursing notes.  Pertinent labs & imaging results that were available during my care of the patient were reviewed by me and considered in my medical decision making (see chart for details).     Mr. Fruth is a 64 year old male is presenting with sore throat.  Likely viral in nature or related to seasonal allergies.  Counseled on supportive care.  Provided azithromycin if his symptoms fail to improve given indications to follow-up and return.  Final Clinical Impressions(s) / UC Diagnoses   Final diagnoses:  Sore throat     Discharge Instructions     Please try things such as zyrtec-D or allegra-D which is an antihistamine and decongestant.  Please also try using a netti pot on a regular occasion. Please try honey, vick's vapor rub, lozenges and humidifer for sore throat  Please use the azithromycin if your symptoms fail to improve.  Please follow up if your symptoms fail to improve.     ED  Prescriptions    Medication Sig Dispense Auth. Provider   azithromycin (ZITHROMAX) 250 MG tablet Take 1 tablet (250 mg total) by mouth daily. Take first 2 tablets together, then 1 every day until finished. 6 tablet Rosemarie Ax, MD     PDMP not reviewed this encounter.   Rosemarie Ax, MD 09/10/19 [phone]

## 2019-09-11 NOTE — Progress Notes (Signed)
Start time: 10:06 End time: 10:23 Virtual Visit via Video Note  I connected with Wesley Lawson on 09/12/2019 by a video enabled telemedicine application and verified that I am speaking with the correct person using two identifiers.  Location: Patient: home Provider: office   I discussed the limitations of evaluation and management by telemedicine and the availability of in person appointments. The patient expressed understanding and agreed to proceed.  History of Present Illness:  Chief Complaint  Patient presents with  . Sore Throat    VIRTUAL-sore throat, swollen and hurts to swallow. Thinks it might be an allergic reaction to something. Went to UC on Monday evening and they gave him and abx. He thought it might it be strep but they didn't check him for strep.    Patient was seen in Urgent Care on 09/10/2019 with complaint of sore throat. Evaluation/exam was normal. It was felt to be likely viral in nature or related to seasonal allergies.  He had been counseled on supportive care. He was prescribed azithromycin if his symptoms failed to improve.  He reports that ST started Friday (5 days ago), got worse, so went to UC--hurt to swallow, and is somewhat hard to swallow, feels swollen.  Denies any runny nose, sneezing.  He has a "scratchy" cough, like mucus is in the back of the throat.  Not able to get it up. He has been gargling with Listerine, helps some. He started the z-pak yesterday.  It is feeling somewhat better.  He feels it mainly on the left side of his throat. He took 2 benadryl this morning.   Has h/o allergies, prescribed flonase last year. He ran out, didn't have refills.  He recalls this helping last year with allergies.  PMH, PSH, SH reviewed  Outpatient Encounter Medications as of 09/12/2019  Medication Sig Note  . aspirin EC 81 MG tablet Take 1 tablet (81 mg total) by mouth daily.   Marland Kitchen azithromycin (ZITHROMAX) 250 MG tablet Take 1 tablet (250 mg total) by mouth  daily. Take first 2 tablets together, then 1 every day until finished.   . diphenhydrAMINE (BENADRYL) 25 MG tablet Take 50 mg by mouth every 6 (six) hours as needed. 09/12/2019: Took at 9:30am this morning  . lisinopril-hydrochlorothiazide (ZESTORETIC) 20-25 MG tablet Take 1 tablet by mouth daily.   . pravastatin (PRAVACHOL) 20 MG tablet Take 1 tablet (20 mg total) by mouth every evening.   . sildenafil (REVATIO) 20 MG tablet 1-5 tablets (20 mg to 100 mg) prior to sexual activity daily prn   . acetaminophen (TYLENOL) 325 MG tablet Take 650 mg by mouth every 6 (six) hours as needed.   Marland Kitchen albuterol (VENTOLIN HFA) 108 (90 Base) MCG/ACT inhaler Inhale 2 puffs into the lungs every 6 (six) hours as needed for wheezing or shortness of breath. (Patient not taking: Reported on 09/12/2019)   . fluticasone (FLONASE) 50 MCG/ACT nasal spray Place 1 spray into both nostrils daily. (Patient not taking: Reported on 09/12/2019)   . ibuprofen (ADVIL) 800 MG tablet Take 1 tablet (800 mg total) by mouth every 8 (eight) hours as needed. (Patient not taking: Reported on 09/12/2019)   . valACYclovir (VALTREX) 1000 MG tablet TAKE 2 TABLETS BY MOUTH TWICE DAILY FOR 1 DAY FOR COLD SORE OR THREE TIMES DAILY FOR 1 WEEK FOR SHINGLES (Patient not taking: Reported on 09/12/2019)   . [DISCONTINUED] benzonatate (TESSALON) 200 MG capsule Take 1 capsule (200 mg total) by mouth 3 (three) times daily as needed  for cough.   . [DISCONTINUED] doxycycline (VIBRA-TABS) 100 MG tablet Take 1 tablet (100 mg total) by mouth 2 (two) times daily.   . [DISCONTINUED] Multiple Vitamins-Minerals (EMERGEN-C IMMUNE PLUS) PACK Take 1 tablet by mouth 2 (two) times daily.    Facility-Administered Encounter Medications as of 09/12/2019  Medication  . 0.9 %  sodium chloride infusion   No Known Allergies  ROS: no fever, chills, headaches, sinus pain.  +sore throat, drainage and cough per HPI. No discolored mucus or phlegm. No ear pain. No GI complaints or  rashes. Denies tongue swelling, difficulty swallowing (other than pain).  Observations/Objective:  Temp (!) 97 F (36.1 C) (Temporal)   Ht 5\' 10"  (1.778 m)   Wt 240 lb (108.9 kg)   BMI 34.44 kg/m   Well appearing, pleasant male, in no distress. He is alert, oriented, speaking comfortable. No throat-clearing or coughing during visit. Cranial nerves are grossly intact. Exam is limited due to virtual nature of visit.   Assessment and Plan:  Allergic rhinitis, unspecified seasonality, unspecified trigger - recommended once daily antihistamine (in place of benadryl), and to start Flonase. Mucinex prn, and other supportive measures - Plan: fluticasone (FLONASE) 50 MCG/ACT nasal spray  Sore throat - Ddx reviewed--likely allergies given normal exam in UC 2d ago and h/o allergies. Improved some since starting ABX, so to complete course    Drink plenty of water. Start taking an antihistamine such as claritin or allegra or zyrtec (in place of benadryl).  Take it once daily. Restart using Flonase (prescription was sent in)--use this 2 sprays into each nostril once daily throughout the spring during allergies. I recommend using guaifenesin (the expectorant that is found in mucinex and robitussin).  This will help loosen up any thick mucus that might be draining down the throat. You can use tylenol or ibuprofen as needed for pain, in addition to salt water gargles.  Since you started the antibiotic, go ahead and finish the course of the z-pak.  If you develop fever, if you notice significant swelling or blockage of your throat, trouble breathing, eating or swallowing, please seek immediate evaluation.   Follow Up Instructions:    I discussed the assessment and treatment plan with the patient. The patient was provided an opportunity to ask questions and all were answered. The patient agreed with the plan and demonstrated an understanding of the instructions.   The patient was advised to  call back or seek an in-person evaluation if the symptoms worsen or if the condition fails to improve as anticipated.  I provided 17 minutes of video face-to-face time during this encounter. Additional time spent in chart review and documentation.   Vikki Ports, MD

## 2019-09-12 ENCOUNTER — Encounter: Payer: Self-pay | Admitting: Family Medicine

## 2019-09-12 ENCOUNTER — Other Ambulatory Visit: Payer: Self-pay

## 2019-09-12 ENCOUNTER — Ambulatory Visit (INDEPENDENT_AMBULATORY_CARE_PROVIDER_SITE_OTHER): Payer: BC Managed Care – PPO | Admitting: Family Medicine

## 2019-09-12 VITALS — Temp 97.0°F | Ht 70.0 in | Wt 240.0 lb

## 2019-09-12 DIAGNOSIS — J029 Acute pharyngitis, unspecified: Secondary | ICD-10-CM

## 2019-09-12 DIAGNOSIS — J309 Allergic rhinitis, unspecified: Secondary | ICD-10-CM | POA: Diagnosis not present

## 2019-09-12 MED ORDER — FLUTICASONE PROPIONATE 50 MCG/ACT NA SUSP: 1.0000 | Freq: Every day | NASAL | 5 refills | Status: DC

## 2019-09-12 NOTE — Patient Instructions (Signed)
Drink plenty of water. Start taking an antihistamine such as claritin or allegra or zyrtec (in place of benadryl).  Take it once daily. Restart using Flonase (prescription was sent in)--use this 2 sprays into each nostril once daily throughout the spring during allergies. I recommend using guaifenesin (the expectorant that is found in mucinex and robitussin).  This will help loosen up any thick mucus that might be draining down the throat. You can use tylenol or ibuprofen as needed for pain, in addition to salt water gargles.  Since you started the antibiotic, go ahead and finish the course of the z-pak.  If you develop fever, if you notice significant swelling or blockage of your throat, trouble breathing, eating or swallowing, please seek immediate evaluation.

## 2019-11-21 ENCOUNTER — Other Ambulatory Visit: Payer: Self-pay

## 2019-11-21 ENCOUNTER — Encounter: Payer: Self-pay | Admitting: Medical

## 2019-11-21 ENCOUNTER — Telehealth (INDEPENDENT_AMBULATORY_CARE_PROVIDER_SITE_OTHER): Payer: BC Managed Care – PPO | Admitting: Medical

## 2019-11-21 VITALS — Wt 232.0 lb

## 2019-11-21 DIAGNOSIS — R05 Cough: Secondary | ICD-10-CM

## 2019-11-21 DIAGNOSIS — R059 Cough, unspecified: Secondary | ICD-10-CM

## 2019-11-21 DIAGNOSIS — R0981 Nasal congestion: Secondary | ICD-10-CM | POA: Diagnosis not present

## 2019-11-21 DIAGNOSIS — R0989 Other specified symptoms and signs involving the circulatory and respiratory systems: Secondary | ICD-10-CM

## 2019-11-21 MED ORDER — AFRIN NASAL SPRAY 0.05 % NA SOLN: 1.0000 | Freq: Two times a day (BID) | NASAL | 0 refills | Status: DC

## 2019-11-21 MED ORDER — HYDROCODONE-HOMATROPINE 5-1.5 MG/5ML PO SYRP: 5.0000 mL | ORAL_SOLUTION | Freq: Three times a day (TID) | ORAL | 0 refills | Status: AC | PRN

## 2019-11-21 MED ORDER — AZITHROMYCIN 250 MG PO TABS: ORAL_TABLET | ORAL | 0 refills | Status: DC

## 2019-11-21 NOTE — Progress Notes (Signed)
  Subjective:     Patient ID: Wesley Lawson, male   DOB: [birth year], 64 y.o.   MRN: [id number]  This visit type was conducted due to national recommendations for restrictions regarding the COVID-19 Pandemic (e.g. social distancing) in an effort to limit this patient's exposure and mitigate transmission in our community.  Due to their co-morbid illnesses, this patient is at least at moderate risk for complications without adequate follow up.  This format is felt to be most appropriate for this patient at this time.    Documentation for virtual audio and video telecommunications through Zoom encounter:  The patient was located at home. The provider was located in the office. The patient did consent to this visit and is aware of possible charges through their insurance for this visit.  The other persons participating in this telemedicine service were none. Time spent on call was 20 minutes and in review of previous records 20 minutes total.  This virtual service is not related to other E/M service within previous 7 days.   HPI Chief Complaint  Patient presents with  . other    acute visit for runny nose and cough, congestion in chest, started last Wednesday, refill on valtrex   Here for 6 day hx/o cold symptoms.   He notes sneezing, watery eyes, runny nose, chest congestion.  For several days, been using some sudafed, allergy medication Allerest, nasal spray x 1 day, cough medication.  Not sure about pressure in sinuses.   Feels some rattly congestion in chest.   He has been doing some painting.  No other exposure that he feels is aggravating his allergies or sinus.  No fever.  No sick contacts.  No wheezing.  He had Covid several months ago.  He is also had both Covid vaccine doses at this point.   no other aggravating or relieving factors. No other complaint.   Review of Systems As in subjective    Objective:   Physical Exam Due to coronavirus pandemic stay at home measures, patient  visit was virtual and they were not examined in person.   Wt 232 lb (105.2 kg)   BMI 33.29 kg/m      Assessment:     Encounter Diagnoses  Name Primary?  . Cough Yes  . Sinus congestion   . Phlegm in throat        Plan:     Discussed the symptoms and concerns.  I suspect more of a common cold than anything.  He will continue supportive measures, rest, hydration.  He can use Hycodan for worse cough at night.  He will use Afrin morning and night for the next 3 to 4 days only.  He can continue over-the-counter medicine for congestion but not Sudafed due to high blood pressure.  If worse in the next few days particular his fever, darker colored mucus, overall sinus pressure or worse chest congestion then begin Zpak.  Jarod was seen today for other.  Diagnoses and all orders for this visit:  Cough  Sinus congestion  Phlegm in throat  Other orders -     HYDROcodone-homatropine (HYCODAN) 5-1.5 MG/5ML syrup; Take 5 mLs by mouth every 8 (eight) hours as needed for up to 5 days. -     azithromycin (ZITHROMAX) 250 MG tablet; 2 tablets day 1, then 1 tablet days 2-4 -     oxymetazoline (AFRIN NASAL SPRAY) 0.05 % nasal spray; Place 1 spray into both nostrils 2 (two) times daily.  f/u prn

## 2019-11-25 ENCOUNTER — Ambulatory Visit (HOSPITAL_COMMUNITY)
Admission: EM | Admit: 2019-11-25 | Discharge: 2019-11-25 | Disposition: A | Discharge status: Home / Self Care | Payer: BC Managed Care – PPO | Attending: Internal Medicine | Admitting: Internal Medicine

## 2019-11-25 ENCOUNTER — Encounter (HOSPITAL_COMMUNITY): Payer: Self-pay

## 2019-11-25 ENCOUNTER — Other Ambulatory Visit: Payer: Self-pay

## 2019-11-25 DIAGNOSIS — S99921A Unspecified injury of right foot, initial encounter: Secondary | ICD-10-CM

## 2019-11-25 MED ORDER — BACITRACIN ZINC 500 UNIT/GM EX OINT: TOPICAL_OINTMENT | CUTANEOUS | 0 refills | Status: DC | PRN

## 2019-11-25 NOTE — ED Triage Notes (Signed)
Pt presents today after stupming right great toe on Friday. Pt states toe was sore at first then woke up on Saturday to toe bleeding. Pt has treated affected extremity with hydrogen peroxide. Pt has full ROM. Pt denies history of DM. Pt last dose ibuprofen at 0800. On assessment pt toe swollen, with small laceration.

## 2019-11-25 NOTE — Discharge Instructions (Addendum)
Keep area clean and dry. Apply ointment daily. Return if any increase in swelling, redness or fever.

## 2019-11-25 NOTE — ED Provider Notes (Signed)
[place]    CSN: [id number] Arrival date & time: 11/25/19  1441    History   Chief Complaint Chief Complaint  Patient presents with  . Toe Pain    HPI Wesley Lawson is a 63 y.o. male with pmh of htn, hyperlipidemia and GERD presents with pain to right great toe. Pt states he hit toe on bed post 2 days prior to visit and wanted to make sure it wasn't infected. Pt states toe a bit uncomfortable with shoes on, but no pain. Small amount of bleeding around nailbed. He has cleaned area with hydrogen peroxide. States toe swollen, but improved from day prior. No recent fever.    Past Medical History:  Diagnosis Date  . Abnormal EKG   . Allergy   . Arthritis    hands, knees  . Chest pain    a. normal echo stress test in 2010 with mild to moderate LVH.   Marland Kitchen Dyslipidemia   . GERD (gastroesophageal reflux disease)   . History of exercise stress test 08/2016   normal  . Hyperlipidemia   . Hypertension     Patient Active Problem List   Diagnosis Date Noted  . Primary osteoarthritis of right hip 07/23/2019  . COVID-19 07/06/2019  . SOB (shortness of breath) 07/06/2019  . Fever 07/06/2019  . Dehydration 07/06/2019  . Need for pneumococcal vaccination 05/17/2019  . Paresthesia 05/17/2019  . Benign prostatic hyperplasia with lower urinary tract symptoms 05/15/2019  . Dysuria 05/15/2019  . Low testosterone 05/15/2019  . Decreased hearing of left ear 04/11/2019  . Impacted cerumen of left ear 04/11/2019  . Skin tag 04/11/2019  . Lipoma 04/11/2019  . Tubular adenoma 11/02/2017  . Screen for colon cancer 06/13/2017  . Screening for prostate cancer 06/13/2017  . Screening for diabetes mellitus 06/13/2017  . Muscle tightness 06/13/2017  . Herpes zoster without complication 63/14/9702  . Cold sore 10/21/2016  . Hyperlipidemia 07/30/2016  . Erectile dysfunction 07/30/2016  . Leg pain, anterior, unspecified laterality 07/30/2016  . Spondylosis of lumbar spine  07/30/2016  . Primary osteoarthritis of left hip 07/30/2016  . Abnormal EKG 07/30/2016  . Right-sided low back pain without sciatica 10/13/2015  . Obesity 10/13/2015  . Essential hypertension 10/13/2015  . Decreased range of hip movement 10/13/2015  . Right hip pain 02/21/2014  . Numbness of left hand 02/07/2014  . Pain of right thumb 02/07/2014    Past Surgical History:  Procedure Laterality Date  . COLONOSCOPY  07/21/2017   tubular adenoma, Dr. Wilfrid Lund  . WISDOM TOOTH EXTRACTION        Home Medications    Prior to Admission medications   Medication Sig Start Date End Date Taking? Authorizing Provider  aspirin EC 81 MG tablet Take 1 tablet (81 mg total) by mouth daily. 05/14/19  Yes Tysinger, Camelia Eng, PA-C  lisinopril-hydrochlorothiazide (ZESTORETIC) 20-25 MG tablet Take 1 tablet by mouth daily. 05/14/19  Yes Tysinger, Camelia Eng, PA-C  pravastatin (PRAVACHOL) 20 MG tablet Take 1 tablet (20 mg total) by mouth every evening. 04/25/19 04/24/20 Yes Rita Ohara, MD  acetaminophen (TYLENOL) 325 MG tablet Take 650 mg by mouth every 6 (six) hours as needed.    [provider]  albuterol (VENTOLIN HFA) 108 (90 Base) MCG/ACT inhaler Inhale 2 puffs into the lungs every 6 (six) hours as needed for wheezing or shortness of breath. Patient not taking: Reported on 09/12/2019 07/09/19   Tysinger, Camelia Eng, PA-C  azithromycin (ZITHROMAX) 250 MG tablet  Take 1 tablet (250 mg total) by mouth daily. Take first 2 tablets together, then 1 every day until finished. Patient not taking: Reported on 11/21/2019 09/10/19   Rosemarie Ax, MD  azithromycin [facility]) 250 MG tablet 2 tablets day 1, then 1 tablet days 2-4 11/21/19   Tysinger, Camelia Eng, PA-C  bacitracin ointment Apply topically as needed for wound care. Apply to affected area daily 11/25/19   Rudolpho Sevin, NP  diphenhydrAMINE (BENADRYL) 25 MG tablet Take 50 mg by mouth every 6 (six) hours as needed.    [provider]  fluticasone  (FLONASE) 50 MCG/ACT nasal spray Place 1 spray into both nostrils daily. 09/12/19   Rita Ohara, MD  HYDROcodone-homatropine [facility]) 5-1.5 MG/5ML syrup Take 5 mLs by mouth every 8 (eight) hours as needed for up to 5 days. 11/21/19 11/26/19  Tysinger, Camelia Eng, PA-C  ibuprofen (ADVIL) 800 MG tablet Take 1 tablet (800 mg total) by mouth every 8 (eight) hours as needed. Patient not taking: Reported on 09/12/2019 07/31/19   Tysinger, Camelia Eng, PA-C  oxymetazoline (AFRIN NASAL SPRAY) 0.05 % nasal spray Place 1 spray into both nostrils 2 (two) times daily. 11/21/19   Tysinger, Camelia Eng, PA-C  sildenafil (REVATIO) 20 MG tablet 1-5 tablets (20 mg to 100 mg) prior to sexual activity daily prn Patient not taking: Reported on 11/21/2019 09/29/16   Tysinger, Camelia Eng, PA-C  valACYclovir (VALTREX) 1000 MG tablet TAKE 2 TABLETS BY MOUTH TWICE DAILY FOR 1 DAY FOR COLD SORE OR THREE TIMES DAILY FOR 1 WEEK FOR SHINGLES Patient not taking: Reported on 09/12/2019 12/29/17   Tysinger, Camelia Eng, PA-C    Family History Family History  Problem Relation Age of Onset  . Diabetes Mother   . Stroke Mother   . Diabetes Sister   . Asthma Daughter   . Colon polyps Neg Hx   . Esophageal cancer Neg Hx   . Rectal cancer Neg Hx   . Stomach cancer Neg Hx   . Cancer Neg Hx   . Heart disease Neg Hx   . Colon cancer Neg Hx   . Liver cancer Neg Hx   . Pancreatic cancer Neg Hx     Social History Social History   Tobacco Use  . Smoking status: Never Smoker  . Smokeless tobacco: Never Used  Vaping Use  . Vaping Use: Never used  Substance Use Topics  . Alcohol use: Yes    Alcohol/week: 3.0 standard drinks    Types: 3 Cans of beer per week  . Drug use: No     Allergies   Patient has no known allergies.   Review of Systems As stated in hpi, otherwise negative   Physical Exam Triage Vital Signs ED Triage Vitals  Enc Vitals Group     BP 11/25/19 1508 (!) 154/80     Pulse Rate 11/25/19 1508 (!) 116     Resp --      Temp  11/25/19 1508 98.8 F (37.1 C)     Temp Source 11/25/19 1508 Oral     SpO2 11/25/19 1508 99 %     Weight --      Height --      Head Circumference --      Peak Flow --      Pain Score 11/25/19 1509 7     Pain Loc --      Pain Edu? --      Excl. in GC? --    No  data found.  Updated Vital Signs BP (!) 154/80 (BP Location: Left Arm)   Pulse (!) 116   Temp 98.8 F (37.1 C) (Oral)   SpO2 99%   Visual Acuity Right Eye Distance:   Left Eye Distance:   Bilateral Distance:    Right Eye Near:   Left Eye Near:    Bilateral Near:     Physical Exam Constitutional:      General: He is not in acute distress.    Appearance: Normal appearance.  Musculoskeletal:        General: Tenderness and signs of injury present. No deformity. Normal range of motion.  Skin:    General: Skin is warm and dry.     Comments: Right great toe with mild swelling and bruising to lateral aspect around nailbed. No subungual hematoma. No erythema.   Neurological:     Mental Status: He is alert.  Psychiatric:        Mood and Affect: Mood normal.        Behavior: Behavior normal.     UC Treatments / Results  Labs (all labs ordered are listed, but only abnormal results are displayed) Labs Reviewed - No data to display  EKG  Radiology No results found.   Medications Ordered in UC Medications - No data to display  Initial Impression / Assessment and Plan / UC Course  I have reviewed the triage vital signs and the nursing notes.  Pertinent labs & imaging results that were available during my care of the patient were reviewed by me and considered in my medical decision making (see chart for details).  Hematoma, right great toe -secondary to trauma. No deformity -No evidence of infection -Keep area alongside nailbed clean c bacitracin -return for any worsening swelling, pain or evidence of infx -Pt to trim nails when hematoma resolves  Final Clinical Impressions(s) / UC Diagnoses   Final  diagnoses:  Toe injury, right, initial encounter     Discharge Instructions     Keep area clean and dry. Apply ointment daily. Return if any increase in swelling, redness or fever.    ED Prescriptions    Medication Sig Dispense Auth. Provider   bacitracin ointment Apply topically as needed for wound care. Apply to affected area daily 30 g Rudolpho Sevin, NP     PDMP not reviewed this encounter.   Rudolpho Sevin, NP 11/25/19 [phone]

## 2019-11-30 ENCOUNTER — Telehealth: Payer: Self-pay | Admitting: Physical Medicine and Rehabilitation

## 2019-11-30 NOTE — Telephone Encounter (Signed)
Patient called requesting a call back. Patient needs to make an appointment with Dr. Ernestina Patches office for right hip cortisone injection. Patient phone number is [phone].

## 2019-12-02 ENCOUNTER — Other Ambulatory Visit: Payer: Self-pay | Admitting: Medical

## 2019-12-03 NOTE — Telephone Encounter (Signed)
ok 

## 2019-12-03 NOTE — Telephone Encounter (Signed)
Patient's last right hip injection was 08/08/19. Ok to repeat?

## 2019-12-03 NOTE — Telephone Encounter (Signed)
Pt is scheduled for 01/01/20

## 2019-12-27 ENCOUNTER — Other Ambulatory Visit: Payer: Self-pay | Admitting: Orthopedic Surgery

## 2020-01-01 ENCOUNTER — Ambulatory Visit: Payer: BC Managed Care – PPO | Admitting: Physical Medicine and Rehabilitation

## 2020-01-02 ENCOUNTER — Encounter (HOSPITAL_COMMUNITY): Payer: Self-pay

## 2020-01-02 NOTE — Patient Instructions (Addendum)
DUE TO COVID-19 ONLY ONE VISITOR IS ALLOWED TO COME WITH YOU AND STAY IN THE WAITING ROOM ONLY DURING PRE OP AND PROCEDURE DAY OF SURGERY. TWO VISITOR MAY VISIT WITH YOU AFTER SURGERY IN YOUR PRIVATE ROOM DURING VISITING HOURS ONLY! 10-a-8p   YOU NEED TO HAVE A COVID 19 TEST ON_8-5-21______ @_______ , THIS TEST MUST BE DONE BEFORE SURGERY, COVID TESTING SITE [address] [place] [address], IT IS ON THE RIGHT GOING OUT [address] APPROXITAMELTELY 2 MINUTES PAST [employer] ON THE RIGHT. ONCE YOUR COVID TEST IS COMPLETED,  PLEASE BEGIN THE QUARANTINE INSTRUCTIONS AS OUTLINED IN YOUR HANDOUT.           _____________________________________________________________________                      Wesley Lawson  01/02/2020   Your procedure is scheduled on: 01-21-20   Report to [facility] Main  Entrance   Report to short stay at      0530 AM     Call this number if you have problems the morning of surgery [phone]    Remember: NO SOLID FOOD AFTER MIDNIGHT THE NIGHT PRIOR TO SURGERY. NOTHING BY MOUTH EXCEPT CLEAR LIQUIDS UNTIL   0415 am  . PLEASE FINISH ENSURE DRINK PER                                       SURGEON ORDER  WHICH NEEDS TO BE COMPLETED AT     [place] am then nothing by mouth.    CLEAR LIQUID DIET       Foods Allowed                                                                                    Foods Excluded  Coffee and tea, regular and decaf  No creamer                           liquids that you cannot  Plain Jell-O any favor except red or purple                                           see through such as: Fruit ices (not with fruit pulp)                                                           milk, soups, orange juice  Iced Popsicles  All solid food Carbonated beverages, regular and diet                                    Cranberry, grape and apple juices Sports drinks like  Gatorade Lightly seasoned clear broth or consume(fat free) Sugar, honey syrup  _____________________________________________________________________   BRUSH YOUR TEETH MORNING OF SURGERY AND RINSE YOUR MOUTH OUT, NO CHEWING GUM CANDY OR MINTS.     Take these medicines the morning of surgery with A SIP OF WATER: inhaler bring woth you                                 You may not have any metal on your body including hair pins and              piercings  Do not wear jewelry, , lotions, powders or perfumes, deodorant                      Men may shave face and neck.   Do not bring valuables to the hospital. [place].  Contacts, dentures or bridgework may not be worn into surgery.      Patients discharged the day of surgery will not be allowed to drive home. IF YOU ARE HAVING SURGERY AND GOING HOME THE SAME DAY, YOU MUST HAVE AN ADULT TO DRIVE YOU HOME AND BE WITH YOU FOR 24 HOURS. YOU MAY GO HOME BY TAXI OR UBER OR ORTHERWISE, BUT AN ADULT MUST ACCOMPANY YOU HOME AND STAY WITH YOU FOR 24 HOURS.  Name and phone number of your driver:  Special Instructions: N/A              Please read over the following fact sheets you were given: _____________________________________________________________________             Wesley Lawson & Wesley Lawson [facility] - Preparing for Surgery Before surgery, you can play an important role.  Because skin is not sterile, your skin needs to be as free of germs as possible.  You can reduce the number of germs on your skin by washing with CHG (chlorahexidine gluconate) soap before surgery.  CHG is an antiseptic cleaner which kills germs and bonds with the skin to continue killing germs even after washing. Please DO NOT use if you have an allergy to CHG or antibacterial soaps.  If your skin becomes reddened/irritated stop using the CHG and inform your nurse when you arrive at Short Stay. Do not shave (including legs and underarms) for  at least 48 hours prior to the first CHG shower.  You may shave your face/neck. Please follow these instructions carefully:  1.  Shower with CHG Soap the night before surgery and the  morning of Surgery.  2.  If you choose to wash your hair, wash your hair first as usual with your  normal  shampoo.  3.  After you shampoo, rinse your hair and body thoroughly to remove the  shampoo.                           4.  Use CHG as you would any other liquid soap.  You can apply chg directly  to the skin and wash  Gently with a scrungie or clean washcloth.  5.  Apply the CHG Soap to your body ONLY FROM THE NECK DOWN.   Do not use on face/ open                           Wound or open sores. Avoid contact with eyes, ears mouth and genitals (private parts).                       Wash face,  Genitals (private parts) with your normal soap.             6.  Wash thoroughly, paying special attention to the area where your surgery  will be performed.  7.  Thoroughly rinse your body with warm water from the neck down.  8.  DO NOT shower/wash with your normal soap after using and rinsing off  the CHG Soap.                9.  Pat yourself dry with a clean towel.            10.  Wear clean pajamas.            11.  Place clean sheets on your bed the night of your first shower and do not  sleep with pets. Day of Surgery : Do not apply any lotions/deodorants the morning of surgery.  Please wear clean clothes to the hospital/surgery center.  FAILURE TO FOLLOW THESE INSTRUCTIONS MAY RESULT IN THE CANCELLATION OF YOUR SURGERY PATIENT SIGNATURE_________________________________  NURSE SIGNATURE__________________________________  ________________________________________________________________________   Wesley Lawson  An incentive spirometer is a tool that can help keep your lungs clear and active. This tool measures how well you are filling your lungs with each breath. Taking long deep  breaths may help reverse or decrease the chance of developing breathing (pulmonary) problems (especially infection) following:  A long period of time when you are unable to move or be active. BEFORE THE PROCEDURE   If the spirometer includes an indicator to show your best effort, your nurse or respiratory therapist will set it to a desired goal.  If possible, sit up straight or lean slightly forward. Try not to slouch.  Hold the incentive spirometer in an upright position. INSTRUCTIONS FOR USE  1. Sit on the edge of your bed if possible, or sit up as far as you can in bed or on a chair. 2. Hold the incentive spirometer in an upright position. 3. Breathe out normally. 4. Place the mouthpiece in your mouth and seal your lips tightly around it. 5. Breathe in slowly and as deeply as possible, raising the piston or the ball toward the top of the column. 6. Hold your breath for 3-5 seconds or for as long as possible. Allow the piston or ball to fall to the bottom of the column. 7. Remove the mouthpiece from your mouth and breathe out normally. 8. Rest for a few seconds and repeat Steps 1 through 7 at least 10 times every 1-2 hours when you are awake. Take your time and take a few normal breaths between deep breaths. 9. The spirometer may include an indicator to show your best effort. Use the indicator as a goal to work toward during each repetition. 10. After each set of 10 deep breaths, practice coughing to be sure your lungs are clear. If you have an incision (the cut made at the time of surgery),  support your incision when coughing by placing a pillow or rolled up towels firmly against it. Once you are able to get out of bed, walk around indoors and cough well. You may stop using the incentive spirometer when instructed by your caregiver.  RISKS AND COMPLICATIONS  Take your time so you do not get dizzy or light-headed.  If you are in pain, you may need to take or ask for pain medication before  doing incentive spirometry. It is harder to take a deep breath if you are having pain. AFTER USE  Rest and breathe slowly and easily.  It can be helpful to keep track of a log of your progress. Your caregiver can provide you with a simple table to help with this. If you are using the spirometer at home, follow these instructions: [place] IF:   You are having difficultly using the spirometer.  You have trouble using the spirometer as often as instructed.  Your pain medication is not giving enough relief while using the spirometer.  You develop fever of 100.5 F (38.1 C) or higher. SEEK IMMEDIATE MEDICAL CARE IF:   You cough up bloody sputum that had not been present before.  You develop fever of 102 F (38.9 C) or greater.  You develop worsening pain at or near the incision site. MAKE SURE YOU:   Understand these instructions.  Will watch your condition.  Will get help right away if you are not doing well or get worse. Document Released: 10/11/2006 Document Revised: 08/23/2011 Document Reviewed: 12/12/2006 [facility] Patient Information 2014 [place], [place].   ________________________________________________________________________

## 2020-01-02 NOTE — Progress Notes (Addendum)
PCP - Chana Bode, PA-C Cardiologist - no  PPM/ICD -  Device Orders -  Rep Notified -   Chest x-ray -  EKG - 07-09-19 epic and 01-09-20 epic Stress Test -  ECHO -  Cardiac Cath -   Sleep Study -  CPAP -   Fasting Blood Sugar -  Checks Blood Sugar _____ times a day  Blood Thinner Instructions: Aspirin Instructions:  ERAS Protcol - PRE-SURGERY Ensure  COVID TEST- 01-17-20  Activity -Able to walk a flight of stairs without SOB  Anesthesia review: HTN, abn ekg  Patient denies shortness of breath, fever, cough and chest pain at PAT appointment   NONE   All instructions explained to the patient, with a verbal understanding of the material. Patient agrees to go over the instructions while at home for a better understanding. Patient also instructed to self quarantine after being tested for COVID-19. The opportunity to ask questions was provided.

## 2020-01-09 ENCOUNTER — Other Ambulatory Visit: Payer: Self-pay

## 2020-01-09 ENCOUNTER — Telehealth: Payer: Self-pay | Admitting: Medical

## 2020-01-09 ENCOUNTER — Ambulatory Visit (HOSPITAL_COMMUNITY)
Admission: RE | Admit: 2020-01-09 | Discharge: 2020-01-09 | Disposition: A | Discharge status: Home / Self Care | Payer: BC Managed Care – PPO | Source: Ambulatory Visit | Attending: Orthopedic Surgery | Admitting: Orthopedic Surgery

## 2020-01-09 ENCOUNTER — Encounter (HOSPITAL_COMMUNITY)
Admission: RE | Admit: 2020-01-09 | Discharge: 2020-01-09 | Disposition: A | Discharge status: Home / Self Care | Payer: BC Managed Care – PPO | Source: Ambulatory Visit | Attending: Orthopedic Surgery | Admitting: Orthopedic Surgery

## 2020-01-09 ENCOUNTER — Encounter (HOSPITAL_COMMUNITY): Payer: Self-pay

## 2020-01-09 DIAGNOSIS — Z01818 Encounter for other preprocedural examination: Secondary | ICD-10-CM | POA: Insufficient documentation

## 2020-01-09 LAB — CBC WITH DIFFERENTIAL/PLATELET
Abs Immature Granulocytes: 0.01 10*3/uL (ref 0.00–0.07)
Basophils Absolute: 0 10*3/uL (ref 0.0–0.1)
Basophils Relative: 1 %
Eosinophils Absolute: 0.2 10*3/uL (ref 0.0–0.5)
Eosinophils Relative: 5 %
HCT: 49.2 % (ref 39.0–52.0)
Hemoglobin: 15.2 g/dL (ref 13.0–17.0)
Immature Granulocytes: 0 %
Lymphocytes Relative: 43 %
Lymphs Abs: 1.9 10*3/uL (ref 0.7–4.0)
MCH: 29.1 pg (ref 26.0–34.0)
MCHC: 30.9 g/dL (ref 30.0–36.0)
MCV: 94.1 fL (ref 80.0–100.0)
Monocytes Absolute: 0.3 10*3/uL (ref 0.1–1.0)
Monocytes Relative: 8 %
Neutro Abs: 1.8 10*3/uL (ref 1.7–7.7)
Neutrophils Relative %: 43 %
Platelets: 196 10*3/uL (ref 150–400)
RBC: 5.23 MIL/uL (ref 4.22–5.81)
RDW: 14 % (ref 11.5–15.5)
WBC: 4.3 10*3/uL (ref 4.0–10.5)
nRBC: 0 % (ref 0.0–0.2)

## 2020-01-09 LAB — URINALYSIS, ROUTINE W REFLEX MICROSCOPIC
Bilirubin Urine: NEGATIVE
Glucose, UA: NEGATIVE mg/dL
Hgb urine dipstick: NEGATIVE
Ketones, ur: NEGATIVE mg/dL
Nitrite: NEGATIVE
Protein, ur: NEGATIVE mg/dL
Specific Gravity, Urine: 1.018 (ref 1.005–1.030)
pH: 5 (ref 5.0–8.0)

## 2020-01-09 LAB — SURGICAL PCR SCREEN
MRSA, PCR: NEGATIVE
Staphylococcus aureus: NEGATIVE

## 2020-01-09 LAB — APTT: aPTT: 31 seconds (ref 24–36)

## 2020-01-09 LAB — TYPE AND SCREEN
ABO/RH(D): O POS
Antibody Screen: NEGATIVE

## 2020-01-09 LAB — BASIC METABOLIC PANEL
Anion gap: 9 (ref 5–15)
BUN: 13 mg/dL (ref 8–23)
CO2: 27 mmol/L (ref 22–32)
Calcium: 9.7 mg/dL (ref 8.9–10.3)
Chloride: 104 mmol/L (ref 98–111)
Creatinine, Ser: 1.4 mg/dL — ABNORMAL HIGH (ref 0.61–1.24)
GFR calc Af Amer: >60 mL/min (ref >60)
GFR calc non Af Amer: 53 mL/min — ABNORMAL LOW (ref >60)
Glucose, Bld: 120 mg/dL — ABNORMAL HIGH (ref 70–99)
Potassium: 4 mmol/L (ref 3.5–5.1)
Sodium: 140 mmol/L (ref 135–145)

## 2020-01-09 LAB — PROTIME-INR
INR: 0.9 (ref 0.8–1.2)
Prothrombin Time: 11.8 seconds (ref 11.4–15.2)

## 2020-01-09 NOTE — Telephone Encounter (Signed)
    Wesley Lawson,   We have a preoperative cardiac clearance pool that typically manages surgical requests and per their guidelines, they require an office visit within 6 months to provide clearance. In looking at his note from 07/2016, he did not have a history of CAD or CHF. If you do not feel comfortable clearing him and wish for him to be evaluated by Cardiology prior to doing so, then he will need to contact the office to arrange an appointment [phone]). I am no longer at that office as I work in [facility] now but we have several APP's there that could see him if Dr.Kelly does not have availability.   [place],  [place]

## 2020-01-09 NOTE — Telephone Encounter (Signed)
[place]  I hope you are doing well.   Wesley Lawson is having surgery soon for hip surgery, spinal anesthesia.    Based on his 2018 Myoview and your last visit, would you feel comfortable clearing him for surgery, or do you need to see him first?

## 2020-01-09 NOTE — Telephone Encounter (Signed)
Please schedule him for preop visit.  He is having hip surgery soon.  I also sent a message to cardiology making sure they are okay clearing him for surgery as well.  He had a 2018 stress test, prior abnormal EKG even before this test,.  I reviewed labs he had done today.  His blood sugar was elevated but not sure if he was fasting.  The rest of labs look okay, kidney marker abnormal but stable.

## 2020-01-10 NOTE — Telephone Encounter (Signed)
Called pt. LM for call back to get scheduled for a preop apt.

## 2020-01-11 ENCOUNTER — Other Ambulatory Visit: Payer: Self-pay | Admitting: Medical

## 2020-01-14 ENCOUNTER — Encounter: Payer: Self-pay | Admitting: Medical

## 2020-01-14 ENCOUNTER — Ambulatory Visit (INDEPENDENT_AMBULATORY_CARE_PROVIDER_SITE_OTHER): Payer: BC Managed Care – PPO | Admitting: Medical

## 2020-01-14 VITALS — BP 134/84 | HR 65 | Ht 70.0 in | Wt 238.0 lb

## 2020-01-14 DIAGNOSIS — I10 Essential (primary) hypertension: Secondary | ICD-10-CM | POA: Diagnosis not present

## 2020-01-14 DIAGNOSIS — B001 Herpesviral vesicular dermatitis: Secondary | ICD-10-CM

## 2020-01-14 DIAGNOSIS — E669 Obesity, unspecified: Secondary | ICD-10-CM

## 2020-01-14 DIAGNOSIS — R9431 Abnormal electrocardiogram [ECG] [EKG]: Secondary | ICD-10-CM

## 2020-01-14 DIAGNOSIS — Z01818 Encounter for other preprocedural examination: Secondary | ICD-10-CM | POA: Diagnosis not present

## 2020-01-14 DIAGNOSIS — E785 Hyperlipidemia, unspecified: Secondary | ICD-10-CM

## 2020-01-14 DIAGNOSIS — M1611 Unilateral primary osteoarthritis, right hip: Secondary | ICD-10-CM

## 2020-01-14 DIAGNOSIS — N529 Male erectile dysfunction, unspecified: Secondary | ICD-10-CM

## 2020-01-14 MED ORDER — SILDENAFIL CITRATE 20 MG PO TABS: 20.0000 mg | ORAL_TABLET | Freq: Every day | ORAL | 2 refills | Status: DC | PRN

## 2020-01-14 MED ORDER — PRAVASTATIN SODIUM 20 MG PO TABS: 20.0000 mg | ORAL_TABLET | Freq: Every evening | ORAL | 3 refills | Status: DC

## 2020-01-14 MED ORDER — VALACYCLOVIR HCL 1 G PO TABS: ORAL_TABLET | ORAL | 1 refills | Status: DC

## 2020-01-14 MED ORDER — LISINOPRIL-HYDROCHLOROTHIAZIDE 20-25 MG PO TABS: 1.0000 | ORAL_TABLET | Freq: Every day | ORAL | 1 refills | Status: DC

## 2020-01-14 NOTE — Progress Notes (Addendum)
Subjective:  Wesley Lawson Lawson is a 64 y.o. male who presents for Chief Complaint  Patient presents with  . Pre-op Exam  . Medication Refill    wants refill on sidenafil and valtrex      Here for preop.  Wesley Lawson Lawson is a 64 year old African-American male with history of hypertension, hyperlipidemia, obesity, arthritis, here for surgery preop.  He had labs recently on 01/09/20 in preparation for his upcoming surgery . Having right hip replacement.   Was put on antibiotic for abnormal urinalysis showing possible UTI.   Was put on this last week.  He had some urinary frequency but no other urinary symptoms  Felt a little feverish last week.  Is feeling improved.  No concern for STD.  Was advised last week to stop aspirin temporarily regarding surgery.  Saw hospital for preop counseling last week.  Has other concerns.  Fees like he has cold sore starting.  Wants refill on Valtrex.   Needs refill on Sildenafil.   Urology has been refilling, but it requires appt and higher copy just to go in to see urology for this. Wants me to refill it today.  This works fine for him.   Compliant with BP and cholesterol medicaiton.   No particular c/o or side effect.  Was on albuterol months ago with covid infection.   No problems now, not using albuterol.  No other aggravating or relieving factors.    No other c/o.  Past Medical History:  Diagnosis Date  . Abnormal EKG   . Allergy   . Arthritis    hands, knees  . Chest pain    a. normal echo stress test in 2010 with mild to moderate LVH.   Marland Kitchen Dyslipidemia   . History of exercise stress test 08/2016   normal  . Hyperlipidemia   . Hypertension    Current Outpatient Medications on File Prior to Visit  Medication Sig Dispense Refill  . acetaminophen (TYLENOL) 325 MG tablet Take 650 mg by mouth every 6 (six) hours as needed for moderate pain.     Marland Kitchen aspirin EC 81 MG tablet Take 1 tablet (81 mg total) by mouth daily. (Patient not taking: Reported on 01/14/2020)  90 tablet 3   Current Facility-Administered Medications on File Prior to Visit  Medication Dose Route Frequency Provider Last Rate Last Admin  . 0.9 %  sodium chloride infusion  500 mL Intravenous Once Wesley Stabler, MD         The following portions of the patient's history were reviewed and updated as appropriate: allergies, current medications, past family history, past medical history, past social history, past surgical history and problem list.  ROS Otherwise as in subjective above    Objective: BP (!) 134/84   Pulse 65   Ht 5\' 10"  (1.778 m)   Wt (!) 238 lb (108 kg)   SpO2 98%   BMI 34.15 kg/m   Wt Readings from Last 3 Encounters:  01/14/20 (!) 238 lb (108 kg)  01/09/20 (!) 241 lb 8 oz (109.5 kg)  11/21/19 232 lb (105.2 kg)   BP Readings from Last 3 Encounters:  01/14/20 (!) 134/84  01/09/20 (!) 156/86  11/25/19 (!) 154/80    General appearance: alert, no distress, well developed, well nourished HEENT: normocephalic, sclerae anicteric, conjunctiva pink and moist, TMs pearly, nares patent, no discharge or erythema, pharynx normal Oral cavity: MMM, no lesions Neck: supple, no lymphadenopathy, no thyromegaly, no masses, no bruits Heart: RRR, normal S1,  S2, no murmurs Lungs: CTA bilaterally, no wheezes, rhonchi, or rales Abdomen: +bs, soft, non tender, non distended, no masses, no hepatomegaly, no splenomegaly Pulses: 2+ radial pulses, 2+ pedal pulses, normal cap refill Ext: no edema   Assessment: Encounter Diagnoses  Name Primary?  . Preop examination Yes  . Hyperlipidemia, unspecified hyperlipidemia type   . Essential hypertension   . Cold sore   . Primary osteoarthritis of right hip   . Obesity with serious comorbidity, unspecified classification, unspecified obesity type   . Erectile dysfunction, unspecified erectile dysfunction type   . Abnormal EKG      Plan: I reviewed recent labs he had done on 01/01/2020.  Blood count was normal CBC.  PT, PTT  normal.  Basic metabolic shows glucose 583 fasting, creatinine 1.40 elevated.  Last hemoglobin A1c was 5.6% done on May 17, 2019.  His creatinine markers have been in the 1.35 range for the last year.  I reviewed back over his March 2018 myocardial perfusion study in the chart record, and notation from guarding showed normal stress test, no evidence of ischemia, normal LV function, low risk study.  His recent EKG is no different from the abnormal EKG 2018 prior to last stress test, so no acute changes  Abnormal creatine in past 12 months since covid infection.   Concern for long term effects of hypertension vs covid insult.   After upcoming hip surgery, we will plan to follow up and repeat BMET.  Consider changes to Olmesartan HCT with lower HCT dose.   Since surgery is 1 week ago, no med changes today for stability.  I will go ahead and clear him for surgery.  I asked him to follow-up in 1 to 2 months after surgery to recheck creatinine and blood pressure.  We may end up changing to lower dose HCT such as Benicar HCT with the HCT at 12.5 mg.  In the meantime I advised him to avoid NSAIDs, hydrate well at least 80 - 100 ounces per day.  Wesley Lawson Lawson was seen today for pre-op exam and medication refill.  Diagnoses and all orders for this visit:  Preop examination  Hyperlipidemia, unspecified hyperlipidemia type Comments: out of meds, past due for f/u. D/W Audelia Acton, restart pravastatin and set up f/u (med check/CPE) Orders: -     pravastatin (PRAVACHOL) 20 MG tablet; Take 1 tablet (20 mg total) by mouth every evening.  Essential hypertension  Cold sore  Primary osteoarthritis of right hip  Obesity with serious comorbidity, unspecified classification, unspecified obesity type  Erectile dysfunction, unspecified erectile dysfunction type  Abnormal EKG  Other orders -     valACYclovir (VALTREX) 1000 MG tablet; TAKE 2 TABLETS BY MOUTH TWICE DAILY FOR 1 DAY FOR COLD SORE OR THREE TIMES DAILY  FOR 1 WEEK FOR SHINGLES -     sildenafil (REVATIO) 20 MG tablet; Take 1-5 tablets (20-100 mg total) by mouth daily as needed (ED). -     lisinopril-hydrochlorothiazide (ZESTORETIC) 20-25 MG tablet; Take 1 tablet by mouth daily.    Follow up: 1-2 months on BP, creatinine.

## 2020-01-14 NOTE — Anesthesia Preprocedure Evaluation (Addendum)
Anesthesia Evaluation  Patient identified by MRN, date of birth, ID band Patient awake    Reviewed: Allergy & Precautions, NPO status , Patient's Chart, lab work & pertinent test results  Airway Mallampati: II  TM Distance: >3 FB Neck ROM: Full    Dental no notable dental hx.    Pulmonary neg pulmonary ROS,    Pulmonary exam normal breath sounds clear to auscultation       Cardiovascular hypertension, Pt. on medications negative cardio ROS Normal cardiovascular exam Rhythm:Regular Rate:Normal     Neuro/Psych negative neurological ROS  negative psych ROS   GI/Hepatic negative GI ROS, Neg liver ROS,   Endo/Other  negative endocrine ROS  Renal/GU negative Renal ROS  negative genitourinary   Musculoskeletal negative musculoskeletal ROS (+) Arthritis , Osteoarthritis,    Abdominal (+) + obese,   Peds negative pediatric ROS (+)  Hematology negative hematology ROS (+)   Anesthesia Other Findings   Reproductive/Obstetrics negative OB ROS                            Anesthesia Physical Anesthesia Plan  ASA: II  Anesthesia Plan: Spinal   Post-op Pain Management:    Induction: Intravenous  PONV Risk Score and Plan: 1 and Ondansetron and Treatment may vary due to age or medical condition  Airway Management Planned: Simple Face Mask  Additional Equipment:   Intra-op Plan:   Post-operative Plan:   Informed Consent: I have reviewed the patients History and Physical, chart, labs and discussed the procedure including the risks, benefits and alternatives for the proposed anesthesia with the patient or authorized representative who has indicated his/her understanding and acceptance.     Dental advisory given  Plan Discussed with: CRNA  Anesthesia Plan Comments: (See PAT note 01/09/2020, Konrad Felix, PA-C)       Anesthesia Quick Evaluation

## 2020-01-14 NOTE — Progress Notes (Signed)
Anesthesia Chart Review   Case: [id number] Date/Time: 01/21/20 0700   Procedure: RIGHT TOTAL HIP ARTHROPLASTY ANTERIOR APPROACH (Right Hip)   Anesthesia type: Spinal   Pre-op diagnosis: RIGHT HIP OSTEOARTHRITIS   Location: [place] [facility] / [facility]   Surgeons: Frederik Pear, MD      DISCUSSION:64 y.o. never smoker with h/o HTN, HLD, right hip OA scheduled for above procedure 01/21/2020 with Dr. Frederik Pear.   Pt seen by PCP 01/14/2020 for preoperative evaluation.  Per OV note, "I reviewed recent labs he had done on 01/01/2020.  Blood count was normal CBC.  PT, PTT normal.  Basic metabolic shows glucose 081 fasting, creatinine 1.40 elevated.  Last hemoglobin A1c was 5.6% done on May 17, 2019.  His creatinine markers have been in the 1.35 range for the last year. I reviewed back over his March 2018 myocardial perfusion study in the chart record, and notation from guarding showed normal stress test, no evidence of ischemia, normal LV function, low risk study.  His recent EKG is no different from the abnormal EKG 2018 prior to last stress test, so no acute changes Abnormal creatine in past 12 months since covid infection.   Concern for long term effects of hypertension vs covid insult.   After upcoming hip surgery, we will plan to follow up and repeat BMET.  Consider changes to Olmesartan HCT with lower HCT dose.   Since surgery is 1 week ago, no med changes today for stability. I will go ahead and clear him for surgery."  Anticipate pt can proceed with planned procedure barring acute status change.   VS: BP (!) 156/86   Pulse 67   Temp 36.7 C (Oral)   Resp 18   Ht 5\' 10"  (1.778 m)   Wt (!) 109.5 kg   SpO2 100%   BMI 34.65 kg/m   PROVIDERS: Tysinger, Camelia Eng, PA-C is PCP    LABS: Labs reviewed: Acceptable for surgery. (all labs ordered are listed, but only abnormal results are displayed)  Labs Reviewed  BASIC METABOLIC PANEL - Abnormal; Notable for the following components:      Result Value    Glucose, Bld 120 (*)    Creatinine, Ser 1.40 (*)    GFR calc non Af Amer 53 (*)    All other components within normal limits  URINALYSIS, ROUTINE W REFLEX MICROSCOPIC - Abnormal; Notable for the following components:   Leukocytes,Ua TRACE (*)    Bacteria, UA RARE (*)    All other components within normal limits  SURGICAL PCR SCREEN  APTT  CBC WITH DIFFERENTIAL/PLATELET  PROTIME-INR  TYPE AND SCREEN     IMAGES:   EKG: 01/09/2020 Rate 67 bpm  Normal sinus rhythm Lateral infarct , age undetermined Lateral TWI Abnormal ECG HEART RATE DECREASED SINCE previous  CV: Myocardial Perfusion 08/13/2016  The left ventricular ejection fraction is normal (55-65%).  Nuclear stress EF: 57%.  Blood pressure demonstrated a hypertensive response to exercise.  There was no ST segment deviation noted during stress.  The study is normal.  This is a low risk study.   Normal stress nuclear study with no ischemia or infarction; EF 57 with normal wall motion.  Past Medical History:  Diagnosis Date  . Abnormal EKG   . Allergy   . Arthritis    hands, knees  . Chest pain    a. normal echo stress test in 2010 with mild to moderate LVH.   Marland Kitchen Dyslipidemia   . History of exercise  stress test 08/2016   normal  . Hyperlipidemia   . Hypertension     Past Surgical History:  Procedure Laterality Date  . COLONOSCOPY  07/21/2017   tubular adenoma, Dr. Wilfrid Lund  . NO PAST SURGERIES    . WISDOM TOOTH EXTRACTION      MEDICATIONS: . acetaminophen (TYLENOL) 325 MG tablet  . aspirin EC 81 MG tablet  . lisinopril-hydrochlorothiazide (ZESTORETIC) 20-25 MG tablet  . pravastatin (PRAVACHOL) 20 MG tablet  . sildenafil (REVATIO) 20 MG tablet  . valACYclovir (VALTREX) 1000 MG tablet   . 0.9 %  sodium chloride infusion    Konrad Felix, PA-C [facility] Pre-Surgical Testing [phone]

## 2020-01-17 ENCOUNTER — Other Ambulatory Visit (HOSPITAL_COMMUNITY)
Admission: RE | Admit: 2020-01-17 | Discharge: 2020-01-17 | Disposition: A | Discharge status: Home / Self Care | Payer: BC Managed Care – PPO | Source: Ambulatory Visit | Attending: Orthopedic Surgery | Admitting: Orthopedic Surgery

## 2020-01-17 DIAGNOSIS — Z01812 Encounter for preprocedural laboratory examination: Secondary | ICD-10-CM | POA: Diagnosis not present

## 2020-01-17 DIAGNOSIS — Z20822 Contact with and (suspected) exposure to covid-19: Secondary | ICD-10-CM | POA: Insufficient documentation

## 2020-01-17 LAB — SARS CORONAVIRUS 2 (TAT 6-24 HRS): SARS Coronavirus 2: NEGATIVE

## 2020-01-18 NOTE — H&P (Signed)
TOTAL HIP ADMISSION H&P  Patient is admitted for right total hip arthroplasty.  Subjective:  Chief Complaint: right hip pain  HPI: Wesley Lawson, 64 y.o. male, has a history of pain and functional disability in the right hip(s) due to arthritis and patient has failed non-surgical conservative treatments for greater than 12 weeks to include NSAID's and/or analgesics, supervised PT with diminished ADL's post treatment and activity modification.  Onset of symptoms was gradual starting 3 years ago with gradually worsening course since that time.The patient noted no past surgery on the right hip(s).  Patient currently rates pain in the right hip at 10 out of 10 with activity. Patient has night pain, worsening of pain with activity and weight bearing, trendelenberg gait, pain that interfers with activities of daily living and pain with passive range of motion. Patient has evidence of joint space narrowing by imaging studies. This condition presents safety issues increasing the risk of falls.  There is no current active infection.  Patient Active Problem List   Diagnosis Date Noted  . Osteoarthritis of right hip 07/23/2019  . Need for pneumococcal vaccination 05/17/2019  . Benign prostatic hyperplasia with lower urinary tract symptoms 05/15/2019  . Low testosterone 05/15/2019  . Lipoma 04/11/2019  . Tubular adenoma 11/02/2017  . Screen for colon cancer 06/13/2017  . Screening for prostate cancer 06/13/2017  . Screening for diabetes mellitus 06/13/2017  . Herpes zoster without complication 86/57/8469  . Cold sore 10/21/2016  . Hyperlipidemia 07/30/2016  . Erectile dysfunction 07/30/2016  . 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 Primary osteoarthritis of left hip 07/30/2016  . Abnormal EKG 07/30/2016  . Obesity 10/13/2015  . Essential hypertension 10/13/2015  . Decreased range of hip movement 10/13/2015   Past Medical History:  Diagnosis Date  . Abnormal EKG   . Allergy   . Arthritis     hands, knees  . Chest pain    a. normal echo stress test in 2010 with mild to moderate LVH.   Marland Kitchen Dyslipidemia   . History of exercise stress test 08/2016   normal  . Hyperlipidemia   . Hypertension     Past Surgical History:  Procedure Laterality Date  . COLONOSCOPY  07/21/2017   tubular adenoma, Dr. Wilfrid Lund  . NO PAST SURGERIES    . WISDOM TOOTH EXTRACTION      Current Facility-Administered Medications  Medication Dose Route Frequency Provider Last Rate Last Admin  . 0.9 %  sodium chloride infusion  500 mL Intravenous Once Doran Stabler, MD       Current Outpatient Medications  Medication Sig Dispense Refill Last Dose  . acetaminophen (TYLENOL) 325 MG tablet Take 650 mg by mouth every 6 (six) hours as needed for moderate pain.      Marland Kitchen aspirin EC 81 MG tablet Take 1 tablet (81 mg total) by mouth daily. (Patient not taking: Reported on 01/14/2020) 90 tablet 3   . lisinopril-hydrochlorothiazide (ZESTORETIC) 20-25 MG tablet Take 1 tablet by mouth daily. 90 tablet 1   . pravastatin (PRAVACHOL) 20 MG tablet Take 1 tablet (20 mg total) by mouth every evening. 90 tablet 3   . sildenafil (REVATIO) 20 MG tablet Take 1-5 tablets (20-100 mg total) by mouth daily as needed (ED). 50 tablet 2   . valACYclovir (VALTREX) 1000 MG tablet TAKE 2 TABLETS BY MOUTH TWICE DAILY FOR 1 DAY FOR COLD SORE OR THREE TIMES DAILY FOR 1 WEEK FOR SHINGLES 30 tablet 1    No  Known Allergies  Social History   Tobacco Use  . Smoking status: Never Smoker  . Smokeless tobacco: Never Used  Substance Use Topics  . Alcohol use: Yes    Alcohol/week: 3.0 standard drinks    Types: 3 Cans of beer per week    Comment: once a week    Family History  Problem Relation Age of Onset  . Diabetes Mother   . Stroke Mother   . Diabetes Sister   . Asthma Daughter   . Colon polyps Neg Hx   . Esophageal cancer Neg Hx   . Rectal cancer Neg Hx   . Stomach cancer Neg Hx   . Cancer Neg Hx   . Heart disease Neg Hx   .  Colon cancer Neg Hx   . Liver cancer Neg Hx   . Pancreatic cancer Neg Hx      Review of Systems  Constitutional: Negative.   HENT: Positive for sinus pain.   Eyes: Negative.   Respiratory: Negative.   Cardiovascular: Negative.   Gastrointestinal: Negative.   Endocrine: Negative.   Genitourinary: Negative.   Musculoskeletal: Positive for arthralgias.  Allergic/Immunologic: Negative.   Neurological: Negative.   Hematological: Negative.   Psychiatric/Behavioral: Negative.     Objective:  Physical Exam Constitutional:      Appearance: Normal appearance. He is obese.  HENT:     Head: Normocephalic and atraumatic.     Nose: Nose normal.  Eyes:     Pupils: Pupils are equal, round, and reactive to light.  Cardiovascular:     Pulses: Normal pulses.  Pulmonary:     Effort: Pulmonary effort is normal.  Musculoskeletal:     Cervical back: Normal range of motion and neck supple.     Comments: the patient has significant stiffness with attempts of internal and external rotation of both point left hip.  He is able to internally rotate the right to approximately 5 and left proximally 10.  Obvious pain with these motions.  His calves are soft and nontender.  He is neurovascular intact distally.  Skin:    General: Skin is warm and dry.  Neurological:     General: No focal deficit present.     Mental Status: He is alert and oriented to person, place, and time. Mental status is at baseline.  Psychiatric:        Mood and Affect: Mood normal.        Behavior: Behavior normal.        Thought Content: Thought content normal.        Judgment: Judgment normal.     Vital signs in last 24 hours:    Labs:   Estimated body mass index is 34.15 kg/m as calculated from the following:   Height as of 01/14/20: 5\' 10"  (1.778 m).   Weight as of 01/14/20: 108 kg.   Imaging Review Plain radiographs demonstrate bone-on-bone arthritis of the right greater than left  hip.   Assessment/Plan:  End stage arthritis, right hip(s)  The patient history, physical examination, clinical judgement of the provider and imaging studies are consistent with end stage degenerative joint disease of the right hip(s) and total hip arthroplasty is deemed medically necessary. The treatment options including medical management, injection therapy, arthroscopy and arthroplasty were discussed at length. The risks and benefits of total hip arthroplasty were presented and reviewed. The risks due to aseptic loosening, infection, stiffness, dislocation/subluxation,  thromboembolic complications and other imponderables were discussed.  The patient acknowledged the explanation, agreed  to proceed with the plan and consent was signed. Patient is being admitted for inpatient treatment for surgery, pain control, PT, OT, prophylactic antibiotics, VTE prophylaxis, progressive ambulation and ADL's and discharge planning.The patient is planning to be discharged home with home health services    Patient's anticipated LOS is less than 2 midnights, meeting these requirements: - Younger than 47 - Lives within 1 hour of care - Has a competent adult at home to recover with post-op recover - NO history of  - Chronic pain requiring opiods  - Diabetes  - Coronary Artery Disease  - Heart failure  - Heart attack  - Stroke  - DVT/VTE  - Cardiac arrhythmia  - Respiratory Failure/COPD  - Renal failure  - Anemia  - Advanced Liver disease

## 2020-01-20 MED ORDER — TRANEXAMIC ACID 1000 MG/10ML IV SOLN
2000.0000 mg | INTRAVENOUS | Status: DC
  Filled 2020-01-20: qty 20

## 2020-01-20 MED ORDER — BUPIVACAINE LIPOSOME 1.3 % IJ SUSP
10.0000 mL | Freq: Once | INTRAMUSCULAR | Status: DC
  Filled 2020-01-20: qty 10

## 2020-01-21 ENCOUNTER — Ambulatory Visit (HOSPITAL_COMMUNITY): Payer: BC Managed Care – PPO

## 2020-01-21 ENCOUNTER — Ambulatory Visit (HOSPITAL_COMMUNITY)
Admission: RE | Admit: 2020-01-21 | Discharge: 2020-01-21 | Disposition: A | Discharge status: Home / Self Care | Payer: BC Managed Care – PPO | Attending: Orthopedic Surgery | Admitting: Orthopedic Surgery

## 2020-01-21 ENCOUNTER — Encounter (HOSPITAL_COMMUNITY)
Admission: RE | Disposition: A | Discharge status: Home / Self Care | Payer: Self-pay | Source: Home / Self Care | Attending: Orthopedic Surgery

## 2020-01-21 ENCOUNTER — Ambulatory Visit (HOSPITAL_COMMUNITY): Payer: BC Managed Care – PPO | Admitting: Anesthesiology

## 2020-01-21 ENCOUNTER — Ambulatory Visit (HOSPITAL_COMMUNITY): Payer: BC Managed Care – PPO | Admitting: Physician Assistant

## 2020-01-21 DIAGNOSIS — E785 Hyperlipidemia, unspecified: Secondary | ICD-10-CM | POA: Insufficient documentation

## 2020-01-21 DIAGNOSIS — Z6834 Body mass index (BMI) 34.0-34.9, adult: Secondary | ICD-10-CM | POA: Diagnosis not present

## 2020-01-21 DIAGNOSIS — R338 Other retention of urine: Secondary | ICD-10-CM | POA: Diagnosis not present

## 2020-01-21 DIAGNOSIS — M160 Bilateral primary osteoarthritis of hip: Secondary | ICD-10-CM | POA: Insufficient documentation

## 2020-01-21 DIAGNOSIS — Y658 Other specified misadventures during surgical and medical care: Secondary | ICD-10-CM | POA: Insufficient documentation

## 2020-01-21 DIAGNOSIS — Z79899 Other long term (current) drug therapy: Secondary | ICD-10-CM | POA: Insufficient documentation

## 2020-01-21 DIAGNOSIS — E669 Obesity, unspecified: Secondary | ICD-10-CM | POA: Insufficient documentation

## 2020-01-21 DIAGNOSIS — N9971 Accidental puncture and laceration of a genitourinary system organ or structure during a genitourinary system procedure: Secondary | ICD-10-CM | POA: Insufficient documentation

## 2020-01-21 DIAGNOSIS — I10 Essential (primary) hypertension: Secondary | ICD-10-CM | POA: Diagnosis not present

## 2020-01-21 DIAGNOSIS — N529 Male erectile dysfunction, unspecified: Secondary | ICD-10-CM | POA: Diagnosis not present

## 2020-01-21 DIAGNOSIS — N401 Enlarged prostate with lower urinary tract symptoms: Secondary | ICD-10-CM | POA: Insufficient documentation

## 2020-01-21 DIAGNOSIS — Z419 Encounter for procedure for purposes other than remedying health state, unspecified: Secondary | ICD-10-CM

## 2020-01-21 DIAGNOSIS — M1611 Unilateral primary osteoarthritis, right hip: Secondary | ICD-10-CM | POA: Diagnosis present

## 2020-01-21 HISTORY — PX: TOTAL HIP ARTHROPLASTY: SHX124

## 2020-01-21 LAB — ABO/RH: ABO/RH(D): O POS

## 2020-01-21 SURGERY — ARTHROPLASTY, HIP, TOTAL, ANTERIOR APPROACH
Anesthesia: Spinal | Site: Hip | Laterality: Right

## 2020-01-21 MED ORDER — ORAL CARE MOUTH RINSE: 15.0000 mL | Freq: Once | OROMUCOSAL | Status: AC

## 2020-01-21 MED ORDER — 0.9 % SODIUM CHLORIDE (POUR BTL) OPTIME
TOPICAL | Status: DC | PRN
  Administered 2020-01-21: 1000 mL

## 2020-01-21 MED ORDER — OXYCODONE-ACETAMINOPHEN 5-325 MG PO TABS: 1.0000 | ORAL_TABLET | ORAL | 0 refills | Status: DC | PRN

## 2020-01-21 MED ORDER — TRANEXAMIC ACID-NACL 1000-0.7 MG/100ML-% IV SOLN
1000.0000 mg | INTRAVENOUS | Status: AC
  Administered 2020-01-21: 1000 mg via INTRAVENOUS
  Filled 2020-01-21: qty 100

## 2020-01-21 MED ORDER — TIZANIDINE HCL 2 MG PO TABS
2.0000 mg | ORAL_TABLET | Freq: Four times a day (QID) | ORAL | 0 refills | Status: DC | PRN
Start: 2020-01-21 — End: 2021-02-17

## 2020-01-21 MED ORDER — OXYCODONE HCL 5 MG PO TABS
ORAL_TABLET | ORAL | Status: AC
  Administered 2020-01-21: 5 mg via ORAL
  Filled 2020-01-21: qty 1

## 2020-01-21 MED ORDER — DEXAMETHASONE SODIUM PHOSPHATE 10 MG/ML IJ SOLN
INTRAMUSCULAR | Status: DC | PRN
Start: 2020-01-21 — End: 2020-01-21
  Administered 2020-01-21: 8 mg via INTRAVENOUS

## 2020-01-21 MED ORDER — EPHEDRINE SULFATE 50 MG/ML IJ SOLN
INTRAMUSCULAR | Status: DC | PRN
Start: 2020-01-21 — End: 2020-01-21
  Administered 2020-01-21 (×2): 5 mg via INTRAVENOUS

## 2020-01-21 MED ORDER — MEPERIDINE HCL 50 MG/ML IJ SOLN: 6.2500 mg | INTRAMUSCULAR | Status: DC | PRN

## 2020-01-21 MED ORDER — ASPIRIN EC 81 MG PO TBEC
81.0000 mg | DELAYED_RELEASE_TABLET | Freq: Two times a day (BID) | ORAL | 0 refills | Status: DC
Start: 2020-01-21 — End: 2021-02-18

## 2020-01-21 MED ORDER — LACTATED RINGERS IV SOLN: INTRAVENOUS | Status: DC

## 2020-01-21 MED ORDER — OXYCODONE HCL 5 MG PO TABS
5.0000 mg | ORAL_TABLET | Freq: Once | ORAL | Status: AC | PRN
  Administered 2020-01-21: 5 mg via ORAL

## 2020-01-21 MED ORDER — PHENYLEPHRINE HCL-NACL 10-0.9 MG/250ML-% IV SOLN
INTRAVENOUS | Status: DC | PRN
  Administered 2020-01-21: 25 ug/min via INTRAVENOUS

## 2020-01-21 MED ORDER — POVIDONE-IODINE 10 % EX SWAB
2.0000 "application " | Freq: Once | CUTANEOUS | Status: AC
  Administered 2020-01-21: 2 via TOPICAL

## 2020-01-21 MED ORDER — PHENYLEPHRINE HCL (PRESSORS) 10 MG/ML IV SOLN
INTRAVENOUS | Status: AC
  Filled 2020-01-21: qty 1

## 2020-01-21 MED ORDER — PROPOFOL 10 MG/ML IV BOLUS
INTRAVENOUS | Status: AC
  Filled 2020-01-21: qty 20

## 2020-01-21 MED ORDER — CHLORHEXIDINE GLUCONATE 0.12 % MT SOLN
15.0000 mL | Freq: Once | OROMUCOSAL | Status: AC
  Administered 2020-01-21: 15 mL via OROMUCOSAL

## 2020-01-21 MED ORDER — SODIUM CHLORIDE (PF) 0.9 % IJ SOLN
INTRAMUSCULAR | Status: AC
  Filled 2020-01-21: qty 50

## 2020-01-21 MED ORDER — LACTATED RINGERS IV BOLUS
250.0000 mL | Freq: Once | INTRAVENOUS | Status: AC
  Administered 2020-01-21: 250 mL via INTRAVENOUS

## 2020-01-21 MED ORDER — OXYCODONE HCL 5 MG/5ML PO SOLN: 5.0000 mg | Freq: Once | ORAL | Status: AC | PRN

## 2020-01-21 MED ORDER — BUPIVACAINE IN DEXTROSE 0.75-8.25 % IT SOLN
INTRATHECAL | Status: DC | PRN
Start: 2020-01-21 — End: 2020-01-21
  Administered 2020-01-21: 2 mL via INTRATHECAL

## 2020-01-21 MED ORDER — OXYCODONE HCL 5 MG PO TABS
ORAL_TABLET | ORAL | Status: AC
  Filled 2020-01-21: qty 1

## 2020-01-21 MED ORDER — ONDANSETRON HCL 4 MG/2ML IJ SOLN
INTRAMUSCULAR | Status: DC | PRN
  Administered 2020-01-21: 4 mg via INTRAVENOUS

## 2020-01-21 MED ORDER — LIDOCAINE HCL (CARDIAC) PF 100 MG/5ML IV SOSY
PREFILLED_SYRINGE | INTRAVENOUS | Status: DC | PRN
  Administered 2020-01-21: 30 mg via INTRAVENOUS

## 2020-01-21 MED ORDER — FENTANYL CITRATE (PF) 100 MCG/2ML IJ SOLN
INTRAMUSCULAR | Status: DC | PRN
  Administered 2020-01-21: 100 ug via INTRAVENOUS

## 2020-01-21 MED ORDER — FENTANYL CITRATE (PF) 100 MCG/2ML IJ SOLN
INTRAMUSCULAR | Status: AC
  Filled 2020-01-21: qty 2

## 2020-01-21 MED ORDER — PROPOFOL 500 MG/50ML IV EMUL
INTRAVENOUS | Status: AC
  Filled 2020-01-21: qty 50

## 2020-01-21 MED ORDER — CEPHALEXIN 500 MG PO CAPS
500.0000 mg | ORAL_CAPSULE | Freq: Two times a day (BID) | ORAL | 0 refills | Status: AC
Start: 2020-01-21 — End: 2020-01-24

## 2020-01-21 MED ORDER — BUPIVACAINE HCL (PF) 0.25 % IJ SOLN
INTRAMUSCULAR | Status: DC | PRN
  Administered 2020-01-21: 30 mL

## 2020-01-21 MED ORDER — CEFAZOLIN SODIUM-DEXTROSE 2-4 GM/100ML-% IV SOLN
2.0000 g | INTRAVENOUS | Status: AC
  Administered 2020-01-21: 2 g via INTRAVENOUS
  Filled 2020-01-21: qty 100

## 2020-01-21 MED ORDER — HYDROMORPHONE HCL 1 MG/ML IJ SOLN: 0.2500 mg | INTRAMUSCULAR | Status: DC | PRN

## 2020-01-21 MED ORDER — TRANEXAMIC ACID-NACL 1000-0.7 MG/100ML-% IV SOLN: 1000.0000 mg | Freq: Once | INTRAVENOUS | Status: DC

## 2020-01-21 MED ORDER — MIDAZOLAM HCL 2 MG/2ML IJ SOLN
INTRAMUSCULAR | Status: AC
  Filled 2020-01-21: qty 2

## 2020-01-21 MED ORDER — PROPOFOL 500 MG/50ML IV EMUL
INTRAVENOUS | Status: DC | PRN
  Administered 2020-01-21: 50 ug/kg/min via INTRAVENOUS

## 2020-01-21 MED ORDER — PHENYLEPHRINE HCL (PRESSORS) 10 MG/ML IV SOLN
INTRAVENOUS | Status: DC | PRN
  Administered 2020-01-21: 80 ug via INTRAVENOUS
  Administered 2020-01-21 (×2): 120 ug via INTRAVENOUS

## 2020-01-21 MED ORDER — SODIUM CHLORIDE 0.9% FLUSH
INTRAVENOUS | Status: DC | PRN
  Administered 2020-01-21: 50 mL

## 2020-01-21 MED ORDER — OXYCODONE HCL 5 MG PO TABS: 5.0000 mg | ORAL_TABLET | ORAL | Status: DC | PRN

## 2020-01-21 MED ORDER — DEXAMETHASONE SODIUM PHOSPHATE 10 MG/ML IJ SOLN
INTRAMUSCULAR | Status: AC
  Filled 2020-01-21: qty 1

## 2020-01-21 MED ORDER — MIDAZOLAM HCL 5 MG/5ML IJ SOLN
INTRAMUSCULAR | Status: DC | PRN
  Administered 2020-01-21: 2 mg via INTRAVENOUS

## 2020-01-21 MED ORDER — PHENYLEPHRINE 40 MCG/ML (10ML) SYRINGE FOR IV PUSH (FOR BLOOD PRESSURE SUPPORT)
PREFILLED_SYRINGE | INTRAVENOUS | Status: AC
  Filled 2020-01-21: qty 10

## 2020-01-21 MED ORDER — ONDANSETRON HCL 4 MG/2ML IJ SOLN
INTRAMUSCULAR | Status: AC
  Filled 2020-01-21: qty 2

## 2020-01-21 MED ORDER — LACTATED RINGERS IV BOLUS
500.0000 mL | Freq: Once | INTRAVENOUS | Status: AC
  Administered 2020-01-21: 500 mL via INTRAVENOUS

## 2020-01-21 MED ORDER — MEPERIDINE HCL 50 MG/ML IJ SOLN
INTRAMUSCULAR | Status: AC
  Administered 2020-01-21: 12.5 mg via INTRAVENOUS
  Filled 2020-01-21: qty 1

## 2020-01-21 MED ORDER — PROMETHAZINE HCL 25 MG/ML IJ SOLN: 6.2500 mg | INTRAMUSCULAR | Status: DC | PRN

## 2020-01-21 MED ORDER — LIDOCAINE 2% (20 MG/ML) 5 ML SYRINGE
INTRAMUSCULAR | Status: AC
  Filled 2020-01-21: qty 5

## 2020-01-21 MED ORDER — BUPIVACAINE LIPOSOME 1.3 % IJ SUSP
INTRAMUSCULAR | Status: DC | PRN
  Administered 2020-01-21: 10 mL

## 2020-01-21 MED ORDER — TRANEXAMIC ACID 1000 MG/10ML IV SOLN
INTRAVENOUS | Status: DC | PRN
  Administered 2020-01-21: 2000 mg via TOPICAL

## 2020-01-21 MED ORDER — STERILE WATER FOR IRRIGATION IR SOLN
Status: DC | PRN
  Administered 2020-01-21: 2000 mL

## 2020-01-21 MED ORDER — BUPIVACAINE HCL (PF) 0.25 % IJ SOLN
INTRAMUSCULAR | Status: AC
  Filled 2020-01-21: qty 30

## 2020-01-21 SURGICAL SUPPLY — 47 items
BAG DECANTER FOR FLEXI CONT (MISCELLANEOUS) ×3 IMPLANT
BLADE SAW SGTL 18X1.27X75 (BLADE) ×2 IMPLANT
BLADE SAW SGTL 18X1.27X75MM (BLADE) ×1
BLADE SURG SZ10 CARB STEEL (BLADE) ×6 IMPLANT
CNTNR URN SCR LID CUP LEK RST (MISCELLANEOUS) ×1 IMPLANT
CONT SPEC 4OZ STRL OR WHT (MISCELLANEOUS) ×3
COVER PERINEAL POST (MISCELLANEOUS) ×3 IMPLANT
COVER SURGICAL LIGHT HANDLE (MISCELLANEOUS) ×3 IMPLANT
COVER WAND RF STERILE (DRAPES) ×3 IMPLANT
DECANTER SPIKE VIAL GLASS SM (MISCELLANEOUS) ×6 IMPLANT
DRAPE STERI IOBAN 125X83 (DRAPES) ×3 IMPLANT
DRAPE U-SHAPE 47X51 STRL (DRAPES) ×6 IMPLANT
DRSG AQUACEL AG ADV 3.5X10 (GAUZE/BANDAGES/DRESSINGS) ×3 IMPLANT
DURAPREP 26ML APPLICATOR (WOUND CARE) ×3 IMPLANT
ELECT BLADE TIP CTD 4 INCH (ELECTRODE) ×3 IMPLANT
ELECT REM PT RETURN 15FT ADLT (MISCELLANEOUS) ×3 IMPLANT
ELIMINATOR HOLE APEX DEPUY (Hips) ×3 IMPLANT
GLOVE BIO SURGEON STRL SZ7.5 (GLOVE) ×3 IMPLANT
GLOVE BIO SURGEON STRL SZ8.5 (GLOVE) ×3 IMPLANT
GLOVE BIOGEL PI IND STRL 8 (GLOVE) ×1 IMPLANT
GLOVE BIOGEL PI IND STRL 9 (GLOVE) ×1 IMPLANT
GLOVE BIOGEL PI INDICATOR 8 (GLOVE) ×2
GLOVE BIOGEL PI INDICATOR 9 (GLOVE) ×2
GOWN STRL REUS W/TWL XL LVL3 (GOWN DISPOSABLE) ×6 IMPLANT
HEAD CERAMIC DELTA 36 PLUS 1.5 (Hips) ×3 IMPLANT
HOLDER FOLEY CATH W/STRAP (MISCELLANEOUS) ×3 IMPLANT
KIT TURNOVER KIT A (KITS) IMPLANT
MANIFOLD NEPTUNE II (INSTRUMENTS) ×3 IMPLANT
NEEDLE HYPO 21X1.5 SAFETY (NEEDLE) ×6 IMPLANT
NS IRRIG 1000ML POUR BTL (IV SOLUTION) ×3 IMPLANT
PACK ANTERIOR HIP CUSTOM (KITS) ×3 IMPLANT
PENCIL SMOKE EVACUATOR (MISCELLANEOUS) IMPLANT
PIN SECT CUP 56MM (Hips) ×3 IMPLANT
PINNACLE ALTRX PLUS 4 N 36X56 (Hips) ×3 IMPLANT
STEM FEM ACTIS HIGH SZ8 (Stem) ×3 IMPLANT
SUT ETHIBOND NAB CT1 #1 30IN (SUTURE) ×3 IMPLANT
SUT VIC AB 0 CT1 27 (SUTURE)
SUT VIC AB 0 CT1 27XBRD ANBCTR (SUTURE) IMPLANT
SUT VIC AB 1 CTX 36 (SUTURE) ×3
SUT VIC AB 1 CTX36XBRD ANBCTR (SUTURE) ×1 IMPLANT
SUT VIC AB 2-0 CT1 27 (SUTURE)
SUT VIC AB 2-0 CT1 TAPERPNT 27 (SUTURE) IMPLANT
SUT VIC AB 3-0 CT1 27 (SUTURE) ×3
SUT VIC AB 3-0 CT1 TAPERPNT 27 (SUTURE) ×1 IMPLANT
SYR CONTROL 10ML LL (SYRINGE) ×9 IMPLANT
TRAY FOLEY MTR SLVR 16FR STAT (SET/KITS/TRAYS/PACK) IMPLANT
YANKAUER SUCT BULB TIP 10FT TU (MISCELLANEOUS) ×3 IMPLANT

## 2020-01-21 NOTE — Anesthesia Postprocedure Evaluation (Signed)
Anesthesia Post Note  Patient: Wesley Lawson  Procedure(s) Performed: RIGHT TOTAL HIP ARTHROPLASTY ANTERIOR APPROACH (Right Hip)     Patient location during evaluation: PACU Anesthesia Type: Spinal Level of consciousness: awake and alert Pain management: pain level controlled Vital Signs Assessment: post-procedure vital signs reviewed and stable Respiratory status: spontaneous breathing, nonlabored ventilation and respiratory function stable Cardiovascular status: blood pressure returned to baseline and stable Postop Assessment: no apparent nausea or vomiting Anesthetic complications: no   No complications documented.  Last Vitals:  Vitals:   01/21/20 1000 01/21/20 1015  BP: 133/84 130/87  Pulse: 73 83  Resp: 14 13  Temp:    SpO2: 100% 100%    Last Pain:  Vitals:   01/21/20 1000  TempSrc:   PainSc: 0-No pain                 Lynda Rainwater

## 2020-01-21 NOTE — Discharge Instructions (Signed)
INSTRUCTIONS AFTER JOINT REPLACEMENT   o Remove items at home which could result in a fall. This includes throw rugs or furniture in walking pathways o ICE to the affected joint every three hours while awake for 30 minutes at a time, for at least the first 3-5 days, and then as needed for pain and swelling.  Continue to use ice for pain and swelling. You may notice swelling that will progress down to the foot and ankle.  This is normal after surgery.  Elevate your leg when you are not up walking on it.   o Continue to use the breathing machine you got in the hospital (incentive spirometer) which will help keep your temperature down.  It is common for your temperature to cycle up and down following surgery, especially at night when you are not up moving around and exerting yourself.  The breathing machine keeps your lungs expanded and your temperature down.   DIET:  As you were doing prior to hospitalization, we recommend a well-balanced diet.  DRESSING / WOUND CARE / SHOWERING  Keep the surgical dressing until follow up.  The dressing is water proof, so you can shower without any extra covering.  IF THE DRESSING FALLS OFF or the wound gets wet inside, change the dressing with sterile gauze.  Please use good hand washing techniques before changing the dressing.  Do not use any lotions or creams on the incision until instructed by your surgeon.    ACTIVITY  o Increase activity slowly as tolerated, but follow the weight bearing instructions below.   o No driving for 6 weeks or until further direction given by your physician.  You cannot drive while taking narcotics.  o No lifting or carrying greater than 10 lbs. until further directed by your surgeon. o Avoid periods of inactivity such as sitting longer than an hour when not asleep. This helps prevent blood clots.  o You may return to work once you are authorized by your doctor.     WEIGHT BEARING   Weight bearing as tolerated with assist  device (walker, cane, etc) as directed, use it as long as suggested by your surgeon or therapist, typically at least 4-6 weeks.   EXERCISES  Results after joint replacement surgery are often greatly improved when you follow the exercise, range of motion and muscle strengthening exercises prescribed by your doctor. Safety measures are also important to protect the joint from further injury. Any time any of these exercises cause you to have increased pain or swelling, decrease what you are doing until you are comfortable again and then slowly increase them. If you have problems or questions, call your caregiver or physical therapist for advice.   Rehabilitation is important following a joint replacement. After just a few days of immobilization, the muscles of the leg can become weakened and shrink (atrophy).  These exercises are designed to build up the tone and strength of the thigh and leg muscles and to improve motion. Often times heat used for twenty to thirty minutes before working out will loosen up your tissues and help with improving the range of motion but do not use heat for the first two weeks following surgery (sometimes heat can increase post-operative swelling).   These exercises can be done on a training (exercise) mat, on the floor, on a table or on a bed. Use whatever works the best and is most comfortable for you.    Use music or television while you are exercising so that   the exercises are a pleasant break in your day. This will make your life better with the exercises acting as a break in your routine that you can look forward to.   Perform all exercises about fifteen times, three times per day or as directed.  You should exercise both the operative leg and the other leg as well.  Exercises include:   . Quad Sets - Tighten up the muscle on the front of the thigh (Quad) and hold for 5-10 seconds.   . Straight Leg Raises - With your knee straight (if you were given a brace, keep it on),  lift the leg to 60 degrees, hold for 3 seconds, and slowly lower the leg.  Perform this exercise against resistance later as your leg gets stronger.  . Leg Slides: Lying on your back, slowly slide your foot toward your buttocks, bending your knee up off the floor (only go as far as is comfortable). Then slowly slide your foot back down until your leg is flat on the floor again.  . Angel Wings: Lying on your back spread your legs to the side as far apart as you can without causing discomfort.  . Hamstring Strength:  Lying on your back, push your heel against the floor with your leg straight by tightening up the muscles of your buttocks.  Repeat, but this time bend your knee to a comfortable angle, and push your heel against the floor.  You may put a pillow under the heel to make it more comfortable if necessary.   A rehabilitation program following joint replacement surgery can speed recovery and prevent re-injury in the future due to weakened muscles. Contact your doctor or a physical therapist for more information on knee rehabilitation.    CONSTIPATION  Constipation is defined medically as fewer than three stools per week and severe constipation as less than one stool per week.  Even if you have a regular bowel pattern at home, your normal regimen is likely to be disrupted due to multiple reasons following surgery.  Combination of anesthesia, postoperative narcotics, change in appetite and fluid intake all can affect your bowels.   YOU MUST use at least one of the following options; they are listed in order of increasing strength to get the job done.  They are all available over the counter, and you may need to use some, POSSIBLY even all of these options:    Drink plenty of fluids (prune juice may be helpful) and high fiber foods Colace 100 mg by mouth twice a day  Senokot for constipation as directed and as needed Dulcolax (bisacodyl), take with full glass of water  Miralax (polyethylene glycol)  once or twice a day as needed.  If you have tried all these things and are unable to have a bowel movement in the first 3-4 days after surgery call either your surgeon or your primary doctor.    If you experience loose stools or diarrhea, hold the medications until you stool forms back up.  If your symptoms do not get better within 1 week or if they get worse, check with your doctor.  If you experience "the worst abdominal pain ever" or develop nausea or vomiting, please contact the office immediately for further recommendations for treatment.   ITCHING:  If you experience itching with your medications, try taking only a single pain pill, or even half a pain pill at a time.  You can also use Benadryl over the counter for itching or also to   help with sleep.   TED HOSE STOCKINGS:  Use stockings on both legs until for at least 2 weeks or as directed by physician office. They may be removed at night for sleeping.  MEDICATIONS:  See your medication summary on the "After Visit Summary" that nursing will review with you.  You may have some home medications which will be placed on hold until you complete the course of blood thinner medication.  It is important for you to complete the blood thinner medication as prescribed.  PRECAUTIONS:  If you experience chest pain or shortness of breath - call 911 immediately for transfer to the hospital emergency department.   If you develop a fever greater that 101 F, purulent drainage from wound, increased redness or drainage from wound, foul odor from the wound/dressing, or calf pain - CONTACT YOUR SURGEON.                                                   FOLLOW-UP APPOINTMENTS:  If you do not already have a post-op appointment, please call the office for an appointment to be seen by your surgeon.  Guidelines for how soon to be seen are listed in your "After Visit Summary", but are typically between 1-4 weeks after surgery.  OTHER INSTRUCTIONS:   Knee  Replacement:  Do not place pillow under knee, focus on keeping the knee straight while resting. CPM instructions: 0-90 degrees, 2 hours in the morning, 2 hours in the afternoon, and 2 hours in the evening. Place foam block, curve side up under heel at all times except when in CPM or when walking.  DO NOT modify, tear, cut, or change the foam block in any way.   DENTAL ANTIBIOTICS:  In most cases prophylactic antibiotics for Dental procdeures after total joint surgery are not necessary.  Exceptions are as follows:  1. History of prior total joint infection  2. Severely immunocompromised (Organ Transplant, cancer chemotherapy, Rheumatoid biologic meds such as [place])  3. Poorly controlled diabetes (A1C &gt; 8.0, blood glucose over 200)  If you have one of these conditions, contact your surgeon for an antibiotic prescription, prior to your dental procedure.   MAKE SURE YOU:  . Understand these instructions.  . Get help right away if you are not doing well or get worse.    Thank you for letting us be a part of your medical care team.  It is a privilege we respect greatly.  We hope these instructions will help you stay on track for a fast and full recovery!  Indwelling Urinary Catheter Care, Adult An indwelling urinary catheter is a thin tube that is put into your bladder. The tube helps to drain pee (urine) out of your body. The tube goes in through your urethra. Your urethra is where pee comes out of your body. Your pee will come out through the catheter, then it will go into a bag (drainage bag). Take good care of your catheter so it will work well. How to wear your catheter and bag Supplies needed  Sticky tape (adhesive tape) or a leg strap.  Alcohol wipe or soap and water (if you use tape).  A clean towel (if you use tape).  Large overnight bag.  Smaller bag (leg bag). Wearing your catheter Attach your catheter to your leg with tape or a leg strap.  Make sure the catheter  is not pulled tight.  If a leg strap gets wet, take it off and put on a dry strap.  If you use tape to hold the bag on your leg: 1. Use an alcohol wipe or soap and water to wash your skin where the tape made it sticky before. 2. Use a clean towel to pat-dry that skin. 3. Use new tape to make the bag stay on your leg. Wearing your bags You should have been given a large overnight bag.  You may wear the overnight bag in the day or night.  Always have the overnight bag lower than your bladder.  Do not let the bag touch the floor.  Before you go to sleep, put a clean plastic bag in a wastebasket. Then hang the overnight bag inside the wastebasket. You should also have a smaller leg bag that fits under your clothes.  Always wear the leg bag below your knee.  Do not wear your leg bag at night. How to care for your skin and catheter Supplies needed  A clean washcloth.  Water and mild soap.  A clean towel. Caring for your skin and catheter      Clean the skin around your catheter every day: 1. Wash your hands with soap and water. 2. Wet a clean washcloth in warm water and mild soap. 3. Clean the skin around your urethra.  If you are male:  Gently spread the folds of skin around your vagina (labia).  With the washcloth in your other hand, wipe the inner side of your labia on each side. Wipe from front to back.  If you are male:  Pull back any skin that covers the end of your penis (foreskin).  With the washcloth in your other hand, wipe your penis in small circles. Start wiping at the tip of your penis, then move away from the catheter.  Move the foreskin back in place, if needed. 4. With your free hand, hold the catheter close to where it goes into your body.  Keep holding the catheter during cleaning so it does not get pulled out. 5. With the washcloth in your other hand, clean the catheter.  Only wipe downward on the catheter.  Do not wipe upward toward your  body. Doing this may push germs into your urethra and cause infection. 6. Use a clean towel to pat-dry the catheter and the skin around it. Make sure to wipe off all soap. 7. Wash your hands with soap and water.  Shower every day. Do not take baths.  Do not use cream, ointment, or lotion on the area where the catheter goes into your body, unless your doctor tells you to.  Do not use powders, sprays, or lotions on your genital area.  Check your skin around the catheter every day for signs of infection. Check for: ? Redness, swelling, or pain. ? Fluid or blood. ? Warmth. ? Pus or a bad smell. How to empty the bag Supplies needed  Rubbing alcohol.  Gauze pad or cotton ball.  Tape or a leg strap. Emptying the bag Pour the pee out of your bag when it is ?- full, or at least 2-3 times a day. Do this for your overnight bag and your leg bag. 1. Wash your hands with soap and water. 2. Separate (detach) the bag from your leg. 3. Hold the bag over the toilet or a clean pail. Keep the bag lower than your hips and bladder. This  is so the pee (urine) does not go back into the tube. 4. Open the pour spout. It is at the bottom of the bag. 5. Empty the pee into the toilet or pail. Do not let the pour spout touch any surface. 6. Put rubbing alcohol on a gauze pad or cotton ball. 7. Use the gauze pad or cotton ball to clean the pour spout. 8. Close the pour spout. 9. Attach the bag to your leg with tape or a leg strap. 10. Wash your hands with soap and water. Follow instructions for cleaning the drainage bag:  From the product maker.  As told by your doctor. How to change the bag Supplies needed  Alcohol wipes.  A clean bag.  Tape or a leg strap. Changing the bag Replace your bag when it starts to leak, smell bad, or look dirty. 1. Wash your hands with soap and water. 2. Separate the dirty bag from your leg. 3. Pinch the catheter with your fingers so that pee does not spill  out. 4. Separate the catheter tube from the bag tube where these tubes connect (at the connection valve). Do not let the tubes touch any surface. 5. Clean the end of the catheter tube with an alcohol wipe. Use a different alcohol wipe to clean the end of the bag tube. 6. Connect the catheter tube to the tube of the clean bag. 7. Attach the clean bag to your leg with tape or a leg strap. Do not make the bag tight on your leg. 8. Wash your hands with soap and water. General rules   Never pull on your catheter. Never try to take it out. Doing that can hurt you.  Always wash your hands before and after you touch your catheter or bag. Use a mild, fragrance-free soap. If you do not have soap and water, use hand sanitizer.  Always make sure there are no twists or bends (kinks) in the catheter tube.  Always make sure there are no leaks in the catheter or bag.  Drink enough fluid to keep your pee pale yellow.  Do not take baths, swim, or use a hot tub.  If you are male, wipe from front to back after you poop (have a bowel movement). Contact a doctor if:  Your pee is cloudy.  Your pee smells worse than usual.  Your catheter gets clogged.  Your catheter leaks.  Your bladder feels full. Get help right away if:  You have redness, swelling, or pain where the catheter goes into your body.  You have fluid, blood, pus, or a bad smell coming from the area where the catheter goes into your body.  Your skin feels warm where the catheter goes into your body.  You have a fever.  You have pain in your: ? Belly (abdomen). ? Legs. ? Lower back. ? Bladder.  You see blood in the catheter.  Your pee is pink or red.  You feel sick to your stomach (nauseous).  You throw up (vomit).  You have chills.  Your pee is not draining into the bag.  Your catheter gets pulled out. Summary  An indwelling urinary catheter is a thin tube that is placed into the bladder to help drain pee (urine)  out of the body.  The catheter is placed into the part of the body that drains pee from the bladder (urethra).  Taking good care of your catheter will keep it working properly and help prevent problems.  Always wash your  hands before and after touching your catheter or bag.  Never pull on your catheter or try to take it out. This information is not intended to replace advice given to you by your health care provider. Make sure you discuss any questions you have with your health care provider. Document Revised: 09/22/2018 Document Reviewed: 01/14/2017 Elsevier Patient Education  [place]. Total Hip Replacement, Anterior, Care After This sheet gives you information about how to care for yourself after your procedure. Your health care provider may also give you more specific instructions. If you have problems or questions, contact your health care provider. What can I expect after the procedure? After the procedure, it is common to have:  Pain.  Stiffness.  Discomfort. Follow these instructions at home: Medicines  Take over-the-counter and prescription medicines only as told by your health care provider.  If you were prescribed a blood thinner (anticoagulant) to help prevent blood clots, take it as told by your health care provider. Incision care   Follow instructions from your health care provider about how to take care of your incision. Make sure you: ? Wash your hands with soap and water before you change your bandage (dressing). If soap and water are not available, use hand sanitizer. ? Change your dressing as told by your health care provider. ? Leave stitches (sutures), skin glue, or adhesive strips in place. These skin closures may need to stay in place for 2 weeks or longer. If adhesive strip edges start to loosen and curl up, you may trim the loose edges. Do not remove adhesive strips completely unless your health care provider tells you to do that.  Check your  incision area every day for signs of infection. Check for: ? Redness, swelling, or pain. ? Fluid or blood. ? Warmth. ? Pus or a bad smell. Bathing  Do not take baths, swim, or use a hot tub until your health care provider approves. Ask your health care provider if you may take showers. You may only be allowed to take sponge baths.  Keep the dressing dry until your health care provider says it can be removed. Managing pain, stiffness, and swelling   If directed, put ice on the affected area. ? Put ice in a plastic bag. ? Place a towel between your skin and the bag. ? Leave the ice on for 20 minutes, 2-3 times a day.  Move your toes often to avoid stiffness and to lessen swelling.  Raise (elevate) your leg above the level of your heart while you are sitting or lying down. Activity  Rest as told by your health care provider.  Avoid sitting for a long time without moving. Get up to take short walks every 1-2 hours. This is important to improve blood flow and breathing. Ask for help if you feel weak or unsteady.  Do exercises as told by your health care provider or physical therapist.  Follow instructions from your health care provider about using a walker, crutches, or a cane. ? You may use your legs to support (bear) your body weight as told by your health care provider. Follow instructions about how much weight you may safely support on your affected leg (weight-bearing restrictions). ? A physical therapist may show you how to get out of a bed and chair and how to go up and down stairs. You will first do this with a walker, crutches, or a cane and then without any of these devices. ? Once you are able to walk  without a limp, you may stop using a walker, crutches, or cane.  Return to your normal activities as told by your health care provider. Ask your health care provider what activities are safe for you. Safety  To help prevent falls, keep floors clear of objects you may trip  over, and place items that you may need within easy reach.  Wear an apron or tool belt with pockets for carrying objects. This leaves your hands free to help with your balance. Driving  Do not drive or use heavy machinery while taking prescription pain medicine.  Ask your health care provider when it is safe to drive. General instructions  Wear compression stockings as told by your health care provider. These stockings help to prevent blood clots and reduce swelling in your legs.  Continue with breathing exercises as directed by your health care provider. This helps prevent lung infection.  If you are taking prescription pain medicine, take actions to prevent or treat constipation. Your health care provider may recommend that you: ? Drink enough fluid to keep your urine pale yellow. ? Eat foods that are high in fiber, such as fresh fruits and vegetables, whole grains, and beans. ? Limit foods that are high in fat and processed sugars, such as fried or sweet foods. ? Take an over-the-counter or prescription medicine for constipation.  Do not use any products that contain nicotine or tobacco, such as cigarettes and e-cigarettes. These can delay bone healing. If you need help quitting, ask your health care provider.  Tell your health care provider if you plan to have dental work. Also: ? Tell your dentist about your joint replacement. ? Ask your health care provider if there are any special instructions you need to follow before having dental care and routine cleanings.  Keep all follow-up visits as told by your health care provider. This is important. Contact a health care provider if:  You have a fever or chills.  You have a cough or feel short of breath.  Your medicine is not controlling your pain.  You have redness, swelling, or pain around your incision.  You have fluid or blood coming from your incision.  Your incision feels warm to the touch.  You have pus or a bad smell  coming from your incision. Get help right away if you have:  Severe pain.  Trouble breathing.  Chest pain.  Redness, swelling, pain, and warmth in your calf or leg. Summary  Follow instructions from your health care provider about how to take care of your incision.  Do not take baths, swim, or use a hot tub until your health care provider approves.  Use crutches, a walker, or a cane as told by your health care provider.  If you were prescribed a blood thinner (anticoagulant) to help prevent blood clots, take it as told by your health care provider. This information is not intended to replace advice given to you by your health care provider. Make sure you discuss any questions you have with your health care provider. Document Revised: 10/09/2018 Document Reviewed: 09/14/2017 Elsevier Patient Education  [place].

## 2020-01-21 NOTE — Progress Notes (Signed)
PACU   Attempted to call Dr Mayer Camel and his assistant x 2 , at [phone] and [phone] today regarding the discharge instructions. Patient requesting prescriptions to be sent to his pharmacy. await  response .

## 2020-01-21 NOTE — Anesthesia Procedure Notes (Signed)
Spinal  Start time: 01/21/2020 7:18 AM End time: 01/21/2020 7:23 AM Staffing Performed: resident/CRNA  Resident/CRNA: Garrel Ridgel, CRNA Preanesthetic Checklist Completed: patient identified, IV checked, site marked, risks and benefits discussed, surgical consent, monitors and equipment checked, pre-op evaluation and timeout performed Spinal Block Patient position: sitting Prep: ChloraPrep Patient monitoring: continuous pulse ox, heart rate and blood pressure Approach: midline Location: L3-4 Injection technique: single-shot Needle Needle type: Pencan  Needle gauge: 24 G Needle length: 9 cm Needle insertion depth: 5 cm Assessment Sensory level: T8

## 2020-01-21 NOTE — Op Note (Signed)
PATIENT ID:      Wesley Lawson  MRN:     [id number] DOB/AGE:    64/10/1955 / 64 y.o.  OPERATIVE REPORT   DATE OF PROCEDURE:  01/21/2020      PREOPERATIVE DIAGNOSIS:  RIGHT HIP OSTEOARTHRITIS                                                         POSTOPERATIVE DIAGNOSIS:  Same                                                         PROCEDURE: Anterior R total hip arthroplasty using a 56 mm DePuy Pinnacle  Cup, [employer], 0-degree polyethylene liner, a +1.5 mm x 62mm ceramic head, a 8hi Depuy Actis stem  SURGEON: Kerin Salen  ASSISTANT:   Kerry Hough. [employer]  (present throughout entire procedure and necessary for timely completion of the procedure)   ANESTHESIA: Spinal, Exparel 133mg  injection BLOOD LOSS: 400 cc FLUID REPLACEMENT: 1800 cc crystalloid TRANEXAMIC ACID: 1gm IV, 2gm Topical COMPLICATIONS: none    INDICATIONS FOR PROCEDURE: A 64 y.o. year-old With  RIGHT HIP OSTEOARTHRITIS   for 3 years, x-rays show bone-on-bone arthritic changes, and osteophytes. Despite conservative measures with observation, anti-inflammatory medicine, narcotics, use of a cane, has severe unremitting pain and can ambulate only a few blocks before resting. Patient desires elective R total hip arthroplasty to decrease pain and increase function. The risks, benefits, and alternatives were discussed at length including but not limited to the risks of infection, bleeding, nerve injury, stiffness, blood clots, the need for revision surgery, cardiopulmonary complications, among others, and they were willing to proceed. Questions answered      PROCEDURE IN DETAIL: The patient was identified by armband,   received preoperative IV antibiotics in the holding area at [facility], taken to the operating room , appropriate anesthetic monitors   were attached and anesthesia was induced with the patient on the gurney. HANA boots were applied to the feet, and the patient  was transferred to the HANA  table with a peroneal post and support underneath the non-operative leg. Theoperative lower extremity was then prepped and draped in the usual sterile fashion from just above the iliac crest to the knee. And a timeout procedure was performed. Kerry Hough. [facility] was present and scrubbed throughout the case, critical for assistance with, positioning, exposure, retraction, instrumentation, and closure.Skin along incision area was injected with 10 cc of Exparel solution. We then made a 14 cm incision along the interval at the leading edge of the tensor fascia lata of starting at 2 cm lateral to the ASIS. Small bleeders in the skin and subcutaneous tissue identified and cauterized we dissected down to the fascia and made an incision in the fascia allowing [place] to elevate the fascia of the tensor muscle and exploited the interval between the rectus and the tensor fascia lata. A Cobra retractor was then placed along the superior neck of the femur. A cerebellar retractor was used to expose the interval between the tensor fascia lata and the rectus femoris.  We identified and  cauterized the ascending branch of the anterior circumflex artery. A second Cobra retractor along the inferior neck of the femur. A small Hohmann retractor was placed underneath the origin of the rectus femoris, giving [place] good medial exposure. Using Ronguers fatty tissue was removed from in front of the anterior capsule. The capsule was then incised, starting out at the superior anterior rim of the acetabulum going laterally along the anterior neck. The capsule was then teed along the neck superiorly and inferiorly. Electrocautery was used to release capsule from the anterior and medial neck of the femur to allow external rotation. Cobra retractors were then placed along the inferior and superior neck allowing [place] to perform a standard neck cut and removed the femoral head with a power corkscrew. We then placed a medium bent homan retractor in the  cotyloid notch and posteriorly along the acetabular rim a narrow Cobra retractor. Exposed labral tissue and osteophytes were then removed. We then sequentially reamed up to a 55 mm basket reamer obtaining good coverage in all quadrants, verified by C-arm imaging. Under C-arm control we then hammered into place a 56 mm Pinnacle cup in 45 of abduction and 15 of anteversion. The cup seated nicely and required no supplemental screws. We then placed a central hole Eliminator and a 0 polyethylene liner. The foot was then externally rotated to 130-140. The limb was extended and adducted to the floor, delivering the proximal femur up into the wound. A medium curved Hohmann retractor was placed over the greater trochanter and a long Homan retractor along the posterior femoral neck completing the exposure and lateralizing the femur. We then performed releases superiorly and and inferiorly of the capsule going back to the pirformis fossa superiorly and to the lesser trochanter inferiorly. We then entered the proximal femur with the box cutting offset chisel followed by, a canal sounder, the chili pepper and broaching up to a 8 broach. This seated nicely and we reamed the calcar. A trial reduction was performed with a 1.5 mm X 36 mm head.The limb lengths were excellent the hip was stable in 90 of external rotation. At this point the trial components removed and we hammered into place a # 8 hi  Offset Actis stem with Gryption coating. A + 1.5 mm x 36 ceramic head was then hammered into place. The hip was reduced and final C-arm images obtained. The wound was thoroughly irrigated with normal saline solution. We repaired the ant capsule and the tensor fascia lot a with running 0 vicryl suture. the subcutaneous tissue was closed with 2-0 and 3-0 Vicryl suture followed by an Aquacil dressing. At this point the patient was awaken and transferred to hospital gurney without difficulty.   Kerin Salen 01/21/2020, 6:02 AM

## 2020-01-21 NOTE — Evaluation (Signed)
Physical Therapy Evaluation Patient Details Name: Wesley Lawson MRN: [id number] DOB: 08-16-1955 Today's Date: 01/21/2020   History of Present Illness  Patient is 64 y.o. male s/p Rt THA anterior approach on 01/21/20 with PMH significant for HTN, HLD, OA, angina.  Clinical Impression  Wesley Lawson is a 64 y.o. male POD 0 s/p Rt THA. Patient reports independence with occasional use of cane for mobility at baseline. Patient is now limited by functional impairments (see PT problem list below) and requires min guard/supervision for transfers and gait with RW. Patient was able to ambulate ~100 feet with RW and min guard and cues for safe walker management. Patient educated on safe sequencing for stair mobility and verbalized safe guarding position for people assisting with mobility. Patient instructed in exercises to facilitate ROM and circulation. Patient will benefit from continued skilled PT interventions to address impairments and progress towards PLOF. Patient has met mobility goals at adequate level for discharge home; will continue to follow if pt continues acute stay to progress towards Mod I goals.     Follow Up Recommendations Follow surgeon's recommendation for DC plan and follow-up therapies;Home health PT    Equipment Recommendations  Rolling walker with 5" wheels    Recommendations for Other Services       Precautions / Restrictions Precautions Precautions: Fall Restrictions Weight Bearing Restrictions: No Other Position/Activity Restrictions: WBAT      Mobility  Bed Mobility Overal bed mobility: Needs Assistance Bed Mobility: Supine to Sit     Supine to sit: Supervision     General bed mobility comments: no assist required, pt needs extra time.  Transfers Overall transfer level: Needs assistance Equipment used: Rolling walker (2 wheeled) Transfers: Sit to/from Stand Sit to Stand: Supervision;Min guard         General transfer comment: VC's for safe technique  with RW, no assist required. pt performed from EOB and recliner.   Ambulation/Gait Ambulation/Gait assistance: Min guard;Min assist Gait Distance (Feet): 100 Feet Assistive device: Rolling walker (2 wheeled) Gait Pattern/deviations: Step-to pattern;Decreased stride length;Decreased weight shift to right;Decreased dorsiflexion - right Gait velocity: decr   General Gait Details: VC's required for step pattern and initially min assist to manage walker position. No overt LOB noted. Cues needed to improve posture and to increase Rt ankle dorsiflexion. Pt progressed to min guard for gait throughout.   Stairs Stairs: Yes Stairs assistance: Min guard Stair Management: No rails;With walker;Backwards Number of Stairs: 1 General stair comments: cues for safe step back technique to neogiate single step. pt steady wiht stepping and raising walker to step.  Wheelchair Mobility    Modified Rankin (Stroke Patients Only)       Balance Overall balance assessment: Needs assistance Sitting-balance support: Feet supported Sitting balance-Leahy Scale: Good     Standing balance support: Bilateral upper extremity supported;During functional activity Standing balance-Leahy Scale: Fair              Pertinent Vitals/Pain Pain Assessment: 0-10 Pain Score: 0-No pain Pain Intervention(s): Limited activity within patient's tolerance;Monitored during session;Repositioned (no pain, Rt foot tingling)    Home Living Family/patient expects to be discharged to:: Private residence Living Arrangements: Spouse/significant other;Children Available Help at Discharge: Family Type of Home: House Home Access: Stairs to [employer] entry Entrance Stairs-Rails: None Entrance Stairs-Number of Steps: 1 Home Layout: One level Home Equipment: Clinical cytogeneticist - 2 wheels Additional Comments: pt has 1 step at front or level entry at back    Prior Function Level of Independence:  Independent          Comments: works at [employer]   Dominant Hand: Right    Extremity/Trunk Assessment   Upper Extremity Assessment Upper Extremity Assessment: Overall WFL for tasks assessed    Lower Extremity Assessment Lower Extremity Assessment: RLE deficits/detail;Overall [facility] for tasks assessed RLE Deficits / Details: 4/5 for quad strength, 3+/5 for ankle dosrifelxion. pt reports numbness in entire Rt LE RLE Sensation:  (light touch is grossly intact) RLE Coordination: WNL    Cervical / Trunk Assessment Cervical / Trunk Assessment: Normal  Communication   Communication: No difficulties  Cognition Arousal/Alertness: Awake/alert Behavior During Therapy: WFL for tasks assessed/performed Overall Cognitive Status: Within Functional Limits for tasks assessed                 General Comments      Exercises Total Joint Exercises Ankle Circles/Pumps: AROM;Both;10 reps;Seated Quad Sets: AROM;Right;5 reps;Seated Short Arc Quad: AROM;Right;5 reps;Seated Heel Slides: AAROM;Right;5 reps;Seated Hip ABduction/ADduction: Right;5 reps;[facility]   Assessment/Plan    PT Assessment Patient needs continued PT services  PT Problem List Decreased strength;Decreased range of motion;Decreased balance;Decreased activity tolerance;Decreased mobility;Decreased knowledge of use of DME;Pain;Decreased knowledge of precautions       PT Treatment Interventions DME instruction;Gait training;Stair training;Functional mobility training;Therapeutic activities;Therapeutic exercise;Balance training;Patient/family education    PT Goals (Current goals can be found in the Care Plan section)  Acute Rehab PT Goals Patient Stated Goal: get home and be able to go up/down stairs without pain PT Goal Formulation: With patient Time For Goal Achievement: 01/28/20 Potential to Achieve Goals: Good    Frequency 7X/week   Barriers to discharge           AM-PAC PT "6 Clicks" Mobility  Outcome  Measure Help needed turning from your back to your side while in a flat bed without using bedrails?: None Help needed moving from lying on your back to sitting on the side of a flat bed without using bedrails?: A Little Help needed moving to and from a bed to a chair (including a wheelchair)?: A Little Help needed standing up from a chair using your arms (e.g., wheelchair or bedside chair)?: A Little Help needed to walk in hospital room?: A Little Help needed climbing 3-5 steps with a railing? : A Little 6 Click Score: 19    End of Session Equipment Utilized During Treatment: Gait belt Activity Tolerance: Patient tolerated treatment well Patient left: in chair;with call bell/phone within reach Nurse Communication: Mobility status (needs RW to dc home) PT Visit Diagnosis: Other abnormalities of gait and mobility (R26.89);Difficulty in walking, not elsewhere classified (R26.2)    Time: 1355-1450 PT Time Calculation (min) (ACUTE ONLY): 55 min   Charges:   PT Evaluation $PT Eval Low Complexity: 1 Low PT Treatments $Gait Training: 23-37 mins $Therapeutic Exercise: 8-22 mins       Verner Mould, DPT Acute Rehabilitation Services  Office [phone] Pager [phone]  01/21/2020 3:41 PM

## 2020-01-21 NOTE — Consult Note (Addendum)
Urology Consult   Physician requesting consult: Frederik Pear, MD  Reason for consult: Urinary retention/difficult Foley catheter placement  History of Present Illness: Wesley Lawson is a 64 y.o. male status post right hip replacement with Dr. Mayer Camel.  Despite multiple attempts, the operative room staff was unable to place a Foley catheter.  He currently has spinal anesthesia and has no sensation below the umbilicus.  He does have a history of BPH and is followed by Dr. Junious Silk.  Preoperatively, the patient reports a good force of stream and feels like he was adequately emptying his bladder.  He denies any recent urinary tract infections, dysuria or episodes of hematuria.  Past Medical History:  Diagnosis Date  . Abnormal EKG   . Allergy   . Arthritis    hands, knees  . Chest pain    a. normal echo stress test in 2010 with mild to moderate LVH.   Marland Kitchen Dyslipidemia   . History of exercise stress test 08/2016   normal  . Hyperlipidemia   . Hypertension     Past Surgical History:  Procedure Laterality Date  . COLONOSCOPY  07/21/2017   tubular adenoma, Dr. Wilfrid Lund  . NO PAST SURGERIES    . [place] EXTRACTION      Current Hospital Medications:  Home Meds:  Current Facility-Administered Medications for the 01/21/20 encounter [facility] Encounter)  Medication  . 0.9 %  sodium chloride infusion   Current Meds  Medication Sig  . acetaminophen (TYLENOL) 325 MG tablet Take 650 mg by mouth every 6 (six) hours as needed for moderate pain.   Marland Kitchen aspirin EC 81 MG tablet Take 1 tablet (81 mg total) by mouth daily. (Patient not taking: Reported on 01/14/2020)  . lisinopril-hydrochlorothiazide (ZESTORETIC) 20-25 MG tablet Take 1 tablet by mouth daily.  . pravastatin (PRAVACHOL) 20 MG tablet Take 1 tablet (20 mg total) by mouth every evening.  . valACYclovir (VALTREX) 1000 MG tablet TAKE 2 TABLETS BY MOUTH TWICE DAILY FOR 1 DAY FOR COLD SORE OR THREE TIMES DAILY FOR 1 WEEK FOR SHINGLES  .  [DISCONTINUED] albuterol (VENTOLIN HFA) 108 (90 Base) MCG/ACT inhaler Inhale 2 puffs into the lungs every 6 (six) hours as needed for wheezing or shortness of breath.  . [DISCONTINUED] fluticasone (FLONASE) 50 MCG/ACT nasal spray Place 1 spray into both nostrils daily. (Patient not taking: Reported on 01/14/2020)  . [DISCONTINUED] lisinopril-hydrochlorothiazide (ZESTORETIC) 20-25 MG tablet TAKE 1 TABLET BY MOUTH DAILY  . [DISCONTINUED] pravastatin (PRAVACHOL) 20 MG tablet Take 1 tablet (20 mg total) by mouth every evening.  . [DISCONTINUED] sildenafil (REVATIO) 20 MG tablet 1-5 tablets (20 mg to 100 mg) prior to sexual activity daily prn (Patient taking differently: Take 20-100 mg by mouth daily as needed (ED). )    Scheduled Meds: . bupivacaine liposome  10 mL Other Once  . tranexamic acid (CYKLOKAPRON) topical - INTRAOP  2,000 mg Topical To OR   Continuous Infusions: . lactated ringers    . lactated ringers 75 mL/hr at 01/21/20 0711  . lactated ringers    . tranexamic acid     PRN Meds:.0.9 % irrigation (POUR BTL), bupivacaine (PF), bupivacaine liposome, HYDROmorphone (DILAUDID) injection, meperidine (DEMEROL) injection, oxyCODONE **OR** oxyCODONE, promethazine, sodium chloride flush, sterile water for irrigation, tranexamic acid (CYKLOKAPRON) topical - INTRAOP  Allergies: No Known Allergies  Family History  Problem Relation Age of Onset  . Diabetes Mother   . Stroke Mother   . Diabetes Sister   . Asthma Daughter   .  Colon polyps Neg Hx   . Esophageal cancer Neg Hx   . Rectal cancer Neg Hx   . Stomach cancer Neg Hx   . Cancer Neg Hx   . Heart disease Neg Hx   . Colon cancer Neg Hx   . Liver cancer Neg Hx   . Pancreatic cancer Neg Hx     Social History:  reports that he has never smoked. He has never used smokeless tobacco. He reports current alcohol use of about 3.0 standard drinks of alcohol per week. He reports that he does not use drugs.  ROS: A complete review of systems  was performed.  All systems are negative except for pertinent findings as noted.  Physical Exam:  Vital signs in last 24 hours: Temp:  [98.1 F (36.7 C)-98.4 F (36.9 C)] 98.4 F (36.9 C) (08/09 0948) Pulse Rate:  [63-97] 81 (08/09 1115) Resp:  [12-20] 18 (08/09 1115) BP: (120-161)/(76-95) 141/92 (08/09 1115) SpO2:  [96 %-100 %] 96 % (08/09 1115) Constitutional:  Alert and oriented, No acute distress Cardiovascular: Regular rate and rhythm, No JVD Respiratory: Normal respiratory effort, Lungs clear bilaterally GI: Abdomen is soft, nontender, nondistended, no abdominal masses GU: No CVA tenderness Lymphatic: No lymphadenopathy Neurologic: Grossly intact, no focal deficits Psychiatric: Normal mood and affect GU: Penis is circumcised with blood at the urethral meatus. Laboratory Data:  No results for input(s): WBC, HGB, HCT, PLT in the last 72 hours.  No results for input(s): NA, K, CL, GLUCOSE, BUN, CALCIUM, CREATININE in the last 72 hours.  Invalid input(s): CO3   Results for orders placed or performed during the hospital encounter of 01/21/20 (from the past 24 hour(s))  ABO/Rh     Status: None   Collection Time: 01/21/20  6:15 AM  Result Value Ref Range   ABO/RH(D)      O POS Performed at [facility], [place] [address] [place], [place] [address]    Recent Results (from the past 240 hour(s))  SARS CORONAVIRUS 2 (TAT 6-24 HRS) Nasopharyngeal Nasopharyngeal Swab     Status: None   Collection Time: 01/17/20 11:36 AM   Specimen: Nasopharyngeal Swab  Result Value Ref Range Status   SARS Coronavirus 2 NEGATIVE NEGATIVE Final    Comment: (NOTE) SARS-CoV-2 target nucleic acids are NOT DETECTED.  The SARS-CoV-2 RNA is generally detectable in upper and lower respiratory specimens during the acute phase of infection. Negative results do not preclude SARS-CoV-2 infection, do not rule out co-infections with other pathogens, and should not be used as the sole  basis for treatment or other patient management decisions. Negative results must be combined with clinical observations, patient history, and epidemiological information. The expected result is Negative.  Fact Sheet for Patients: SugarRoll.be  Fact Sheet for Healthcare Providers: https://www.woods-mathews.com/  This test is not yet approved or cleared by the [place] FDA and  has been authorized for detection and/or diagnosis of SARS-CoV-2 by FDA under an Emergency Use Authorization (EUA). This EUA will remain  in effect (meaning this test can be used) for the duration of the COVID-19 declaration under Se ction 564(b)(1) of the Act, 21 U.S.C. section 360bbb-3(b)(1), unless the authorization is terminated or revoked sooner.  Performed at [facility], [place] [address], [place], [place] [address]     Renal Function: No results for input(s): CREATININE in the last 168 hours. Estimated Creatinine Clearance: 66.5 mL/min (A) (by C-G formula based on SCr of 1.4 mg/dL (H)).  Radiologic Imaging: DG  C-Arm 1-60 Min-No Report  Result Date: 01/21/2020 Fluoroscopy was utilized by the requesting physician.  No radiographic interpretation.   DG HIP OPERATIVE UNILAT W OR W/O PELVIS RIGHT  Result Date: 01/21/2020 CLINICAL DATA:  Right hip arthroplasty. EXAM: OPERATIVE right HIP (WITH PELVIS IF PERFORMED) 2 VIEWS TECHNIQUE: Fluoroscopic spot image(s) were submitted for interpretation post-operatively. COMPARISON:  07/23/2019 FINDINGS: Intraoperative AP view of the lower pelvis and right hip shows the patient to be status post right hip replacement. No evidence for immediate hardware complication. IMPRESSION: Status post right hip replacement. No evidence for immediate hardware complication. Electronically Signed   By: Misty Stanley M.D.   On: 01/21/2020 09:06    I independently reviewed the above imaging studies.  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  A flexible cystoscope was then inserted into the urethra and advanced until an area of urethral trauma was identified in the membranous urethra.  A Glidewire was then navigated beyond the area of urethral trauma and into the bladder.  The scope was then advanced over the wire and into the bladder.  A complete intravesical inspection revealed no additional pathology.  The flexible cystoscope was then removed, leaving the wire in place.  A 16 French Foley catheter was then advanced over the wire and into the bladder with return of clear-yellow urine.  The catheter balloon was then inflated with 10 mL of sterile water and placed to gravity drainage.  The patient tolerated the procedure well.  Impression/Recommendation 64 year old male with a urethral trauma following an attempted Foley catheter placement with a false passage in the membranous urethra.  Keep Foley catheter in place for 1 week.  Empiric course of Keflex has been sent into his pharmacy.  I will arrange follow-up for voiding trial in our office.  Ellison Hughs, MD [facility] 01/21/2020, 11:42 AM

## 2020-01-21 NOTE — Transfer of Care (Signed)
Immediate Anesthesia Transfer of Care Note  Patient: Wesley Lawson  Procedure(s) Performed: RIGHT TOTAL HIP ARTHROPLASTY ANTERIOR APPROACH (Right Hip)  Patient Location: PACU  Anesthesia Type:Spinal  Level of Consciousness: awake, alert , oriented and patient cooperative  Airway & Oxygen Therapy: Patient Spontanous Breathing and Patient connected to face mask oxygen  Post-op Assessment: Report given to RN and Post -op Vital signs reviewed and stable  Post vital signs: Reviewed and stable  Last Vitals:  Vitals Value Taken Time  BP 120/76 01/21/20 0921  Temp    Pulse 77 01/21/20 0924  Resp    SpO2 100 % 01/21/20 0924  Vitals shown include unvalidated device data.  Last Pain:  Vitals:   01/21/20 0555  TempSrc: Oral         Complications: No complications documented.

## 2020-01-21 NOTE — Interval H&P Note (Signed)
History and Physical Interval Note:  01/21/2020 6:01 AM  Wesley Lawson  has presented today for surgery, with the diagnosis of RIGHT HIP OSTEOARTHRITIS.  The various methods of treatment have been discussed with the patient and family. After consideration of risks, benefits and other options for treatment, the patient has consented to  Procedure(s): RIGHT TOTAL HIP ARTHROPLASTY ANTERIOR APPROACH (Right) as a surgical intervention.  The patient's history has been reviewed, patient examined, no change in status, stable for surgery.  I have reviewed the patient's chart and labs.  Questions were answered to the patient's satisfaction.     Kerin Salen

## 2020-01-22 ENCOUNTER — Encounter (HOSPITAL_COMMUNITY): Payer: Self-pay | Admitting: Orthopedic Surgery

## 2020-01-28 ENCOUNTER — Telehealth: Payer: Self-pay

## 2020-01-28 NOTE — Telephone Encounter (Signed)
Pt. Called stating he had surgery on his hip last week they told him to stop taking his BP medicine Lisinopril/hctz, but now his rt. Leg is swollen and he is wondering if he should go back on his BP medicine now so it can help with the swelling in his rt. Leg. They did not tell him how long to be off of the BP medicine.

## 2020-01-29 NOTE — Telephone Encounter (Signed)
Yes I would start back blood pressure medication.  He should call orthopedics office for appointment if leg is painful and swollen , particularly in calf region.

## 2020-01-29 NOTE — Telephone Encounter (Signed)
Left detail message on machine for patient and asked patient to call the office or leave a mychart message should he have additional questions.

## 2020-02-20 ENCOUNTER — Encounter: Payer: Self-pay | Admitting: Medical

## 2020-02-20 ENCOUNTER — Other Ambulatory Visit: Payer: Self-pay

## 2020-02-20 ENCOUNTER — Ambulatory Visit (INDEPENDENT_AMBULATORY_CARE_PROVIDER_SITE_OTHER): Payer: BC Managed Care – PPO | Admitting: Medical

## 2020-02-20 VITALS — BP 120/74 | HR 80 | Temp 98.4°F | Wt 244.0 lb

## 2020-02-20 DIAGNOSIS — I10 Essential (primary) hypertension: Secondary | ICD-10-CM | POA: Diagnosis not present

## 2020-02-20 DIAGNOSIS — Z96641 Presence of right artificial hip joint: Secondary | ICD-10-CM | POA: Diagnosis not present

## 2020-02-20 DIAGNOSIS — R7989 Other specified abnormal findings of blood chemistry: Secondary | ICD-10-CM | POA: Insufficient documentation

## 2020-02-20 DIAGNOSIS — R7309 Other abnormal glucose: Secondary | ICD-10-CM | POA: Insufficient documentation

## 2020-02-20 NOTE — Progress Notes (Signed)
Subjective: Chief Complaint  Patient presents with  . other    surgical f/u rt. hip replacement has slight limp    Here for recheck  He recently had right hip replacement surgery with Dr. Mayer Camel.  He had postop follow-up and has been doing physical therapy.  He was at home first but has now transition to outpatient physical therapy  Compliant with medications including blood pressure medication.  Following up on kidney marker from last few labs.  He is avoiding NSAIDs  No new complaints   Past Medical History:  Diagnosis Date  . Abnormal EKG   . Allergy   . Arthritis    hands, knees  . Chest pain    a. normal echo stress test in 2010 with mild to moderate LVH.   Marland Kitchen Dyslipidemia   . History of exercise stress test 08/2016   normal  . Hyperlipidemia   . Hypertension    Current Outpatient Medications on File Prior to Visit  Medication Sig Dispense Refill  . acetaminophen (TYLENOL) 325 MG tablet Take 650 mg by mouth every 6 (six) hours as needed for moderate pain.     Marland Kitchen aspirin EC 81 MG tablet Take 1 tablet (81 mg total) by mouth 2 (two) times daily. 60 tablet 0  . lisinopril-hydrochlorothiazide (ZESTORETIC) 20-25 MG tablet Take 1 tablet by mouth daily. 90 tablet 1  . pravastatin (PRAVACHOL) 20 MG tablet Take 1 tablet (20 mg total) by mouth every evening. 90 tablet 3  . sildenafil (REVATIO) 20 MG tablet Take 1-5 tablets (20-100 mg total) by mouth daily as needed (ED). 50 tablet 2  . tiZANidine (ZANAFLEX) 2 MG tablet Take 1 tablet (2 mg total) by mouth every 6 (six) hours as needed. 60 tablet 0  . valACYclovir (VALTREX) 1000 MG tablet TAKE 2 TABLETS BY MOUTH TWICE DAILY FOR 1 DAY FOR COLD SORE OR THREE TIMES DAILY FOR 1 WEEK FOR SHINGLES 30 tablet 1  . oxyCODONE-acetaminophen (PERCOCET/ROXICET) 5-325 MG tablet Take 1 tablet by mouth every 4 (four) hours as needed for severe pain. (Patient not taking: Reported on 02/20/2020) 30 tablet 0   Current Facility-Administered Medications on  File Prior to Visit  Medication Dose Route Frequency Provider Last Rate Last Admin  . 0.9 %  sodium chloride infusion  500 mL Intravenous Once Danis, Estill Cotta III, MD       ROS as in subjective  Objective: BP 120/74   Pulse 80   Temp 98.4 F (36.9 C)   Wt 244 lb (110.7 kg)   BMI 35.01 kg/m   Gen: wd, wn, nad Heart rrr, normal s1, s2, no murmurs Lungs clear Ext: no edema Pulse WNL    Assessment: Encounter Diagnoses  Name Primary?  . Essential hypertension Yes  . Elevated serum creatinine   . Elevated glucose   . S/P hip replacement, right      Plan Hypertension-continue current medication.  May consider changing medication if kidney marker still comes back elevated  Elevated glucose, elevated serum creatinine-recheck labs today.  May need to change blood pressure medication if kidney marker still elevated  Status post recent hip replacement.  Improving.  Continue physical therapy.  Follow-up with Dr. Mayer Camel as planned  I wished him happy birthday today  Audwin was seen today for other.  Diagnoses and all orders for this visit:  Essential hypertension -     Basic metabolic panel  Elevated serum creatinine -     Basic metabolic panel  Elevated glucose  S/P hip replacement, right  f/u pending labs

## 2020-02-21 LAB — BASIC METABOLIC PANEL
BUN/Creatinine Ratio: 12 (ref 10–24)
BUN: 14 mg/dL (ref 8–27)
CO2: 23 mmol/L (ref 20–29)
Calcium: 9.9 mg/dL (ref 8.6–10.2)
Chloride: 100 mmol/L (ref 96–106)
Creatinine, Ser: 1.21 mg/dL (ref 0.76–1.27)
GFR calc Af Amer: 73 mL/min/{1.73_m2} (ref >59)
GFR calc non Af Amer: 63 mL/min/{1.73_m2} (ref >59)
Glucose: 138 mg/dL — ABNORMAL HIGH (ref 65–99)
Potassium: 3.9 mmol/L (ref 3.5–5.2)
Sodium: 137 mmol/L (ref 134–144)

## 2020-05-07 ENCOUNTER — Other Ambulatory Visit: Payer: Self-pay | Admitting: Medical

## 2020-09-29 ENCOUNTER — Other Ambulatory Visit: Payer: Self-pay | Admitting: Medical

## 2020-11-20 ENCOUNTER — Telehealth: Payer: Self-pay

## 2020-11-20 ENCOUNTER — Other Ambulatory Visit: Payer: Self-pay

## 2020-11-20 MED ORDER — SILDENAFIL CITRATE 20 MG PO TABS: ORAL_TABLET | ORAL | 1 refills | Status: DC

## 2020-11-20 NOTE — Telephone Encounter (Signed)
Pt. Scheduled for CPE in August and med refilled.

## 2020-11-20 NOTE — Telephone Encounter (Signed)
Pt. Called stating that he needs a refill on his sildenafil to [facility] on [address]. Pt. Last apt was 02/20/20.

## 2020-11-22 ENCOUNTER — Telehealth: Payer: Self-pay

## 2020-11-22 NOTE — Telephone Encounter (Signed)
P.A. SILDENAFIL  

## 2020-12-11 NOTE — Telephone Encounter (Signed)
P.A. denied not covered for ED.  Called pt and he has Good Rx card to use

## 2021-01-11 ENCOUNTER — Other Ambulatory Visit: Payer: Self-pay | Admitting: Medical

## 2021-01-12 ENCOUNTER — Other Ambulatory Visit: Payer: Self-pay | Admitting: Medical

## 2021-01-21 ENCOUNTER — Encounter: Payer: BC Managed Care – PPO | Admitting: Medical

## 2021-02-17 ENCOUNTER — Other Ambulatory Visit: Payer: Self-pay

## 2021-02-17 ENCOUNTER — Encounter: Payer: Self-pay | Admitting: Medical

## 2021-02-17 ENCOUNTER — Ambulatory Visit (INDEPENDENT_AMBULATORY_CARE_PROVIDER_SITE_OTHER): Payer: BC Managed Care – PPO | Admitting: Medical

## 2021-02-17 VITALS — BP 128/78 | HR 81 | Ht 71.0 in | Wt 254.4 lb

## 2021-02-17 DIAGNOSIS — M47816 Spondylosis without myelopathy or radiculopathy, lumbar region: Secondary | ICD-10-CM

## 2021-02-17 DIAGNOSIS — D369 Benign neoplasm, unspecified site: Secondary | ICD-10-CM

## 2021-02-17 DIAGNOSIS — Z0000 Encounter for general adult medical examination without abnormal findings: Secondary | ICD-10-CM | POA: Diagnosis not present

## 2021-02-17 DIAGNOSIS — N401 Enlarged prostate with lower urinary tract symptoms: Secondary | ICD-10-CM

## 2021-02-17 DIAGNOSIS — Z96641 Presence of right artificial hip joint: Secondary | ICD-10-CM

## 2021-02-17 DIAGNOSIS — M25659 Stiffness of unspecified hip, not elsewhere classified: Secondary | ICD-10-CM

## 2021-02-17 DIAGNOSIS — Z131 Encounter for screening for diabetes mellitus: Secondary | ICD-10-CM

## 2021-02-17 DIAGNOSIS — Z125 Encounter for screening for malignant neoplasm of prostate: Secondary | ICD-10-CM

## 2021-02-17 DIAGNOSIS — E785 Hyperlipidemia, unspecified: Secondary | ICD-10-CM | POA: Diagnosis not present

## 2021-02-17 DIAGNOSIS — Z1211 Encounter for screening for malignant neoplasm of colon: Secondary | ICD-10-CM

## 2021-02-17 DIAGNOSIS — M1612 Unilateral primary osteoarthritis, left hip: Secondary | ICD-10-CM

## 2021-02-17 DIAGNOSIS — I10 Essential (primary) hypertension: Secondary | ICD-10-CM

## 2021-02-17 DIAGNOSIS — Z23 Encounter for immunization: Secondary | ICD-10-CM

## 2021-02-17 DIAGNOSIS — N529 Male erectile dysfunction, unspecified: Secondary | ICD-10-CM

## 2021-02-17 DIAGNOSIS — E669 Obesity, unspecified: Secondary | ICD-10-CM

## 2021-02-17 LAB — POCT URINALYSIS DIP (PROADVANTAGE DEVICE)
Bilirubin, UA: NEGATIVE
Blood, UA: NEGATIVE
Glucose, UA: NEGATIVE mg/dL
Ketones, POC UA: NEGATIVE mg/dL
Leukocytes, UA: NEGATIVE
Nitrite, UA: NEGATIVE
Protein Ur, POC: NEGATIVE mg/dL
Specific Gravity, Urine: 1.02
Urobilinogen, Ur: NEGATIVE
pH, UA: 6 (ref 5.0–8.0)

## 2021-02-17 NOTE — Progress Notes (Signed)
Subjective:   HPI  Wesley Lawson is a 65 y.o. male who presents for Chief Complaint  Patient presents with   fasting cpe    Fasting cpe, no other concerns, flu shot given today    Patient Care Team: Coyle Stordahl, Leward Quan as PCP - General (Family Medicine) Sees dentist Sees eye doctor  Concerns: Low back pain x  few days.   No injury, no trauma, no fall, no pain radiating down legs, no paresthesias of legs, no fever, no belly pain or urinary issues, no bowel c/o  Has gained weight this past year.    Retired this month.    Wants flu shot today  Reviewed their medical, surgical, family, social, medication, and allergy history and updated chart as appropriate.  Past Medical History:  Diagnosis Date   Abnormal EKG    Allergy    Arthritis    hands, knees   Chest pain    a. normal echo stress test in 2010 with mild to moderate LVH.    Dyslipidemia    History of exercise stress test 08/2016   normal   Hyperlipidemia    Hypertension     Past Surgical History:  Procedure Laterality Date   COLONOSCOPY  07/21/2017   tubular adenoma, Dr. Wilfrid Lund   TOTAL HIP ARTHROPLASTY Right 01/21/2020   Procedure: RIGHT TOTAL HIP ARTHROPLASTY ANTERIOR APPROACH;  Surgeon: Frederik Pear, MD;  Location: [facility];  Service: Orthopedics;  Laterality: Right;   WISDOM TOOTH EXTRACTION      Family History  Problem Relation Age of Onset   Diabetes Mother    Stroke Mother    Diabetes Sister    Asthma Daughter    Colon polyps Neg Hx    Esophageal cancer Neg Hx    Rectal cancer Neg Hx    Stomach cancer Neg Hx    Cancer Neg Hx    Heart disease Neg Hx    Colon cancer Neg Hx    Liver cancer Neg Hx    Pancreatic cancer Neg Hx      Current Outpatient Medications:    acetaminophen (TYLENOL) 325 MG tablet, Take 650 mg by mouth every 6 (six) hours as needed for moderate pain. , Disp: , Rfl:    aspirin EC 81 MG tablet, Take 1 tablet (81 mg total) by mouth 2 (two) times daily., Disp: 60  tablet, Rfl: 0   lisinopril-hydrochlorothiazide (ZESTORETIC) 20-25 MG tablet, TAKE 1 TABLET BY MOUTH DAILY, Disp: 90 tablet, Rfl: 0   pravastatin (PRAVACHOL) 20 MG tablet, Take 1 tablet (20 mg total) by mouth every evening., Disp: 90 tablet, Rfl: 3   sildenafil (REVATIO) 20 MG tablet, TAKE 1 TO 5 TABLETS(20 TO 100 MG) BY MOUTH DAILY AS NEEDED, Disp: 50 tablet, Rfl: 1  No Known Allergies   Review of Systems Constitutional: -fever, -chills, -sweats, -unexpected weight change, -decreased appetite, -fatigue Allergy: -sneezing, -itching, -congestion Dermatology: -changing moles, --rash, -lumps ENT: -runny nose, -ear pain, -sore throat, -hoarseness, -sinus pain, -teeth pain, - ringing in ears, -hearing loss, -nosebleeds Cardiology: -chest pain, -palpitations, -swelling, -difficulty breathing when lying flat, -waking up short of breath Respiratory: -cough, -shortness of breath, -difficulty breathing with exercise or exertion, -wheezing, -coughing up blood Gastroenterology: -abdominal pain, -nausea, -vomiting, -diarrhea, -constipation, -blood in stool, -changes in bowel movement, -difficulty swallowing or eating Hematology: -bleeding, -bruising  Musculoskeletal: -joint aches, -muscle aches, -joint swelling, +back pain, -neck pain, -cramping, -changes in gait Ophthalmology: denies vision changes, eye redness, itching, discharge Urology: -burning  with urination, -difficulty urinating, -blood in urine, -urinary frequency, -urgency, -incontinence Neurology: -headache, -weakness, -tingling, -numbness, -memory loss, -falls, -dizziness Psychology: -depressed mood, -agitation, -sleep problems Male GU: no testicular mass, pain, no lymph nodes swollen, no swelling, no rash.  Depression screen [facility] 2/9 02/17/2021 05/17/2019 06/13/2017 02/21/2014  Decreased Interest 0 0 0 0  Down, Depressed, Hopeless 0 0 0 0  PHQ - 2 Score 0 0 0 0        Objective:  BP 128/78   Pulse 81   Ht '5\' 11"'$  (1.803 m)   Wt 254 lb  6.4 oz (115.4 kg)   BMI 35.48 kg/m   General appearance: alert, no distress, WD/WN, African American male Skin: unremarkable HEENT: normocephalic, conjunctiva/corneas normal, sclerae anicteric, PERRLA, EOMi, nares patent, no discharge or erythema, pharynx normal Oral cavity: MMM, tongue normal, teeth normal Neck: supple, no lymphadenopathy, no thyromegaly, no masses, normal ROM, no bruits Chest: non tender, normal shape and expansion Heart: RRR, normal S1, S2, no murmurs Lungs: CTA bilaterally, no wheezes, rhonchi, or rales Abdomen: +bs, soft, non tender, non distended, no masses, no hepatomegaly, no splenomegaly, no bruits Back: Relatively normal range of motion.  No obvious point tenderness. , normal ROM, no scoliosis Musculoskeletal:  Upper extremities non tender, no obvious deformity, normal ROM throughout, lower extremities non tender, no obvious deformity, normal ROM throughout Extremities: no edema, no cyanosis, no clubbing Pulses: 2+ symmetric, upper and lower extremities, normal cap refill Neurological: alert, oriented x 3, CN2-12 intact, strength normal upper extremities and lower extremities, sensation normal throughout, DTRs 2+ throughout, no cerebellar signs, gait normal Psychiatric: normal affect, behavior normal, pleasant  GU: normal male external genitalia,circumcised, nontender, no masses, no hernia, no lymphadenopathy Rectal: nontender, normal tone, prostate moderately enlarged, no nodules   Assessment and Plan :   Encounter Diagnoses  Name Primary?   Encounter for health maintenance examination in adult Yes   Needs flu shot    Essential hypertension    Hyperlipidemia, unspecified hyperlipidemia type    Primary osteoarthritis of left hip    S/P hip replacement, right    Screen for colon cancer    Screening for diabetes mellitus    Screening for prostate cancer    Spondylosis of lumbar spine    Tubular adenoma    Benign prostatic hyperplasia with lower urinary  tract symptoms, symptom details unspecified    Decreased range of hip movement, unspecified laterality    Erectile dysfunction, unspecified erectile dysfunction type    Obesity with serious comorbidity, unspecified classification, unspecified obesity type     This visit was a preventative care visit, also known as wellness visit or routine physical.   Topics typically include healthy lifestyle, diet, exercise, preventative care, vaccinations, sick and well care, proper use of emergency dept and after hours care, as well as other concerns.     Recommendations: Continue to return yearly for your annual wellness and preventative care visits.  This gives [place] a chance to discuss healthy lifestyle, exercise, vaccinations, review your chart record, and perform screenings where appropriate.  I recommend you see your eye doctor yearly for routine vision care.  I recommend you see your dentist yearly for routine dental care including hygiene visits twice yearly.   Vaccination recommendations were reviewed Immunization History  Administered Date(s) Administered   Influenza,inj,Quad PF,6+ Mos 04/01/2013, 05/16/2017, 05/02/2018, 02/14/2019, 02/17/2021   Influenza,inj,quad, With Preservative 05/02/2018   Influenza-Unspecified 05/16/2017   PFIZER(Purple Top)SARS-COV-2 Vaccination 08/25/2019, 09/22/2019   Pneumococcal Polysaccharide-23 05/17/2019  Tdap 06/13/2017   Counseled on the influenza virus vaccine.  Vaccine information sheet given.  Influenza vaccine given after consent obtained.  Shingles vaccine:  I recommend you have a shingles vaccine to help prevent shingles or herpes zoster outbreak.   Please call your insurer to inquire about coverage for the Shingrix vaccine given in 2 doses.   Some insurers cover this vaccine after age 62, some cover this after age 67.  If your insurer covers this, then call to schedule appointment to have this vaccine here.   Screening for cancer: Colon cancer  screening: I reviewed your colonoscopy on file that is up to date from 2019  We discussed PSA, prostate exam, and prostate cancer screening risks/benefits.     Skin cancer screening: Check your skin regularly for new changes, growing lesions, or other lesions of concern Come in for evaluation if you have skin lesions of concern.  Lung cancer screening: If you have a greater than 20 pack year history of tobacco use, then you may qualify for lung cancer screening with a chest CT scan.   Please call your insurance company to inquire about coverage for this test.  We currently don't have screenings for other cancers besides breast, cervical, colon, and lung cancers.  If you have a strong family history of cancer or have other cancer screening concerns, please let me know.    Bone health: Get at least 150 minutes of aerobic exercise weekly Get weight bearing exercise at least once weekly Bone density test:  A bone density test is an imaging test that uses a type of X-ray to measure the amount of calcium and other minerals in your bones. The test may be used to diagnose or screen you for a condition that causes weak or thin bones (osteoporosis), predict your risk for a broken bone (fracture), or determine how well your osteoporosis treatment is working. The bone density test is recommended for females 60 and older, or females or males [id number] if certain risk factors such as thyroid disease, long term use of steroids such as for asthma or rheumatological issues, vitamin D deficiency, estrogen deficiency, family history of osteoporosis, self or family history of fragility fracture in first degree relative.    Heart health: Get at least 150 minutes of aerobic exercise weekly Limit alcohol It is important to maintain a healthy blood pressure and healthy cholesterol numbers  Heart disease screening: Screening for heart disease includes screening for blood pressure, fasting lipids, glucose/diabetes  screening, BMI height to weight ratio, reviewed of smoking status, physical activity, and diet.    Goals include blood pressure 120/80 or less, maintaining a healthy lipid/cholesterol profile, preventing diabetes or keeping diabetes numbers under good control, not smoking or using tobacco products, exercising most days per week or at least 150 minutes per week of exercise, and eating healthy variety of fruits and vegetables, healthy oils, and avoiding unhealthy food choices like fried food, fast food, high sugar and high cholesterol foods.    Other tests may possibly include EKG test, CT coronary calcium score, echocardiogram, exercise treadmill stress test.    Medical care options: I recommend you continue to seek care here first for routine care.  We try really hard to have available appointments Monday through Friday daytime hours for sick visits, acute visits, and physicals.  Urgent care should be used for after hours and weekends for significant issues that cannot wait till the next day.  The emergency department should be used for significant potentially  life-threatening emergencies.  The emergency department is expensive, can often have long wait times for less significant concerns, so try to utilize primary care, urgent care, or telemedicine when possible to avoid unnecessary trips to the emergency department.  Virtual visits and telemedicine have been introduced since the pandemic started in 2020, and can be convenient ways to receive medical care.  We offer virtual appointments as well to assist you in a variety of options to seek medical care.   Advanced Directives: I recommend you consider completing a [place] and Living Will.   These documents respect your wishes and help alleviate burdens on your loved ones if you were to become terminally ill or be in a position to need those documents enforced.    You can complete Advanced Directives yourself, have them notarized,  then have copies made for our office, for you and for anybody you feel should have them in safe keeping.  Or, you can have an attorney prepare these documents.   If you haven't updated your Last Will and Testament in a while, it may be worthwhile having an attorney prepare these documents together and save on some costs.       Separate significant issues discussed: Low back pain - UA normal.  Likely musculoskeletal related.  Work on stretching, exercise, and losing weight  Obesity - work on losing weight through lifestyle changes  Hip OA - work on stretching.    If worsening, then recheck  Hypertension - continue current medication  Hyperlipidemia - continue current medication,   ED - gets some headaches on sildenafil.   Otherwise works fine for him  Anfrenee was seen today for fasting cpe.  Diagnoses and all orders for this visit:  Encounter for health maintenance examination in adult -     Comprehensive metabolic panel -     CBC -     PSA -     Hemoglobin A1c -     Lipid panel -     Cancel: Urinalysis -     POCT Urinalysis DIP (Proadvantage Device)  Needs flu shot -     Flu Vaccine QUAD 68moIM (Fluarix, Fluzone & Alfiuria Quad PF)  Essential hypertension  Hyperlipidemia, unspecified hyperlipidemia type -     Lipid panel  Primary osteoarthritis of left hip  S/P hip replacement, right  Screen for colon cancer  Screening for diabetes mellitus -     Hemoglobin A1c  Screening for prostate cancer -     PSA  Spondylosis of lumbar spine  Tubular adenoma  Benign prostatic hyperplasia with lower urinary tract symptoms, symptom details unspecified -     PSA  Decreased range of hip movement, unspecified laterality  Erectile dysfunction, unspecified erectile dysfunction type  Obesity with serious comorbidity, unspecified classification, unspecified obesity type   Follow-up pending labs, yearly for physical

## 2021-02-18 ENCOUNTER — Other Ambulatory Visit: Payer: Self-pay | Admitting: Medical

## 2021-02-18 DIAGNOSIS — E785 Hyperlipidemia, unspecified: Secondary | ICD-10-CM

## 2021-02-18 LAB — COMPREHENSIVE METABOLIC PANEL
ALT: 34 IU/L (ref 0–44)
AST: 21 IU/L (ref 0–40)
Albumin/Globulin Ratio: 2.1 (ref 1.2–2.2)
Albumin: 4.9 g/dL — ABNORMAL HIGH (ref 3.8–4.8)
Alkaline Phosphatase: 83 IU/L (ref 44–121)
BUN/Creatinine Ratio: 11 (ref 10–24)
BUN: 13 mg/dL (ref 8–27)
Bilirubin Total: 0.8 mg/dL (ref 0.0–1.2)
CO2: 23 mmol/L (ref 20–29)
Calcium: 10.4 mg/dL — ABNORMAL HIGH (ref 8.6–10.2)
Chloride: 99 mmol/L (ref 96–106)
Creatinine, Ser: 1.14 mg/dL (ref 0.76–1.27)
Globulin, Total: 2.3 g/dL (ref 1.5–4.5)
Glucose: 97 mg/dL (ref 65–99)
Potassium: 4.2 mmol/L (ref 3.5–5.2)
Sodium: 138 mmol/L (ref 134–144)
Total Protein: 7.2 g/dL (ref 6.0–8.5)
eGFR: 72 mL/min/{1.73_m2} (ref >59)

## 2021-02-18 LAB — CBC
Hematocrit: 44.8 % (ref 37.5–51.0)
Hemoglobin: 15 g/dL (ref 13.0–17.7)
MCH: 29.2 pg (ref 26.6–33.0)
MCHC: 33.5 g/dL (ref 31.5–35.7)
MCV: 87 fL (ref 79–97)
Platelets: 197 10*3/uL (ref 150–450)
RBC: 5.14 x10E6/uL (ref 4.14–5.80)
RDW: 13.4 % (ref 11.6–15.4)
WBC: 4.3 10*3/uL (ref 3.4–10.8)

## 2021-02-18 LAB — HEMOGLOBIN A1C
Est. average glucose Bld gHb Est-mCnc: 128 mg/dL
Hgb A1c MFr Bld: 6.1 % — ABNORMAL HIGH (ref 4.8–5.6)

## 2021-02-18 LAB — LIPID PANEL
Chol/HDL Ratio: 5.5 ratio — ABNORMAL HIGH (ref 0.0–5.0)
Cholesterol, Total: 227 mg/dL — ABNORMAL HIGH (ref 100–199)
HDL: 41 mg/dL (ref >39)
LDL Chol Calc (NIH): 138 mg/dL — ABNORMAL HIGH (ref 0–99)
Triglycerides: 264 mg/dL — ABNORMAL HIGH (ref 0–149)
VLDL Cholesterol Cal: 48 mg/dL — ABNORMAL HIGH (ref 5–40)

## 2021-02-18 LAB — PSA: Prostate Specific Ag, Serum: 1.7 ng/mL (ref 0.0–4.0)

## 2021-02-18 MED ORDER — PRAVASTATIN SODIUM 20 MG PO TABS: 20.0000 mg | ORAL_TABLET | Freq: Every evening | ORAL | 3 refills | Status: DC

## 2021-02-18 MED ORDER — SILDENAFIL CITRATE 20 MG PO TABS: ORAL_TABLET | ORAL | 4 refills | Status: DC

## 2021-02-18 MED ORDER — ASPIRIN EC 81 MG PO TBEC: 81.0000 mg | DELAYED_RELEASE_TABLET | Freq: Every day | ORAL | 3 refills | Status: DC

## 2021-02-18 MED ORDER — LISINOPRIL-HYDROCHLOROTHIAZIDE 20-25 MG PO TABS: 1.0000 | ORAL_TABLET | Freq: Every day | ORAL | 3 refills | Status: DC

## 2021-05-06 ENCOUNTER — Other Ambulatory Visit: Payer: Self-pay | Admitting: Medical

## 2021-05-06 NOTE — Telephone Encounter (Signed)
Refill if he is requesting this

## 2021-07-30 ENCOUNTER — Other Ambulatory Visit: Payer: Self-pay | Admitting: Medical

## 2021-08-29 ENCOUNTER — Encounter (HOSPITAL_COMMUNITY): Payer: Self-pay | Admitting: *Deleted

## 2021-08-29 ENCOUNTER — Ambulatory Visit (HOSPITAL_COMMUNITY)
Admission: EM | Admit: 2021-08-29 | Discharge: 2021-08-29 | Disposition: A | Discharge status: Home / Self Care | Payer: Medicare Other | Attending: Nurse Practitioner | Admitting: Nurse Practitioner

## 2021-08-29 ENCOUNTER — Other Ambulatory Visit: Payer: Self-pay

## 2021-08-29 DIAGNOSIS — J029 Acute pharyngitis, unspecified: Secondary | ICD-10-CM

## 2021-08-29 DIAGNOSIS — J069 Acute upper respiratory infection, unspecified: Secondary | ICD-10-CM | POA: Diagnosis present

## 2021-08-29 DIAGNOSIS — Z20822 Contact with and (suspected) exposure to covid-19: Secondary | ICD-10-CM | POA: Diagnosis not present

## 2021-08-29 LAB — POCT RAPID STREP A, ED / UC: Streptococcus, Group A Screen (Direct): NEGATIVE

## 2021-08-29 LAB — POC INFLUENZA A AND B ANTIGEN (URGENT CARE ONLY)
INFLUENZA A ANTIGEN, POC: NEGATIVE
INFLUENZA B ANTIGEN, POC: NEGATIVE

## 2021-08-29 MED ORDER — IBUPROFEN 800 MG PO TABS
ORAL_TABLET | ORAL | Status: AC
  Filled 2021-08-29: qty 1

## 2021-08-29 MED ORDER — IBUPROFEN 800 MG PO TABS
800.0000 mg | ORAL_TABLET | Freq: Once | ORAL | Status: AC
  Administered 2021-08-29: 800 mg via ORAL

## 2021-08-29 MED ORDER — AMOXICILLIN 500 MG PO CAPS: 500.0000 mg | ORAL_CAPSULE | Freq: Two times a day (BID) | ORAL | 0 refills | Status: AC

## 2021-08-29 MED ORDER — PREDNISONE 20 MG PO TABS: 20.0000 mg | ORAL_TABLET | Freq: Every day | ORAL | 0 refills | Status: AC

## 2021-08-29 MED ORDER — PROMETHAZINE-DM 6.25-15 MG/5ML PO SYRP: 5.0000 mL | ORAL_SOLUTION | Freq: Four times a day (QID) | ORAL | 0 refills | Status: AC | PRN

## 2021-08-29 NOTE — Discharge Instructions (Addendum)
Your influenza and strep test were negative today.  I have sent a throat culture for confirmatory testing.  You will be notified if your result is positive. ?Take medication as prescribed. ?Recommend ibuprofen or Tylenol for pain, fever, or general discomfort. ?Warm salt water gargles 3-4 times daily until symptoms improve. ?Recommend supportive care to include in place increasing fluids and getting plenty of rest. ?You should remain home until you have been fever free for at least 24 hours without the use of medication. ?Follow-up if symptoms do not improve. ? ?

## 2021-08-29 NOTE — ED Provider Notes (Signed)
?[place] ? ? ? ?CSN: [id number] ?Arrival date & time: 08/29/21  1035 ? ? ?  ? ?History   ?Chief Complaint ?Chief Complaint  ?Patient presents with  ? Cough  ? Sore Throat  ? ? ?HPI ?AKARI CRYSLER is a 66 y.o. male.  ? ?The patient is a 66 year old male who presents with cough and sore throat.  Patient states his symptoms started approximately 5 days ago with hoarseness, he states approximately 2 to 3 days later he developed the coughing and sore throat.  He states he feels like his throat is "closing".  He states that the cough is productive of yellow sputum.  He states that he does work at the [employer] and has been at the basketball tournaments working, even up until last night.  States that he has been taking several over-the-counter medications such as cough medication, Benadryl, pain medication, with no relief.  The patient has received 2 COVID vaccines. ? ? ?Cough ?Associated symptoms: fever and sore throat   ?Associated symptoms: no rhinorrhea, no shortness of breath and no wheezing   ?Sore Throat ?Pertinent negatives include no shortness of breath.  ? ?Past Medical History:  ?Diagnosis Date  ? Abnormal EKG   ? Allergy   ? Arthritis   ? hands, knees  ? Chest pain   ? a. normal echo stress test in 2010 with mild to moderate LVH.   ? Dyslipidemia   ? History of exercise stress test 08/2016  ? normal  ? Hyperlipidemia   ? Hypertension   ? ? ?Patient Active Problem List  ? Diagnosis Date Noted  ? Encounter for health maintenance examination in adult 02/17/2021  ? Needs flu shot 02/17/2021  ? S/P hip replacement, right 02/20/2020  ? Osteoarthritis of right hip 07/23/2019  ? Need for pneumococcal vaccination 05/17/2019  ? Benign prostatic hyperplasia with lower urinary tract symptoms 05/15/2019  ? Low testosterone 05/15/2019  ? Lipoma 04/11/2019  ? Tubular adenoma 11/02/2017  ? Screen for colon cancer 06/13/2017  ? Screening for prostate cancer 06/13/2017  ? Screening for diabetes mellitus  06/13/2017  ? Hyperlipidemia 07/30/2016  ? Erectile dysfunction 07/30/2016  ? Spondylosis of lumbar spine 07/30/2016  ? Primary osteoarthritis of left hip 07/30/2016  ? Obesity 10/13/2015  ? Essential hypertension 10/13/2015  ? Decreased range of hip movement 10/13/2015  ? ? ?Past Surgical History:  ?Procedure Laterality Date  ? COLONOSCOPY  07/21/2017  ? tubular adenoma, Dr. Wilfrid Lund  ? TOTAL HIP ARTHROPLASTY Right 01/21/2020  ? Procedure: RIGHT TOTAL HIP ARTHROPLASTY ANTERIOR APPROACH;  Surgeon: Frederik Pear, MD;  Location: [facility];  Service: Orthopedics;  Laterality: Right;  ? WISDOM TOOTH EXTRACTION    ? ? ? ? ? ?Home Medications   ? ?Prior to Admission medications   ?Medication Sig Start Date End Date Taking? Authorizing Provider  ?valACYclovir (VALTREX) 1000 MG tablet TAKE 2 TABLETS BY MOUTH 2 TIMES DAILY(FOR 1 DAY FOR COLD SORES OR 3 TIMES DAILY FOR 1 WEEK FOR SHINGLES 05/06/21   Tysinger, Camelia Eng, PA-C  ?acetaminophen (TYLENOL) 325 MG tablet Take 650 mg by mouth every 6 (six) hours as needed for moderate pain.     [provider]  ?aspirin EC 81 MG tablet Take 1 tablet (81 mg total) by mouth daily. 02/18/21   Tysinger, Camelia Eng, PA-C  ?lisinopril-hydrochlorothiazide (ZESTORETIC) 20-25 MG tablet Take 1 tablet by mouth daily. 02/18/21   Tysinger, Camelia Eng, PA-C  ?pravastatin (PRAVACHOL) 20 MG tablet Take 1  tablet (20 mg total) by mouth every evening. 02/18/21 02/18/22  Tysinger, Camelia Eng, PA-C  ?sildenafil (REVATIO) 20 MG tablet TAKE 1 TO 5 TABLETS(20 TO 100 MG) BY MOUTH DAILY AS NEEDED 07/30/21   Tysinger, Camelia Eng, PA-C  ? ? ?Family History ?Family History  ?Problem Relation Age of Onset  ? Diabetes Mother   ? Stroke Mother   ? Diabetes Sister   ? Asthma Daughter   ? Colon polyps Neg Hx   ? Esophageal cancer Neg Hx   ? Rectal cancer Neg Hx   ? Stomach cancer Neg Hx   ? Cancer Neg Hx   ? Heart disease Neg Hx   ? Colon cancer Neg Hx   ? Liver cancer Neg Hx   ? Pancreatic cancer Neg Hx   ? ? ?Social  History ?Social History  ? ?Tobacco Use  ? Smoking status: Never  ? Smokeless tobacco: Never  ?Vaping Use  ? Vaping Use: Never used  ?Substance Use Topics  ? Alcohol use: Yes  ?  Alcohol/week: 3.0 standard drinks  ?  Types: 3 Cans of beer per week  ?  Comment: once a week  ? Drug use: No  ? ? ? ?Allergies   ?Patient has no known allergies. ? ? ?Review of Systems ?Review of Systems  ?Constitutional:  Positive for fatigue and fever. Negative for activity change and appetite change.  ?HENT:  Positive for congestion, postnasal drip, sore throat and trouble swallowing. Negative for facial swelling, rhinorrhea, sinus pressure and sinus pain.   ?Eyes: Negative.   ?Respiratory:  Positive for cough. Negative for shortness of breath and wheezing.   ?Cardiovascular: Negative.   ?Gastrointestinal: Negative.   ?Skin: Negative.   ?Psychiatric/Behavioral: Negative.    ? ? ?Physical Exam ?Triage Vital Signs ?ED Triage Vitals  ?Enc Vitals Group  ?   BP 08/29/21 1250 (!) 164/102  ?   Pulse Rate 08/29/21 1250 88  ?   Resp 08/29/21 1250 18  ?   Temp 08/29/21 1250 (!) 100.6 ?F (38.1 ?C)  ?   Temp src --   ?   SpO2 08/29/21 1250 95 %  ?   Weight --   ?   Height --   ?   Head Circumference --   ?   Peak Flow --   ?   Pain Score 08/29/21 1248 10  ?   Pain Loc --   ?   Pain Edu? --   ?   Excl. in [place]? --   ? ?No data found. ? ?Updated Vital Signs ?BP (!) 164/102   Pulse 88   Temp (!) 100.6 ?F (38.1 ?C)   Resp 18   SpO2 95%  ? ?Visual Acuity ?Right Eye Distance:   ?Left Eye Distance:   ?Bilateral Distance:   ? ?Right Eye Near:   ?Left Eye Near:    ?Bilateral Near:    ? ?Physical Exam ?Vitals reviewed.  ?Constitutional:   ?   General: He is not in acute distress. ?   Appearance: He is well-developed.  ?HENT:  ?   Head: Normocephalic and atraumatic.  ?   Right Ear: Tympanic membrane and ear canal normal. No middle ear effusion. Tympanic membrane is not erythematous.  ?   Left Ear: Tympanic membrane and ear canal normal.  No middle ear  effusion. Tympanic membrane is not erythematous.  ?   Nose: Congestion present. No rhinorrhea.  ?   Mouth/Throat:  ?   Mouth: Mucous  membranes are moist.  ?   Pharynx: Pharyngeal swelling, posterior oropharyngeal erythema and uvula swelling present.  ?   Tonsils: No tonsillar exudate. 2+ on the right. 2+ on the left.  ?Eyes:  ?   Conjunctiva/sclera: Conjunctivae normal.  ?   Pupils: Pupils are equal, round, and reactive to light.  ?Cardiovascular:  ?   Rate and Rhythm: Normal rate and regular rhythm.  ?   Heart sounds: Normal heart sounds.  ?Pulmonary:  ?   Effort: Pulmonary effort is normal.  ?   Breath sounds: Normal breath sounds. No wheezing or rales.  ?Abdominal:  ?   General: Bowel sounds are normal.  ?   Palpations: Abdomen is soft.  ?   Tenderness: There is no abdominal tenderness.  ?Musculoskeletal:  ?   Cervical back: Normal range of motion.  ?Lymphadenopathy:  ?   Cervical: Cervical adenopathy (left) present.  ?Skin: ?   General: Skin is warm and dry.  ?Neurological:  ?   General: No focal deficit present.  ?   Mental Status: He is alert and oriented to person, place, and time.  ?Psychiatric:     ?   Mood and Affect: Mood normal.     ?   Behavior: Behavior normal.  ? ? ? ?UC Treatments / Results  ?Labs ?(all labs ordered are listed, but only abnormal results are displayed) ?Labs Reviewed  ?SARS CORONAVIRUS 2 (TAT 6-24 HRS)  ?CULTURE, GROUP A STREP [facility])  ?POC INFLUENZA A AND B ANTIGEN (URGENT CARE ONLY)  ?POCT RAPID STREP A, ED / UC  ? ? ?EKG ? ? ?Radiology ?No results found. ? ?Procedures ?Procedures (including critical care time) ? ?Medications Ordered in UC ?Medications  ?ibuprofen (ADVIL) tablet 800 mg (800 mg Oral Given 08/29/21 1312)  ? ? ?Initial Impression / Assessment and Plan / UC Course  ?I have reviewed the triage vital signs and the nursing notes. ? ?Pertinent labs & imaging results that were available during my care of the patient were reviewed by me and considered in my medical decision  making (see chart for details). ? ?The patient is a 66 year old male who presents with cough and sore throat for the past 5 days.  Patient states symptoms have worsened over the past 2 to 3 days.  He complains of moderate thro

## 2021-08-29 NOTE — ED Triage Notes (Signed)
Pt reports since Monday he has had a sore throat and cough.  ?

## 2021-08-30 LAB — SARS CORONAVIRUS 2 (TAT 6-24 HRS): SARS Coronavirus 2: NEGATIVE

## 2021-09-01 LAB — CULTURE, GROUP A STREP (THRC)

## 2021-09-13 IMAGING — DX DG CHEST 1V PORT
1 series · 1 of 1 positions shown · non-contrast
Comparison: 06/05/2015

CLINICAL DATA: Patient tested positive for COVID on [REDACTED]. Patient
reports SOB when he coughs. PT HX: HTN, ex smoker

EXAM:
PORTABLE CHEST 1 VIEW

[chest ap]
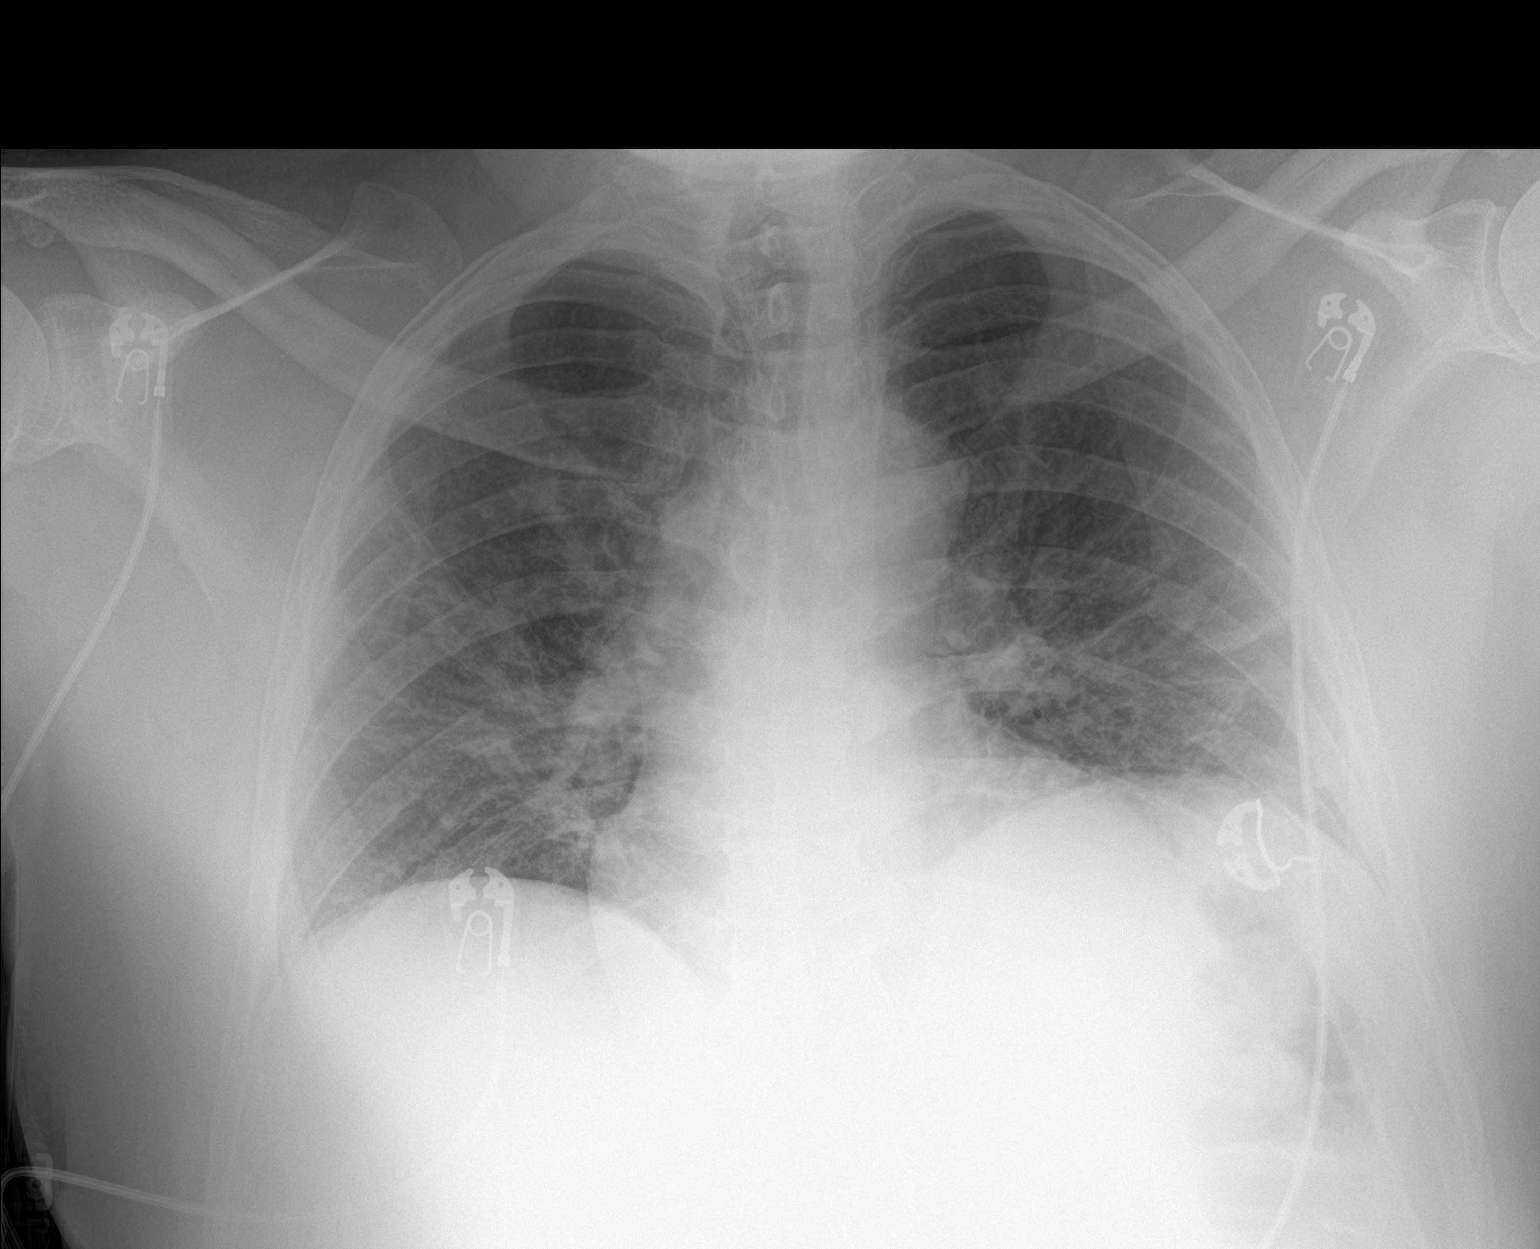

[1 of 1 positions shown; findings below may reference images not displayed]

FINDINGS: Bilateral interstitial and hazy airspace lung opacities are noted in
the mid and lower lungs, new from the prior exam, consistent with
multifocal VIG09-40 pneumonia.

No pleural effusion or pneumothorax.

Cardiac silhouette is normal in size. No mediastinal or hilar
masses.

Skeletal structures are grossly intact.
IMPRESSION: 1. Bilateral mid and lower lung zone interstitial and hazy airspace
lung opacities consistent with multifocal VIG09-40 pneumonia.

## 2021-09-24 ENCOUNTER — Other Ambulatory Visit: Payer: Self-pay | Admitting: Medical

## 2021-12-10 ENCOUNTER — Other Ambulatory Visit: Payer: Self-pay | Admitting: Medical

## 2021-12-25 ENCOUNTER — Other Ambulatory Visit: Payer: Self-pay | Admitting: Medical

## 2022-02-17 ENCOUNTER — Encounter: Payer: Self-pay | Admitting: Internal Medicine

## 2022-02-22 ENCOUNTER — Ambulatory Visit (INDEPENDENT_AMBULATORY_CARE_PROVIDER_SITE_OTHER): Payer: Medicare Other | Admitting: Medical

## 2022-02-22 VITALS — BP 124/82 | HR 63 | Ht 71.25 in | Wt 254.8 lb

## 2022-02-22 DIAGNOSIS — Z23 Encounter for immunization: Secondary | ICD-10-CM

## 2022-02-22 DIAGNOSIS — N401 Enlarged prostate with lower urinary tract symptoms: Secondary | ICD-10-CM

## 2022-02-22 DIAGNOSIS — I10 Essential (primary) hypertension: Secondary | ICD-10-CM

## 2022-02-22 DIAGNOSIS — Z6835 Body mass index (BMI) 35.0-35.9, adult: Secondary | ICD-10-CM | POA: Insufficient documentation

## 2022-02-22 DIAGNOSIS — Z0000 Encounter for general adult medical examination without abnormal findings: Secondary | ICD-10-CM | POA: Diagnosis not present

## 2022-02-22 DIAGNOSIS — Z125 Encounter for screening for malignant neoplasm of prostate: Secondary | ICD-10-CM

## 2022-02-22 DIAGNOSIS — N529 Male erectile dysfunction, unspecified: Secondary | ICD-10-CM

## 2022-02-22 DIAGNOSIS — E785 Hyperlipidemia, unspecified: Secondary | ICD-10-CM

## 2022-02-22 DIAGNOSIS — Z131 Encounter for screening for diabetes mellitus: Secondary | ICD-10-CM

## 2022-02-22 DIAGNOSIS — Z96641 Presence of right artificial hip joint: Secondary | ICD-10-CM

## 2022-02-22 LAB — POCT URINALYSIS DIP (PROADVANTAGE DEVICE)
Bilirubin, UA: NEGATIVE
Blood, UA: NEGATIVE
Glucose, UA: NEGATIVE mg/dL
Ketones, POC UA: NEGATIVE mg/dL
Leukocytes, UA: NEGATIVE
Nitrite, UA: NEGATIVE
Protein Ur, POC: NEGATIVE mg/dL
Specific Gravity, Urine: 1.02
Urobilinogen, Ur: NEGATIVE
pH, UA: 5 (ref 5.0–8.0)

## 2022-02-22 NOTE — Progress Notes (Signed)
Subjective:   HPI  Wesley Lawson is a 66 y.o. male who presents for Chief Complaint  Patient presents with   fasting cpe    Fasting cpe, weight, flu shot today    Patient Care Team: Phyliss Hulick, Leward Quan as PCP - General (Family Medicine) Sees dentist Sees eye doctor Dr. Frederik Pear, orthopedics  Concerns: Semiretired.  No particular concerns other than need to lose weight.  His wife likes to eat out often  Has membership [employer], goes few times per week   Reviewed their medical, surgical, family, social, medication, and allergy history and updated chart as appropriate.  Past Medical History:  Diagnosis Date   Abnormal EKG    Allergy    Arthritis    hands, knees   Chest pain    a. normal echo stress test in 2010 with mild to moderate LVH.    Dyslipidemia    History of exercise stress test 08/2016   normal   Hyperlipidemia    Hypertension     Past Surgical History:  Procedure Laterality Date   COLONOSCOPY  07/21/2017   tubular adenoma, Dr. Wilfrid Lund   TOTAL HIP ARTHROPLASTY Right 01/21/2020   Procedure: RIGHT TOTAL HIP ARTHROPLASTY ANTERIOR APPROACH;  Surgeon: Frederik Pear, MD;  Location: [facility];  Service: Orthopedics;  Laterality: Right;   WISDOM TOOTH EXTRACTION      Family History  Problem Relation Age of Onset   Diabetes Mother    Stroke Mother    Diabetes Sister    Asthma Daughter    Colon polyps Neg Hx    Esophageal cancer Neg Hx    Rectal cancer Neg Hx    Stomach cancer Neg Hx    Cancer Neg Hx    Heart disease Neg Hx    Colon cancer Neg Hx    Liver cancer Neg Hx    Pancreatic cancer Neg Hx      Current Outpatient Medications:    acetaminophen (TYLENOL) 325 MG tablet, Take 650 mg by mouth every 6 (six) hours as needed for moderate pain. , Disp: , Rfl:    aspirin EC 81 MG tablet, Take 1 tablet (81 mg total) by mouth daily., Disp: 90 tablet, Rfl: 3   lisinopril-hydrochlorothiazide (ZESTORETIC) 20-25 MG tablet, Take 1 tablet by mouth  daily., Disp: 90 tablet, Rfl: 3   sildenafil (REVATIO) 20 MG tablet, TAKE 1 TO 5 TABLETS(20 TO 100 MG) BY MOUTH DAILY AS NEEDED, Disp: 50 tablet, Rfl: 0   valACYclovir (VALTREX) 1000 MG tablet, TAKE 2 TABLETS BY MOUTH 2 TIMES DAILY(FOR 1 DAY FOR COLD SORES OR 3 TIMES DAILY FOR 1 WEEK FOR SHINGLES, Disp: 30 tablet, Rfl: 1   pravastatin (PRAVACHOL) 20 MG tablet, Take 1 tablet (20 mg total) by mouth every evening. (Patient not taking: Reported on 02/22/2022), Disp: 90 tablet, Rfl: 3  No Known Allergies   Review of Systems Constitutional: -fever, -chills, -sweats, -unexpected weight change, -decreased appetite, -fatigue Allergy: -sneezing, -itching, -congestion Dermatology: -changing moles, --rash, -lumps ENT: -runny nose, -ear pain, -sore throat, -hoarseness, -sinus pain, -teeth pain, - ringing in ears, -hearing loss, -nosebleeds Cardiology: -chest pain, -palpitations, -swelling, -difficulty breathing when lying flat, -waking up short of breath Respiratory: -cough, -shortness of breath, -difficulty breathing with exercise or exertion, -wheezing, -coughing up blood Gastroenterology: -abdominal pain, -nausea, -vomiting, -diarrhea, -constipation, -blood in stool, -changes in bowel movement, -difficulty swallowing or eating Hematology: -bleeding, -bruising  Musculoskeletal: -joint aches, -muscle aches, -joint swelling, -back pain, -neck pain, -cramping, -  changes in gait Ophthalmology: denies vision changes, eye redness, itching, discharge Urology: -burning with urination, -difficulty urinating, -blood in urine, -urinary frequency, -urgency, -incontinence Neurology: -headache, -weakness, -tingling, -numbness, -memory loss, -falls, -dizziness Psychology: -depressed mood, -agitation, -sleep problems Male GU: no testicular mass, pain, no lymph nodes swollen, no swelling, no rash.     02/22/2022   10:40 AM 02/17/2021   12:57 PM 05/17/2019   12:25 PM 06/13/2017    8:43 AM 02/21/2014    8:58 AM   Depression screen PHQ 2/9  Decreased Interest 0 0 0 0 0  Down, Depressed, Hopeless 0 0 0 0 0  PHQ - 2 Score 0 0 0 0 0        Objective:  BP 124/82   Pulse 63   Ht 5' 11.25" (1.81 m)   Wt 254 lb 12.8 oz (115.6 kg)   BMI 35.29 kg/m   General appearance: alert, no distress, WD/WN, African American male Skin: scattered skin tags of neck and upper right chest, no other worrisome lesions HEENT: normocephalic, conjunctiva/corneas normal, sclerae anicteric, PERRLA, EOMi, nares patent, no discharge or erythema, pharynx normal Oral cavity: MMM, tongue normal, teeth in good repair Neck: supple, no lymphadenopathy, no thyromegaly, no masses, normal ROM, no bruits Chest: non tender, normal shape and expansion Heart: RRR, normal S1, S2, no murmurs Lungs: CTA bilaterally, no wheezes, rhonchi, or rales Abdomen: +bs, soft, non tender, non distended, no masses, no hepatomegaly, no splenomegaly, no bruits Back: non tender, normal ROM, no scoliosis Musculoskeletal: upper extremities non tender, no obvious deformity, normal ROM throughout, lower extremities non tender, no obvious deformity, normal ROM throughout Extremities: no edema, no cyanosis, no clubbing Pulses: 2+ symmetric, upper and lower extremities, normal cap refill Neurological: alert, oriented x 3, CN2-12 intact, strength normal upper extremities and lower extremities, sensation normal throughout, DTRs 2+ throughout, no cerebellar signs, gait normal Psychiatric: normal affect, behavior normal, pleasant  GU: normal male external genitalia,circumcised, nontender, no masses, no hernia, no lymphadenopathy Rectal: declines    Assessment and Plan :   Encounter Diagnoses  Name Primary?   Encounter for health maintenance examination in adult Yes   Needs flu shot    Benign prostatic hyperplasia with lower urinary tract symptoms, symptom details unspecified    Essential hypertension    Erectile dysfunction, unspecified erectile  dysfunction type    Hyperlipidemia, unspecified hyperlipidemia type    S/P hip replacement, right    Screening for diabetes mellitus    Screening for prostate cancer    BMI 35.0-35.9,adult     This visit was a preventative care visit, also known as wellness visit or routine physical.   Topics typically include healthy lifestyle, diet, exercise, preventative care, vaccinations, sick and well care, proper use of emergency dept and after hours care, as well as other concerns.     Recommendations: Continue to return yearly for your annual wellness and preventative care visits.  This gives [place] a chance to discuss healthy lifestyle, exercise, vaccinations, review your chart record, and perform screenings where appropriate.  I recommend you see your eye doctor yearly for routine vision care.  I recommend you see your dentist yearly for routine dental care including hygiene visits twice yearly.   Vaccination recommendations were reviewed Immunization History  Administered Date(s) Administered   Fluad Quad(high Dose 65+) 02/22/2022   Influenza,inj,Quad PF,6+ Mos 04/01/2013, 05/16/2017, 05/02/2018, 02/14/2019, 02/17/2021   Influenza,inj,quad, With Preservative 05/02/2018   Influenza-Unspecified 05/16/2017   PFIZER(Purple Top)SARS-COV-2 Vaccination 08/25/2019, 09/22/2019   Pneumococcal  Polysaccharide-23 05/17/2019   Tdap 06/13/2017    Shingles vaccine:  I recommend you have a shingles vaccine to help prevent shingles or herpes zoster outbreak.   Please call your insurer to inquire about coverage for the Shingrix vaccine given in 2 doses.   Some insurers cover this vaccine after age 42, some cover this after age 10.  If your insurer covers this, then call to schedule appointment to have this vaccine here.  Counseled on the influenza virus vaccine.  Vaccine information sheet given.  Influenza vaccine given after consent obtained.   Screening for cancer: Colon cancer screening: I reviewed your  colonoscopy on file that is up to date from 2019  We discussed PSA, prostate exam, and prostate cancer screening risks/benefits.   Sees urology  Skin cancer screening: Check your skin regularly for new changes, growing lesions, or other lesions of concern Come in for evaluation if you have skin lesions of concern.  Lung cancer screening: If you have a greater than 20 pack year history of tobacco use, then you may qualify for lung cancer screening with a chest CT scan.   Please call your insurance company to inquire about coverage for this test.  We currently don't have screenings for other cancers besides breast, cervical, colon, and lung cancers.  If you have a strong family history of cancer or have other cancer screening concerns, please let me know.    Bone health: Get at least 150 minutes of aerobic exercise weekly Get weight bearing exercise at least once weekly Bone density test:  A bone density test is an imaging test that uses a type of X-ray to measure the amount of calcium and other minerals in your bones. The test may be used to diagnose or screen you for a condition that causes weak or thin bones (osteoporosis), predict your risk for a broken bone (fracture), or determine how well your osteoporosis treatment is working. The bone density test is recommended for females 26 and older, or females or males <60 if certain risk factors such as thyroid disease, long term use of steroids such as for asthma or rheumatological issues, vitamin D deficiency, estrogen deficiency, family history of osteoporosis, self or family history of fragility fracture in first degree relative.    Heart health: Get at least 150 minutes of aerobic exercise weekly Limit alcohol It is important to maintain a healthy blood pressure and healthy cholesterol numbers  Heart disease screening: Screening for heart disease includes screening for blood pressure, fasting lipids, glucose/diabetes screening, BMI  height to weight ratio, reviewed of smoking status, physical activity, and diet.    Goals include blood pressure 120/80 or less, maintaining a healthy lipid/cholesterol profile, preventing diabetes or keeping diabetes numbers under good control, not smoking or using tobacco products, exercising most days per week or at least 150 minutes per week of exercise, and eating healthy variety of fruits and vegetables, healthy oils, and avoiding unhealthy food choices like fried food, fast food, high sugar and high cholesterol foods.    Other tests may possibly include EKG test, CT coronary calcium score, echocardiogram, exercise treadmill stress test.    Medical care options: I recommend you continue to seek care here first for routine care.  We try really hard to have available appointments Monday through Friday daytime hours for sick visits, acute visits, and physicals.  Urgent care should be used for after hours and weekends for significant issues that cannot wait till the next day.  The emergency department  should be used for significant potentially life-threatening emergencies.  The emergency department is expensive, can often have long wait times for less significant concerns, so try to utilize primary care, urgent care, or telemedicine when possible to avoid unnecessary trips to the emergency department.  Virtual visits and telemedicine have been introduced since the pandemic started in 2020, and can be convenient ways to receive medical care.  We offer virtual appointments as well to assist you in a variety of options to seek medical care.   Advanced Directives: I recommend you consider completing a [place] and Living Will.   These documents respect your wishes and help alleviate burdens on your loved ones if you were to become terminally ill or be in a position to need those documents enforced.    You can complete Advanced Directives yourself, have them notarized, then have  copies made for our office, for you and for anybody you feel should have them in safe keeping.  Or, you can have an attorney prepare these documents.   If you haven't updated your Last Will and Testament in a while, it may be worthwhile having an attorney prepare these documents together and save on some costs.       Separate significant issues discussed: Hypertension-continue lisinopril HCT 20/25 mg daily  Hyperlipidemia-he recently has not been taking pravastatin as he forgets to take it at night.  Obesity-discussed need to lose weight through healthy diet and exercise.  We discussed possible trial of Wegovy, focus on low calorie diet 1800 cal or less per day   Heriberto was seen today for fasting cpe.  Diagnoses and all orders for this visit:  Encounter for health maintenance examination in adult -     PSA -     Comprehensive metabolic panel -     CBC -     Lipid panel -     POCT Urinalysis DIP (Proadvantage Device) -     Hemoglobin A1c  Needs flu shot -     Flu Vaccine QUAD High Dose(Fluad)  Benign prostatic hyperplasia with lower urinary tract symptoms, symptom details unspecified -     PSA  Essential hypertension  Erectile dysfunction, unspecified erectile dysfunction type  Hyperlipidemia, unspecified hyperlipidemia type -     Comprehensive metabolic panel -     Lipid panel  S/P hip replacement, right  Screening for diabetes mellitus  Screening for prostate cancer -     PSA  BMI 35.0-35.9,adult   Follow up pending labs, yearly for physical

## 2022-02-23 ENCOUNTER — Other Ambulatory Visit: Payer: Self-pay | Admitting: Medical

## 2022-02-23 DIAGNOSIS — E785 Hyperlipidemia, unspecified: Secondary | ICD-10-CM

## 2022-02-23 LAB — CBC
Hematocrit: 46.6 % (ref 37.5–51.0)
Hemoglobin: 15.3 g/dL (ref 13.0–17.7)
MCH: 28.5 pg (ref 26.6–33.0)
MCHC: 32.8 g/dL (ref 31.5–35.7)
MCV: 87 fL (ref 79–97)
Platelets: 193 10*3/uL (ref 150–450)
RBC: 5.36 x10E6/uL (ref 4.14–5.80)
RDW: 13.8 % (ref 11.6–15.4)
WBC: 3.8 10*3/uL (ref 3.4–10.8)

## 2022-02-23 LAB — COMPREHENSIVE METABOLIC PANEL WITH GFR
ALT: 28 [IU]/L (ref 0–44)
AST: 20 [IU]/L (ref 0–40)
Albumin/Globulin Ratio: 1.8 (ref 1.2–2.2)
Albumin: 4.9 g/dL (ref 3.9–4.9)
Alkaline Phosphatase: 88 [IU]/L (ref 44–121)
BUN/Creatinine Ratio: 10 (ref 10–24)
BUN: 13 mg/dL (ref 8–27)
Bilirubin Total: 0.6 mg/dL (ref 0.0–1.2)
CO2: 25 mmol/L (ref 20–29)
Calcium: 9.7 mg/dL (ref 8.6–10.2)
Chloride: 101 mmol/L (ref 96–106)
Creatinine, Ser: 1.35 mg/dL — ABNORMAL HIGH (ref 0.76–1.27)
Globulin, Total: 2.7 g/dL (ref 1.5–4.5)
Glucose: 104 mg/dL — ABNORMAL HIGH (ref 70–99)
Potassium: 4.5 mmol/L (ref 3.5–5.2)
Sodium: 140 mmol/L (ref 134–144)
Total Protein: 7.6 g/dL (ref 6.0–8.5)
eGFR: 58 mL/min/{1.73_m2} — ABNORMAL LOW

## 2022-02-23 LAB — LIPID PANEL
Chol/HDL Ratio: 4.9 ratio (ref 0.0–5.0)
Cholesterol, Total: 224 mg/dL — ABNORMAL HIGH (ref 100–199)
HDL: 46 mg/dL (ref >39)
LDL Chol Calc (NIH): 149 mg/dL — ABNORMAL HIGH (ref 0–99)
Triglycerides: 161 mg/dL — ABNORMAL HIGH (ref 0–149)
VLDL Cholesterol Cal: 29 mg/dL (ref 5–40)

## 2022-02-23 LAB — PSA: Prostate Specific Ag, Serum: 1.5 ng/mL (ref 0.0–4.0)

## 2022-02-23 LAB — HEMOGLOBIN A1C
Est. average glucose Bld gHb Est-mCnc: 123 mg/dL
Hgb A1c MFr Bld: 5.9 % — ABNORMAL HIGH (ref 4.8–5.6)

## 2022-02-23 MED ORDER — VALACYCLOVIR HCL 1 G PO TABS: ORAL_TABLET | ORAL | 1 refills | Status: DC

## 2022-02-23 MED ORDER — LISINOPRIL-HYDROCHLOROTHIAZIDE 20-25 MG PO TABS: 1.0000 | ORAL_TABLET | Freq: Every day | ORAL | 3 refills | Status: DC

## 2022-02-23 MED ORDER — PRAVASTATIN SODIUM 20 MG PO TABS: 20.0000 mg | ORAL_TABLET | Freq: Every evening | ORAL | 3 refills | Status: DC

## 2022-02-23 MED ORDER — ASPIRIN 81 MG PO TBEC: 81.0000 mg | DELAYED_RELEASE_TABLET | Freq: Every day | ORAL | 3 refills | Status: DC

## 2022-02-23 MED ORDER — SILDENAFIL CITRATE 20 MG PO TABS: 20.0000 mg | ORAL_TABLET | Freq: Every day | ORAL | 3 refills | Status: DC | PRN

## 2022-03-23 ENCOUNTER — Encounter: Payer: Self-pay | Admitting: Internal Medicine

## 2022-04-20 ENCOUNTER — Telehealth: Payer: Self-pay | Admitting: Medical

## 2022-04-20 NOTE — Telephone Encounter (Signed)
Left message for patient to call back and schedule Medicare Annual Wellness Visit (AWV) either virtually or in office. Left  my Herbie Drape number [phone]  *due 02/12/22 awvi per [employer]  please schedule with Nurse Health Adviser   45 min for awv-i and in office appointments 30 min for awv-s  phone/virtual appointments

## 2022-06-11 ENCOUNTER — Ambulatory Visit (INDEPENDENT_AMBULATORY_CARE_PROVIDER_SITE_OTHER): Payer: Medicare Other

## 2022-06-11 VITALS — BP 142/78 | HR 69 | Temp 97.6°F | Ht 71.0 in | Wt 255.6 lb

## 2022-06-11 DIAGNOSIS — Z0000 Encounter for general adult medical examination without abnormal findings: Secondary | ICD-10-CM | POA: Diagnosis not present

## 2022-06-11 NOTE — Patient Instructions (Signed)
Wesley Lawson , Thank you for taking time to come for your Medicare Wellness Visit. I appreciate your ongoing commitment to your health goals. Please review the following plan we discussed and let me know if I can assist you in the future.   These are the goals we discussed:  Goals      Patient Stated     06/11/2022, wants to lose weight        This is a list of the screening recommended for you and due dates:  Health Maintenance  Topic Date Due   Zoster (Shingles) Vaccine (1 of 2) Never done   Pneumonia Vaccine (2 - PCV) 02/19/2021   COVID-19 Vaccine (3 - 2023-24 season) 02/12/2022   Colon Cancer Screening  07/21/2022   Medicare Annual Wellness Visit  06/12/2023   DTaP/Tdap/Td vaccine (2 - Td or Tdap) 06/14/2027   Flu Shot  Completed   Hepatitis C Screening: USPSTF Recommendation to screen - Ages 18-79 yo.  Completed   HPV Vaccine  Aged Out    Advanced directives: Advance directive discussed with you today. Even though you declined this today please call our office should you change your mind and we can give you the proper paperwork for you to fill out.  Conditions/risks identified: none  Next appointment: Follow up in one year for your annual wellness visit.   Preventive Care 79 Years and Older, Male  Preventive care refers to lifestyle choices and visits with your health care provider that can promote health and wellness. What does preventive care include? A yearly physical exam. This is also called an annual well check. Dental exams once or twice a year. Routine eye exams. Ask your health care provider how often you should have your eyes checked. Personal lifestyle choices, including: Daily care of your teeth and gums. Regular physical activity. Eating a healthy diet. Avoiding tobacco and drug use. Limiting alcohol use. Practicing safe sex. Taking low doses of aspirin every day. Taking vitamin and mineral supplements as recommended by your health care provider. What  happens during an annual well check? The services and screenings done by your health care provider during your annual well check will depend on your age, overall health, lifestyle risk factors, and family history of disease. Counseling  Your health care provider may ask you questions about your: Alcohol use. Tobacco use. Drug use. Emotional well-being. Home and relationship well-being. Sexual activity. Eating habits. History of falls. Memory and ability to understand (cognition). Work and work Statistician. Screening  You may have the following tests or measurements: Height, weight, and BMI. Blood pressure. Lipid and cholesterol levels. These may be checked every 5 years, or more frequently if you are over 57 years old. Skin check. Lung cancer screening. You may have this screening every year starting at age 66 if you have a 30-pack-year history of smoking and currently smoke or have quit within the past 15 years. Fecal occult blood test (FOBT) of the stool. You may have this test every year starting at age 66. Flexible sigmoidoscopy or colonoscopy. You may have a sigmoidoscopy every 5 years or a colonoscopy every 10 years starting at age 66. Prostate cancer screening. Recommendations will vary depending on your family history and other risks. Hepatitis C blood test. Hepatitis B blood test. Sexually transmitted disease (STD) testing. Diabetes screening. This is done by checking your blood sugar (glucose) after you have not eaten for a while (fasting). You may have this done every 1-3 years. Abdominal aortic aneurysm (AAA)  screening. You may need this if you are a current or former smoker. Osteoporosis. You may be screened starting at age 66 if you are at high risk. Talk with your health care provider about your test results, treatment options, and if necessary, the need for more tests. Vaccines  Your health care provider may recommend certain vaccines, such as: Influenza vaccine. This  is recommended every year. Tetanus, diphtheria, and acellular pertussis (Tdap, Td) vaccine. You may need a Td booster every 10 years. Zoster vaccine. You may need this after age 66. Pneumococcal 13-valent conjugate (PCV13) vaccine. One dose is recommended after age 66. Pneumococcal polysaccharide (PPSV23) vaccine. One dose is recommended after age 66. Talk to your health care provider about which screenings and vaccines you need and how often you need them. This information is not intended to replace advice given to you by your health care provider. Make sure you discuss any questions you have with your health care provider. Document Released: 06/27/2015 Document Revised: 02/18/2016 Document Reviewed: 04/01/2015 Elsevier Interactive Patient Education  2017 [place] Prevention in the Home Falls can cause injuries. They can happen to people of all ages. There are many things you can do to make your home safe and to help prevent falls. What can I do on the outside of my home? Regularly fix the edges of walkways and driveways and fix any cracks. Remove anything that might make you trip as you walk through a door, such as a raised step or threshold. Trim any bushes or trees on the path to your home. Use bright outdoor lighting. Clear any walking paths of anything that might make someone trip, such as rocks or tools. Regularly check to see if handrails are loose or broken. Make sure that both sides of any steps have handrails. Any raised decks and porches should have guardrails on the edges. Have any leaves, snow, or ice cleared regularly. Use sand or salt on walking paths during winter. Clean up any spills in your garage right away. This includes oil or grease spills. What can I do in the bathroom? Use night lights. Install grab bars by the toilet and in the tub and shower. Do not use towel bars as grab bars. Use non-skid mats or decals in the tub or shower. If you need to sit down  in the shower, use a plastic, non-slip stool. Keep the floor dry. Clean up any water that spills on the floor as soon as it happens. Remove soap buildup in the tub or shower regularly. Attach bath mats securely with double-sided non-slip rug tape. Do not have throw rugs and other things on the floor that can make you trip. What can I do in the bedroom? Use night lights. Make sure that you have a light by your bed that is easy to reach. Do not use any sheets or blankets that are too big for your bed. They should not hang down onto the floor. Have a firm chair that has side arms. You can use this for support while you get dressed. Do not have throw rugs and other things on the floor that can make you trip. What can I do in the kitchen? Clean up any spills right away. Avoid walking on wet floors. Keep items that you use a lot in easy-to-reach places. If you need to reach something above you, use a strong step stool that has a grab bar. Keep electrical cords out of the way. Do not use floor polish or  wax that makes floors slippery. If you must use wax, use non-skid floor wax. Do not have throw rugs and other things on the floor that can make you trip. What can I do with my stairs? Do not leave any items on the stairs. Make sure that there are handrails on both sides of the stairs and use them. Fix handrails that are broken or loose. Make sure that handrails are as long as the stairways. Check any carpeting to make sure that it is firmly attached to the stairs. Fix any carpet that is loose or worn. Avoid having throw rugs at the top or bottom of the stairs. If you do have throw rugs, attach them to the floor with carpet tape. Make sure that you have a light switch at the top of the stairs and the bottom of the stairs. If you do not have them, ask someone to add them for you. What else can I do to help prevent falls? Wear shoes that: Do not have high heels. Have rubber bottoms. Are comfortable  and fit you well. Are closed at the toe. Do not wear sandals. If you use a stepladder: Make sure that it is fully opened. Do not climb a closed stepladder. Make sure that both sides of the stepladder are locked into place. Ask someone to hold it for you, if possible. Clearly mark and make sure that you can see: Any grab bars or handrails. First and last steps. Where the edge of each step is. Use tools that help you move around (mobility aids) if they are needed. These include: Canes. Walkers. Scooters. Crutches. Turn on the lights when you go into a dark area. Replace any light bulbs as soon as they burn out. Set up your furniture so you have a clear path. Avoid moving your furniture around. If any of your floors are uneven, fix them. If there are any pets around you, be aware of where they are. Review your medicines with your doctor. Some medicines can make you feel dizzy. This can increase your chance of falling. Ask your doctor what other things that you can do to help prevent falls. This information is not intended to replace advice given to you by your health care provider. Make sure you discuss any questions you have with your health care provider. Document Released: 03/27/2009 Document Revised: 11/06/2015 Document Reviewed: 07/05/2014 Elsevier Interactive Patient Education  2017 [employer].

## 2022-06-11 NOTE — Progress Notes (Signed)
Subjective:   Wesley Lawson is a 66 y.o. male who presents for an Initial Medicare Annual Wellness Visit.  Review of Systems     Cardiac Risk Factors include: advanced age (>81mn, >>41women);dyslipidemia;hypertension;male gender;obesity (BMI >30kg/m2)     Objective:    Today's Vitals   06/11/22 1052 06/11/22 1057 06/11/22 1114  BP: (!) 142/82  (!) 142/78  Pulse: 69    Temp: 97.6 F (36.4 C)    TempSrc: Oral    SpO2: 94%    Weight: 255 lb 9.6 oz (115.9 kg)    Height: '5\' 11"'$  (1.803 m)    PainSc:  4     Body mass index is 35.65 kg/m.     06/11/2022   11:02 AM 01/21/2020    6:15 AM 01/09/2020    8:23 AM 07/30/2017    9:48 PM 07/21/2017   11:01 AM 01/13/2016    3:28 PM 06/05/2015    7:13 PM  Advanced Directives  Does Patient Have a Medical Advance Directive? No No No No No No No  Would patient like information on creating a medical advance directive? No - Patient declined No - Patient declined No - Patient declined No - Patient declined   No - patient declined information    Current Medications (verified) Outpatient Encounter Medications as of 06/11/2022  Medication Sig   acetaminophen (TYLENOL) 325 MG tablet Take 650 mg by mouth every 6 (six) hours as needed for moderate pain.    aspirin EC 81 MG tablet Take 1 tablet (81 mg total) by mouth daily.   lisinopril-hydrochlorothiazide (ZESTORETIC) 20-25 MG tablet Take 1 tablet by mouth daily.   pravastatin (PRAVACHOL) 20 MG tablet Take 1 tablet (20 mg total) by mouth every evening.   sildenafil (REVATIO) 20 MG tablet Take 1 tablet (20 mg total) by mouth daily as needed.   valACYclovir (VALTREX) 1000 MG tablet TAKE 2 TABLETS BY MOUTH 2 TIMES DAILY(FOR 1 DAY FOR COLD SORES OR 3 TIMES DAILY FOR 1 WEEK FOR SHINGLES   No facility-administered encounter medications on file as of 06/11/2022.    Allergies (verified) Patient has no known allergies.   History: Past Medical History:  Diagnosis Date   Abnormal EKG    Allergy     Arthritis    hands, knees   Chest pain    a. normal echo stress test in 2010 with mild to moderate LVH.    Dyslipidemia    History of exercise stress test 08/2016   normal   Hyperlipidemia    Hypertension    Past Surgical History:  Procedure Laterality Date   COLONOSCOPY  07/21/2017   tubular adenoma, Dr. 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   Procedure: RIGHT TOTAL HIP ARTHROPLASTY ANTERIOR APPROACH;  Surgeon: RFrederik Pear MD;  Location: [facility];  Service: Orthopedics;  Laterality: Right;   WISDOM TOOTH EXTRACTION     Family History  Problem Relation Age of Onset   Diabetes Mother    Stroke Mother    Diabetes Sister    Asthma Daughter    Colon polyps Neg Hx    Esophageal cancer Neg Hx    Rectal cancer Neg Hx    Stomach cancer Neg Hx    Cancer Neg Hx    Heart disease Neg Hx    Colon cancer Neg Hx    Liver cancer Neg Hx    Pancreatic cancer Neg Hx    Social History   Socioeconomic History   Marital  status: Married    Spouse name: Not on file   Number of children: Not on file   Years of education: Not on file   Highest education level: Not on file  Occupational History   Not on file  Tobacco Use   Smoking status: Never   Smokeless tobacco: Never  Vaping Use   Vaping Use: Never used  Substance and Sexual Activity   Alcohol use: Yes    Alcohol/week: 3.0 standard drinks of alcohol    Types: 3 Cans of beer per week    Comment: once a week   Drug use: No   Sexual activity: Yes    Birth control/protection: None  Other Topics Concern   Not on file  Social History Narrative   Married, 2 child [place] and [place], part time [place], was full time at [employer].  Retired 02/2021.      Social Determinants of Health   Financial Resource Strain: Low Risk  (06/11/2022)   Overall Financial Resource Strain (CARDIA)    Difficulty of Paying Living Expenses: Not hard at all  Food Insecurity: No Food Insecurity (06/11/2022)   Hunger Vital Sign    Worried About  Running Out of Food in the Last Year: Never true    Ran Out of Food in the Last Year: Never true  Transportation Needs: Not on file  Physical Activity: Inactive (06/11/2022)   Exercise Vital Sign    Days of Exercise per Week: 0 days    Minutes of Exercise per Session: 0 min  Stress: No Stress Concern Present (06/11/2022)   [place]    Feeling of Stress : Only a little  Social Connections: Not on file    Tobacco Counseling Counseling given: Not Answered   Clinical Intake:  Pre-visit preparation completed: Yes  Pain : 0-10 Pain Score: 4  Pain Type: Acute pain Pain Location: Head Pain Onset: Yesterday Pain Frequency: Intermittent     Nutritional Status: BMI > 30  Obese Nutritional Risks: None Diabetes: No  How often do you need to have someone help you when you read instructions, pamphlets, or other written materials from your doctor or pharmacy?: 1 - Never  Diabetic? no  Interpreter Needed?: No  Information entered by :: NAllen LPN   Activities of Daily Living    06/11/2022   11:05 AM  In your present state of health, do you have any difficulty performing the following activities:  Hearing? 0  Vision? 0  Difficulty concentrating or making decisions? 0  Walking or climbing stairs? 0  Dressing or bathing? 0  Doing errands, shopping? 0  Preparing Food and eating ? N  Using the Toilet? N  In the past six months, have you accidently leaked urine? N  Do you have problems with loss of bowel control? N  Managing your Medications? N  Managing your Finances? N  Housekeeping or managing your Housekeeping? N    Patient Care Team: Tysinger, Camelia Eng, PA-C as PCP - General (Family Medicine)  Indicate any recent Medical Services you may have received from other than [facility] providers in the past year (date may be approximate).     Assessment:   This is a routine wellness examination for  Wesley Lawson.  Hearing/Vision screen Vision Screening - Comments:: Regular eye exams, [facility]  Dietary issues and exercise activities discussed: Current Exercise Habits: The patient has a physically strenuous job, but has no regular exercise apart from work.  Goals Addressed             This Visit's Progress    Patient Stated       06/11/2022, wants to lose weight       Depression Screen    06/11/2022   11:03 AM 02/22/2022   10:40 AM 02/17/2021   12:57 PM 05/17/2019   12:25 PM 06/13/2017    8:43 AM 02/21/2014    8:58 AM  PHQ 2/9 Scores  PHQ - 2 Score 0 0 0 0 0 0  PHQ- 9 Score 0         Fall Risk    06/11/2022   11:03 AM 02/22/2022   10:40 AM 02/17/2021   12:57 PM 05/17/2019   12:24 PM 06/13/2017    8:43 AM  Fall Risk   Falls in the past year? 0 0 0 0 No  Number falls in past yr: 0 0 0    Injury with Fall? 0 0 0    Risk for fall due to : Medication side effect No Fall Risks No Fall Risks    Follow up Falls prevention discussed;Education provided;Falls evaluation completed Falls evaluation completed Falls evaluation completed      FALL RISK PREVENTION PERTAINING TO THE HOME:  Any stairs in or around the home? No  If so, are there any without handrails?  N/a Home free of loose throw rugs in walkways, pet beds, electrical cords, etc? Yes  Adequate lighting in your home to reduce risk of falls? Yes   ASSISTIVE DEVICES UTILIZED TO PREVENT FALLS:  Life alert? No  Use of a cane, walker or w/c? No  Grab bars in the bathroom? Yes  Shower chair or bench in shower? No  Elevated toilet seat or a handicapped toilet? No   TIMED UP AND GO:  Was the test performed? Yes .  Length of time to ambulate 10 feet: 5 sec.   Gait steady and fast without use of assistive device  Cognitive Function:        06/11/2022   11:07 AM  6CIT Screen  What Year? 0 points  What month? 0 points  What time? 0 points  Count back from 20 0 points  Months in reverse 2 points  Repeat  phrase 0 points  Total Score 2 points    Immunizations Immunization History  Administered Date(s) Administered   Fluad Quad(high Dose 65+) 02/22/2022   Influenza,inj,Quad PF,6+ Mos 04/01/2013, 05/16/2017, 05/02/2018, 02/14/2019, 02/17/2021   Influenza,inj,quad, With Preservative 05/02/2018   Influenza-Unspecified 05/16/2017   PFIZER(Purple Top)SARS-COV-2 Vaccination 08/25/2019, 09/22/2019   Pneumococcal Polysaccharide-23 05/17/2019   Tdap 06/13/2017    TDAP status: Up to date  Flu Vaccine status: Up to date  Pneumococcal vaccine status: Up to date  Covid-19 vaccine status: Completed vaccines  Qualifies for Shingles Vaccine? Yes   Zostavax completed No   Shingrix Completed?: No.    Education has been provided regarding the importance of this vaccine. Patient has been advised to call insurance company to determine out of pocket expense if they have not yet received this vaccine. Advised may also receive vaccine at local pharmacy or Health Dept. Verbalized acceptance and understanding.  Screening Tests Health Maintenance  Topic Date Due   Zoster Vaccines- Shingrix (1 of 2) Never done   Pneumonia Vaccine 12+ Years old (2 - PCV) 02/19/2021   COVID-19 Vaccine (3 - 2023-24 season) 02/12/2022   COLONOSCOPY (Pts 45-65yr Insurance coverage will need to be confirmed)  07/21/2022   Medicare Annual  Wellness (AWV)  06/12/2023   DTaP/Tdap/Td (2 - Td or Tdap) 06/14/2027   INFLUENZA VACCINE  Completed   Hepatitis C Screening  Completed   HPV VACCINES  Aged Out    Health Maintenance  Health Maintenance Due  Topic Date Due   Zoster Vaccines- Shingrix (1 of 2) Never done   Pneumonia Vaccine 79+ Years old (2 - PCV) 02/19/2021   COVID-19 Vaccine (3 - 2023-24 season) 02/12/2022    Colorectal cancer screening: Type of screening: Colonoscopy. Completed 07/21/2017. Repeat every 5 years  Lung Cancer Screening: (Low Dose CT Chest recommended if Age 24-80 years, 30 pack-year currently smoking  OR have quit w/in 15years.) does not qualify.   Lung Cancer Screening Referral: no  Additional Screening:  Hepatitis C Screening: does qualify; Completed 06/13/2017  Vision Screening: Recommended annual ophthalmology exams for early detection of glaucoma and other disorders of the eye. Is the patient up to date with their annual eye exam?  Yes  Who is the provider or what is the name of the office in which the patient attends annual eye exams? [facility] If pt is not established with a provider, would they like to be referred to a provider to establish care? No .   Dental Screening: Recommended annual dental exams for proper oral hygiene  Community Resource Referral / Chronic Care Management: CRR required this visit?  No   CCM required this visit?  No      Plan:     I have personally reviewed and noted the following in the patient's chart:   Medical and social history Use of alcohol, tobacco or illicit drugs  Current medications and supplements including opioid prescriptions. Patient is not currently taking opioid prescriptions. Functional ability and status Nutritional status Physical activity Advanced directives List of other physicians Hospitalizations, surgeries, and ER visits in previous 12 months Vitals Screenings to include cognitive, depression, and falls Referrals and appointments  In addition, I have reviewed and discussed with patient certain preventive protocols, quality metrics, and best practice recommendations. A written personalized care plan for preventive services as well as general preventive health recommendations were provided to patient.     Kellie Simmering, LPN   04/13/5944   Nurse Notes: none

## 2022-06-28 ENCOUNTER — Encounter: Payer: Self-pay | Admitting: Gastroenterology

## 2022-07-19 ENCOUNTER — Ambulatory Visit
Admission: EM | Admit: 2022-07-19 | Discharge: 2022-07-19 | Discharge status: Against Medical Advice | Payer: Medicare Other

## 2022-07-20 ENCOUNTER — Ambulatory Visit (INDEPENDENT_AMBULATORY_CARE_PROVIDER_SITE_OTHER): Payer: Medicare Other | Admitting: Medical

## 2022-07-20 VITALS — BP 128/86 | HR 64 | Temp 97.2°F | Wt 257.4 lb

## 2022-07-20 DIAGNOSIS — H6123 Impacted cerumen, bilateral: Secondary | ICD-10-CM

## 2022-07-20 DIAGNOSIS — H9193 Unspecified hearing loss, bilateral: Secondary | ICD-10-CM | POA: Diagnosis not present

## 2022-07-20 DIAGNOSIS — H938X3 Other specified disorders of ear, bilateral: Secondary | ICD-10-CM

## 2022-07-20 NOTE — Progress Notes (Signed)
Subjective:   Here for complaint decreased hearing, concern for possible buildup of earwax in both ear.  No URI symptoms, no other concerns.  No other aggravating or relieving factors.  No other complaint.  Review of Systems Constitutional: denies fever, chills, sweats ENT: no runny nose, ear pain, sore throat, hoarseness, sinus pain, teeth pain, tinnitus, hearing loss Gastroenterology: denies nausea, vomiting     Objective:   Physical Exam  General appearance: alert, no distress, WD/WN Ears: bilat ear canal with impacted cerumen HENT: conjunctiva/corneas normal, sclerae anicteric, nares patent, no discharge or erythema, pharynx normal Oral cavity: MMM, tongue normal, teeth normal Neurological: Hearing normal bilaterally to whisper    Assessment & Plan:    Encounter Diagnoses  Name Primary?   Decreased hearing of both ears Yes   Ear pressure, bilateral    Impacted cerumen of both ears      Discussed findings.  Discussed risk/benefits of procedure and patient agrees to procedure. Successfully used warm water lavage to remove impacted cerumen from bilat ear canal. Patient tolerated procedure well. Advised they avoid using any cotton swabs or other devices to clean the ear canals.  Use basic hygiene as discussed.  Follow up prn.

## 2022-09-17 DIAGNOSIS — R948 Abnormal results of function studies of other organs and systems: Secondary | ICD-10-CM | POA: Diagnosis not present

## 2022-09-17 DIAGNOSIS — E349 Endocrine disorder, unspecified: Secondary | ICD-10-CM | POA: Diagnosis not present

## 2022-09-27 DIAGNOSIS — E349 Endocrine disorder, unspecified: Secondary | ICD-10-CM | POA: Diagnosis not present

## 2022-10-07 DIAGNOSIS — H1032 Unspecified acute conjunctivitis, left eye: Secondary | ICD-10-CM | POA: Diagnosis not present

## 2022-10-28 DIAGNOSIS — J069 Acute upper respiratory infection, unspecified: Secondary | ICD-10-CM | POA: Diagnosis not present

## 2022-10-28 DIAGNOSIS — H1032 Unspecified acute conjunctivitis, left eye: Secondary | ICD-10-CM | POA: Diagnosis not present

## 2023-02-24 ENCOUNTER — Ambulatory Visit (INDEPENDENT_AMBULATORY_CARE_PROVIDER_SITE_OTHER): Payer: Medicare Other | Admitting: Medical

## 2023-02-24 VITALS — BP 120/72 | HR 67 | Ht 71.0 in | Wt 262.8 lb

## 2023-02-24 DIAGNOSIS — Z0000 Encounter for general adult medical examination without abnormal findings: Secondary | ICD-10-CM | POA: Diagnosis not present

## 2023-02-24 DIAGNOSIS — Z7185 Encounter for immunization safety counseling: Secondary | ICD-10-CM | POA: Diagnosis not present

## 2023-02-24 DIAGNOSIS — E785 Hyperlipidemia, unspecified: Secondary | ICD-10-CM | POA: Diagnosis not present

## 2023-02-24 DIAGNOSIS — Z131 Encounter for screening for diabetes mellitus: Secondary | ICD-10-CM | POA: Diagnosis not present

## 2023-02-24 DIAGNOSIS — N401 Enlarged prostate with lower urinary tract symptoms: Secondary | ICD-10-CM | POA: Diagnosis not present

## 2023-02-24 DIAGNOSIS — D369 Benign neoplasm, unspecified site: Secondary | ICD-10-CM

## 2023-02-24 DIAGNOSIS — R0600 Dyspnea, unspecified: Secondary | ICD-10-CM

## 2023-02-24 DIAGNOSIS — Z23 Encounter for immunization: Secondary | ICD-10-CM

## 2023-02-24 DIAGNOSIS — I10 Essential (primary) hypertension: Secondary | ICD-10-CM

## 2023-02-24 DIAGNOSIS — Z96641 Presence of right artificial hip joint: Secondary | ICD-10-CM

## 2023-02-24 DIAGNOSIS — Z125 Encounter for screening for malignant neoplasm of prostate: Secondary | ICD-10-CM

## 2023-02-24 DIAGNOSIS — Z1211 Encounter for screening for malignant neoplasm of colon: Secondary | ICD-10-CM

## 2023-02-24 DIAGNOSIS — N529 Male erectile dysfunction, unspecified: Secondary | ICD-10-CM

## 2023-02-24 LAB — POCT URINALYSIS DIP (PROADVANTAGE DEVICE)
Blood, UA: NEGATIVE
Glucose, UA: NEGATIVE mg/dL
Leukocytes, UA: NEGATIVE
Nitrite, UA: NEGATIVE
Specific Gravity, Urine: 1.03
Urobilinogen, Ur: NEGATIVE
pH, UA: 6 (ref 5.0–8.0)

## 2023-02-24 NOTE — Addendum Note (Signed)
Addended by: Herminio Commons A on: 02/24/2023 12:03 PM   Modules accepted: Orders

## 2023-02-24 NOTE — Progress Notes (Signed)
Subjective:   HPI  Wesley Lawson is a 67 y.o. male who presents for Chief Complaint  Patient presents with   Annual Exam    Fasting cpe, flu and covid shot    Patient Care Team: Kathryn Cosby, Cleda Mccreedy as PCP - General (Family Medicine) Sees dentist Sees eye doctor Dr. Gean Birchwood, orthopedics Dr. Amada Jupiter, GI Dr. Jerilee Field, urology Dr. Anne Fu, endo  Concerns: Semiretired.  Still works quite a few hours at [employer].  Has membership [employer], goes few times per week  Hypertension-compliant with lisinopril 20/25 mg daily  Hyperlipidemia-compliant with Pravachol 20 mg daily  ED-sees urology, on sildenafil  He notes that in recent months gets a little winded sometimes with more strenuous activity but no chest pain, edema or wheezing  Reviewed their medical, surgical, family, social, medication, and allergy history and updated chart as appropriate.  Past Medical History:  Diagnosis Date   Abnormal EKG    Allergy    Arthritis    hands, knees   Chest pain    a. normal echo stress test in 2010 with mild to moderate LVH.    Dyslipidemia    History of exercise stress test 08/2016   normal   Hyperlipidemia    Hypertension     Past Surgical History:  Procedure Laterality Date   COLONOSCOPY  07/21/2017   tubular adenoma, Dr. Amada Jupiter   TOTAL HIP ARTHROPLASTY Right 01/21/2020   Procedure: RIGHT TOTAL HIP ARTHROPLASTY ANTERIOR APPROACH;  Surgeon: Gean Birchwood, MD;  Location: [facility];  Service: Orthopedics;  Laterality: Right;   WISDOM TOOTH EXTRACTION      Family History  Problem Relation Age of Onset   Diabetes Mother    Stroke Mother    Diabetes Sister    Asthma Daughter    Colon polyps Neg Hx    Esophageal cancer Neg Hx    Rectal cancer Neg Hx    Stomach cancer Neg Hx    Cancer Neg Hx    Heart disease Neg Hx    Colon cancer Neg Hx    Liver cancer Neg Hx    Pancreatic cancer Neg Hx      Current Outpatient Medications:    aspirin  EC 81 MG tablet, Take 1 tablet (81 mg total) by mouth daily., Disp: 90 tablet, Rfl: 3   lisinopril-hydrochlorothiazide (ZESTORETIC) 20-25 MG tablet, Take 1 tablet by mouth daily., Disp: 90 tablet, Rfl: 3   pravastatin (PRAVACHOL) 20 MG tablet, Take 1 tablet (20 mg total) by mouth every evening., Disp: 90 tablet, Rfl: 3   sildenafil (REVATIO) 20 MG tablet, Take 1 tablet (20 mg total) by mouth daily as needed., Disp: 50 tablet, Rfl: 3   acetaminophen (TYLENOL) 325 MG tablet, Take 650 mg by mouth every 6 (six) hours as needed for moderate pain. , Disp: , Rfl:   No Known Allergies  Review of Systems  Constitutional:  Negative for chills, fever, malaise/fatigue and weight loss.  HENT:  Negative for congestion, ear pain, hearing loss, sore throat and tinnitus.   Eyes:  Negative for blurred vision, pain and redness.  Respiratory:  Positive for shortness of breath. Negative for cough and hemoptysis.   Cardiovascular:  Negative for chest pain, palpitations, orthopnea, claudication and leg swelling.  Gastrointestinal:  Negative for abdominal pain, blood in stool, constipation, diarrhea, nausea and vomiting.  Genitourinary:  Negative for dysuria, flank pain, frequency, hematuria and urgency.  Musculoskeletal:  Negative for falls, joint pain and myalgias.  Skin:  Negative for itching and rash.  Neurological:  Negative for dizziness, tingling, speech change, weakness and headaches.  Endo/Heme/Allergies:  Negative for polydipsia. Does not bruise/bleed easily.  Psychiatric/Behavioral:  Negative for depression and memory loss. The patient is not nervous/anxious and does not have insomnia.         02/24/2023    9:40 AM 06/11/2022   11:03 AM 02/22/2022   10:40 AM 02/17/2021   12:57 PM 05/17/2019   12:25 PM  Depression screen PHQ 2/9  Decreased Interest 0 0 0 0 0  Down, Depressed, Hopeless 0 0 0 0 0  PHQ - 2 Score 0 0 0 0 0  Altered sleeping  0     Tired, decreased energy  0     Change in appetite  0      Feeling bad or failure about yourself   0     Trouble concentrating  0     Moving slowly or fidgety/restless  0     Suicidal thoughts  0     PHQ-9 Score  0     Difficult doing work/chores  Not difficult at all           Objective:  BP 120/72   Pulse 67   Ht 5\' 11"  (1.803 m)   Wt 262 lb 12.8 oz (119.2 kg)   BMI 36.65 kg/m   General appearance: alert, no distress, WD/WN, African American male Skin: scattered skin tags of neck and upper right chest, no other worrisome lesions HEENT: normocephalic, conjunctiva/corneas normal, sclerae anicteric, PERRLA, EOMi, nares patent, no discharge or erythema, pharynx normal Oral cavity: MMM, tongue normal, left upper molar with decay, otherwise teeth in good repair Neck: supple, no lymphadenopathy, no thyromegaly, no masses, normal ROM, no bruits Chest: non tender, normal shape and expansion Heart: RRR, normal S1, S2, no murmurs Lungs: CTA bilaterally, no wheezes, rhonchi, or rales Abdomen: +bs, soft, non tender, non distended, no masses, no hepatomegaly, no splenomegaly, no bruits Back: non tender, normal ROM, no scoliosis Musculoskeletal: upper extremities non tender, no obvious deformity, normal ROM throughout, lower extremities non tender, no obvious deformity, normal ROM throughout Extremities: no edema, no cyanosis, no clubbing Pulses: 2+ symmetric, upper and lower extremities, normal cap refill Neurological: alert, oriented x 3, CN2-12 intact, strength normal upper extremities and lower extremities, sensation normal throughout, DTRs 2+ throughout, no cerebellar signs, gait normal Psychiatric: normal affect, behavior normal, pleasant  GU: normal male external genitalia,circumcised, nontender, no masses, no hernia, no lymphadenopathy Rectal: declines   EKG reviewed, no acute changes compared to 2021 EKG.   T wave inversions noted on prior and current EKG    Assessment and Plan :   Encounter Diagnoses  Name Primary?   Encounter for  health maintenance examination in adult Yes   Need for influenza vaccination    Vaccine counseling    Benign prostatic hyperplasia with lower urinary tract symptoms, symptom details unspecified    Tubular adenoma    Screening for prostate cancer    Screening for diabetes mellitus    Screen for colon cancer    S/P hip replacement, right    Needs flu shot    Hyperlipidemia, unspecified hyperlipidemia type    Essential hypertension    Erectile dysfunction, unspecified erectile dysfunction type    Dyspnea, unspecified type     This visit was a preventative care visit, also known as wellness visit or routine physical.   Topics typically include healthy lifestyle, diet, exercise, preventative care,  vaccinations, sick and well care, proper use of emergency dept and after hours care, as well as other concerns.     Recommendations: Continue to return yearly for your annual wellness and preventative care visits.  This gives [place] a chance to discuss healthy lifestyle, exercise, vaccinations, review your chart record, and perform screenings where appropriate.  I recommend you see your eye doctor yearly for routine vision care.  I recommend you see your dentist yearly for routine dental care including hygiene visits twice yearly.   Vaccination recommendations were reviewed Immunization History  Administered Date(s) Administered   Fluad Quad(high Dose 65+) 02/22/2022   Influenza,inj,Quad PF,6+ Mos 04/01/2013, 05/16/2017, 05/02/2018, 02/14/2019, 02/17/2021   Influenza,inj,quad, With Preservative 05/02/2018   Influenza-Unspecified 05/16/2017   PFIZER(Purple Top)SARS-COV-2 Vaccination 08/25/2019, 09/22/2019   Pneumococcal Polysaccharide-23 05/17/2019   Tdap 06/13/2017    Vaccines recommended: Shingrix Yearly influenza Covid Prevnar 20  Vaccines given today: Counseled on the influenza virus vaccine.  Vaccine information sheet given.  Influenza vaccine given after consent obtained.  Return  at your convenience for one of the other vaccines above    Screening for cancer: Colon cancer screening: I reviewed your colonoscopy on file that is up to date from 2019.  Referral placed back for updated colonoscopy  We discussed PSA, prostate exam, and prostate cancer screening risks/benefits.   Sees urology  Skin cancer screening: Check your skin regularly for new changes, growing lesions, or other lesions of concern Come in for evaluation if you have skin lesions of concern.  Lung cancer screening: If you have a greater than 20 pack year history of tobacco use, then you may qualify for lung cancer screening with a chest CT scan.   Please call your insurance company to inquire about coverage for this test.  We currently don't have screenings for other cancers besides breast, cervical, colon, and lung cancers.  If you have a strong family history of cancer or have other cancer screening concerns, please let me know.    Bone health: Get at least 150 minutes of aerobic exercise weekly Get weight bearing exercise at least once weekly Bone density test:  A bone density test is an imaging test that uses a type of X-ray to measure the amount of calcium and other minerals in your bones. The test may be used to diagnose or screen you for a condition that causes weak or thin bones (osteoporosis), predict your risk for a broken bone (fracture), or determine how well your osteoporosis treatment is working. The bone density test is recommended for females 65 and older, or females or males <65 if certain risk factors such as thyroid disease, long term use of steroids such as for asthma or rheumatological issues, vitamin D deficiency, estrogen deficiency, family history of osteoporosis, self or family history of fragility fracture in first degree relative.    Heart health: Get at least 150 minutes of aerobic exercise weekly Limit alcohol It is important to maintain a healthy blood pressure and  healthy cholesterol numbers  Heart disease screening: Screening for heart disease includes screening for blood pressure, fasting lipids, glucose/diabetes screening, BMI height to weight ratio, reviewed of smoking status, physical activity, and diet.    Goals include blood pressure 120/80 or less, maintaining a healthy lipid/cholesterol profile, preventing diabetes or keeping diabetes numbers under good control, not smoking or using tobacco products, exercising most days per week or at least 150 minutes per week of exercise, and eating healthy variety of fruits and vegetables, healthy oils,  and avoiding unhealthy food choices like fried food, fast food, high sugar and high cholesterol foods.    Other tests may possibly include EKG test, CT coronary calcium score, echocardiogram, exercise treadmill stress test.   Consider CT coronary test   Medical care options: I recommend you continue to seek care here first for routine care.  We try really hard to have available appointments Monday through Friday daytime hours for sick visits, acute visits, and physicals.  Urgent care should be used for after hours and weekends for significant issues that cannot wait till the next day.  The emergency department should be used for significant potentially life-threatening emergencies.  The emergency department is expensive, can often have long wait times for less significant concerns, so try to utilize primary care, urgent care, or telemedicine when possible to avoid unnecessary trips to the emergency department.  Virtual visits and telemedicine have been introduced since the pandemic started in 2020, and can be convenient ways to receive medical care.  We offer virtual appointments as well to assist you in a variety of options to seek medical care.   Advanced Directives: I recommend you consider completing a Health Care Power of Attorney and Living Will.   These documents respect your wishes and help alleviate  burdens on your loved ones if you were to become terminally ill or be in a position to need those documents enforced.    You can complete Advanced Directives yourself, have them notarized, then have copies made for our office, for you and for anybody you feel should have them in safe keeping.  Or, you can have an attorney prepare these documents.   If you haven't updated your Last Will and Testament in a while, it may be worthwhile having an attorney prepare these documents together and save on some costs.       Separate significant issues discussed: Shortness of breath-EKG reviewed, no acute changes.  Labs today.  Work on efforts to lose weight   labs today.   Hypertension-continue lisinopril HCT 20/25 mg daily  Hyperlipidemia-labs today, continue pravastatin and aspirin.  Obesity-recommend you work on hydration with weight healthy diet and exercise.  There are also medications that are newer that worked really well to help with weight loss.  Erectile dysfunction-managed by urology  Hx/o BPH - labs today, no particular c/o    Annette was seen today for annual exam.  Diagnoses and all orders for this visit:  Encounter for health maintenance examination in adult -     EKG 12-Lead -     Comprehensive metabolic panel -     CBC -     Lipid panel -     Hemoglobin A1c -     PSA -     TSH -     POCT Urinalysis DIP (Proadvantage Device)  Need for influenza vaccination  Vaccine counseling  Benign prostatic hyperplasia with lower urinary tract symptoms, symptom details unspecified -     PSA  Tubular adenoma -     Ambulatory referral to Gastroenterology  Screening for prostate cancer -     PSA  Screening for diabetes mellitus -     Hemoglobin A1c  Screen for colon cancer -     Ambulatory referral to Gastroenterology  S/P hip replacement, right  Needs flu shot  Hyperlipidemia, unspecified hyperlipidemia type -     Lipid panel  Essential hypertension -     EKG  12-Lead  Erectile dysfunction, unspecified erectile dysfunction type  Dyspnea, unspecified type -     EKG 12-Lead   Follow up pending labs, yearly for physical

## 2023-02-25 ENCOUNTER — Other Ambulatory Visit: Payer: Self-pay | Admitting: Medical

## 2023-02-25 LAB — LIPID PANEL
Chol/HDL Ratio: 5.4 ratio — ABNORMAL HIGH (ref 0.0–5.0)
Cholesterol, Total: 220 mg/dL — ABNORMAL HIGH (ref 100–199)
HDL: 41 mg/dL (ref >39)
LDL Chol Calc (NIH): 148 mg/dL — ABNORMAL HIGH (ref 0–99)
Triglycerides: 170 mg/dL — ABNORMAL HIGH (ref 0–149)
VLDL Cholesterol Cal: 31 mg/dL (ref 5–40)

## 2023-02-25 LAB — CBC
Hematocrit: 45.1 % (ref 37.5–51.0)
Hemoglobin: 14.7 g/dL (ref 13.0–17.7)
MCH: 29.6 pg (ref 26.6–33.0)
MCHC: 32.6 g/dL (ref 31.5–35.7)
MCV: 91 fL (ref 79–97)
Platelets: 189 10*3/uL (ref 150–450)
RBC: 4.96 x10E6/uL (ref 4.14–5.80)
RDW: 13.7 % (ref 11.6–15.4)
WBC: 4.3 10*3/uL (ref 3.4–10.8)

## 2023-02-25 LAB — COMPREHENSIVE METABOLIC PANEL
ALT: 42 IU/L (ref 0–44)
AST: 34 IU/L (ref 0–40)
Albumin: 4.5 g/dL (ref 3.9–4.9)
Alkaline Phosphatase: 107 IU/L (ref 44–121)
BUN/Creatinine Ratio: 10 (ref 10–24)
BUN: 13 mg/dL (ref 8–27)
Bilirubin Total: 0.2 mg/dL (ref 0.0–1.2)
CO2: 23 mmol/L (ref 20–29)
Calcium: 9.7 mg/dL (ref 8.6–10.2)
Chloride: 102 mmol/L (ref 96–106)
Creatinine, Ser: 1.27 mg/dL (ref 0.76–1.27)
Globulin, Total: 2.6 g/dL (ref 1.5–4.5)
Glucose: 128 mg/dL — ABNORMAL HIGH (ref 70–99)
Potassium: 4.1 mmol/L (ref 3.5–5.2)
Sodium: 141 mmol/L (ref 134–144)
Total Protein: 7.1 g/dL (ref 6.0–8.5)
eGFR: 62 mL/min/{1.73_m2} (ref >59)

## 2023-02-25 LAB — HEMOGLOBIN A1C
Est. average glucose Bld gHb Est-mCnc: 131 mg/dL
Hgb A1c MFr Bld: 6.2 % — ABNORMAL HIGH (ref 4.8–5.6)

## 2023-02-25 LAB — TSH: TSH: 2.68 u[IU]/mL (ref 0.450–4.500)

## 2023-02-25 LAB — PSA: Prostate Specific Ag, Serum: 1.2 ng/mL (ref 0.0–4.0)

## 2023-02-25 MED ORDER — LISINOPRIL-HYDROCHLOROTHIAZIDE 20-25 MG PO TABS: 1.0000 | ORAL_TABLET | Freq: Every day | ORAL | 3 refills | Status: DC

## 2023-02-25 MED ORDER — ROSUVASTATIN CALCIUM 20 MG PO TABS
20.0000 mg | ORAL_TABLET | Freq: Every day | ORAL | 3 refills | Status: DC
Start: 2023-02-25 — End: 2023-06-29

## 2023-02-25 NOTE — Progress Notes (Signed)
Labs show cholesterol too high, you may be out of your cholesterol medicine though.  I would like to change to Crestor either way.  Lets do this instead of Pravachol.  You are at the verge of diabetes unfortunately.  Rest of labs are okay.  I would strongly recommend we try a medicine such as Wegovy or Zepbound.  These are a weekly injectable medicine with a very tiny needle.  They help control blood sugar as they are medicines that we use in diabetes but they can help with weight loss and appetite control as well.  If agreeable I will send this to the pharmacy.  I would want to recheck in about 6 weeks if you begin 1 of these medications.  If you are agreeable I would send 2 doses, the starter dose and then the second month dose.  You would do the first dose weekly for 1 month then go up to the next dose  Let me know if agreeable

## 2023-03-10 ENCOUNTER — Other Ambulatory Visit (INDEPENDENT_AMBULATORY_CARE_PROVIDER_SITE_OTHER): Payer: Medicare Other

## 2023-03-10 DIAGNOSIS — Z23 Encounter for immunization: Secondary | ICD-10-CM | POA: Diagnosis not present

## 2023-03-25 ENCOUNTER — Other Ambulatory Visit: Payer: Self-pay | Admitting: Medical

## 2023-05-16 ENCOUNTER — Ambulatory Visit (INDEPENDENT_AMBULATORY_CARE_PROVIDER_SITE_OTHER): Payer: Medicare Other | Admitting: Medical

## 2023-05-16 ENCOUNTER — Other Ambulatory Visit: Payer: BC Managed Care – PPO

## 2023-05-16 VITALS — BP 122/80 | HR 62 | Temp 97.5°F | Wt 252.0 lb

## 2023-05-16 DIAGNOSIS — Z87438 Personal history of other diseases of male genital organs: Secondary | ICD-10-CM

## 2023-05-16 DIAGNOSIS — Z23 Encounter for immunization: Secondary | ICD-10-CM

## 2023-05-16 DIAGNOSIS — R310 Gross hematuria: Secondary | ICD-10-CM | POA: Diagnosis not present

## 2023-05-16 DIAGNOSIS — Z7185 Encounter for immunization safety counseling: Secondary | ICD-10-CM | POA: Diagnosis not present

## 2023-05-16 DIAGNOSIS — R350 Frequency of micturition: Secondary | ICD-10-CM

## 2023-05-16 DIAGNOSIS — J988 Other specified respiratory disorders: Secondary | ICD-10-CM | POA: Diagnosis not present

## 2023-05-16 DIAGNOSIS — R81 Glycosuria: Secondary | ICD-10-CM | POA: Diagnosis not present

## 2023-05-16 DIAGNOSIS — J3489 Other specified disorders of nose and nasal sinuses: Secondary | ICD-10-CM

## 2023-05-16 LAB — POCT URINALYSIS DIP (PROADVANTAGE DEVICE)
Bilirubin, UA: NEGATIVE
Blood, UA: NEGATIVE
Glucose, UA: 100 mg/dL — AB
Ketones, POC UA: NEGATIVE mg/dL
Nitrite, UA: NEGATIVE
Protein Ur, POC: NEGATIVE mg/dL
Specific Gravity, Urine: 1.025
Urobilinogen, Ur: NEGATIVE
pH, UA: 6 (ref 5.0–8.0)

## 2023-05-16 LAB — POCT GLUCOSE FINGERSTICK: Glucose: 108 — AB (ref 70–99)

## 2023-05-16 MED ORDER — DOXYCYCLINE HYCLATE 100 MG PO TABS: 100.0000 mg | ORAL_TABLET | Freq: Two times a day (BID) | ORAL | 0 refills | Status: DC

## 2023-05-16 NOTE — Patient Instructions (Signed)
Urinary frequency, history of prostatitis  You are being treated for likely prostate infection.  urine culture sent Antibiotic prescribed today is doxycycline.  Make sure you complete the antibiotic. Hydrate well with water to help flush the infection from the body.  Drink at least 64 oz of water daily. You can use either cranberry juice or cranberry tablets the next few days to alter the pH of the urine and help clear the infection We will call with the results of the urine culture.  It usually takes 2-3 days to get the results.  Respiratory tract infection, sinus pressure-begin doxycycline antibiotic, can continue over-the-counter remedy for a few more days, hydrate well.  Counseled on the Shingrix vaccine.  Shingrix #2 vaccine given after consent obtained.

## 2023-05-16 NOTE — Progress Notes (Signed)
Subjective:   Wesley Lawson is a 67 y.o. male who complains of possible urinary tract infection.   Chief Complaint  Patient presents with   Urinary Tract Infection    UTI- saw blood in urine the other day. Having lower back pain.    Here for possible UTI.  Few days ago had burning with urination and afterward wiping saw some spots of blood.  Has also had some low back discomfort.   No belly pain.  No testicle pain or swelling but are sensitive.  Does have occasional discomfort in perineal area.  Has felt some chills, but no specific fever.  Hot some at night.  Had about 3 days of burning with urination.  Started drinking more water so those symptoms are some improved.   Having some urinary frequency.   No change in semen.   Has had prostate infection in the past.    No diarrhea, no bowel change.    Has had 2 weeks of runny nose, head cold, head pressure, using some benadryl, sudafed.  Has had some sore throat.    No other aggravating or relieving factors.  No other c/o.  Past Medical History:  Diagnosis Date   Abnormal EKG    Allergy    Arthritis    hands, knees   Chest pain    a. normal echo stress test in 2010 with mild to moderate LVH.    Dyslipidemia    History of exercise stress test 08/2016   normal   Hyperlipidemia    Hypertension     Current Outpatient Medications on File Prior to Visit  Medication Sig Dispense Refill   acetaminophen (TYLENOL) 325 MG tablet Take 650 mg by mouth every 6 (six) hours as needed for moderate pain.      aspirin EC 81 MG tablet Take 1 tablet (81 mg total) by mouth daily. 90 tablet 3   lisinopril-hydrochlorothiazide (ZESTORETIC) 20-25 MG tablet Take 1 tablet by mouth daily. 90 tablet 3   rosuvastatin (CRESTOR) 20 MG tablet Take 1 tablet (20 mg total) by mouth daily. 90 tablet 3   sildenafil (REVATIO) 20 MG tablet Take 1 tablet (20 mg total) by mouth daily as needed. 50 tablet 3   valACYclovir (VALTREX) 1000 MG tablet TAKE 2 TABLETS BY MOUTH  TWICE DAILY(FOR 1 DAY FOR COLD SORES OR THREE TIMES DAILY FOR 1 WEEK FOR SHINGLES) 30 tablet 1   No current facility-administered medications on file prior to visit.    ROS as in subjective  Reviewed allergies, medications, past medical, surgical, and social history.     Objective: BP 122/80   Pulse 62   Temp (!) 97.5 F (36.4 C)   Wt 252 lb (114.3 kg)   BMI 35.15 kg/m   Wt Readings from Last 3 Encounters:  05/16/23 252 lb (114.3 kg)  02/24/23 262 lb 12.8 oz (119.2 kg)  07/20/22 257 lb 6.4 oz (116.8 kg)   General appearance: alert, no distress, WD/WN HEENT: TMs pearly, nares with some erythema, no discharge, pharynx normal. Lungs clear, no wheezing rhonchi or rales Abdomen: +bs, soft, non tender, non distended, no masses, no hepatomegaly, no splenomegaly, no bruits Back: no CVA tenderness, range of motion full without obvious pain MSK: Legs nontender with normal range of motion GU/rectal: deferred/declined Negative straight leg raise   Assessment: Encounter Diagnoses  Name Primary?   Urinary frequency Yes   Gross hematuria    Vaccine counseling    History of prostatitis    Glucosuria  Sinus pressure    Respiratory tract infection      Plan: Urinary frequency, history of prostatitis  You are being treated for likely prostate infection.  urine culture sent Antibiotic prescribed today is doxycycline.  Make sure you complete the antibiotic. Hydrate well with water to help flush the infection from the body.  Drink at least 64 oz of water daily. You can use either cranberry juice or cranberry tablets the next few days to alter the pH of the urine and help clear the infection We will call with the results of the urine culture.  It usually takes 2-3 days to get the results.  Respiratory tract infection, sinus pressure-begin doxycycline antibiotic, can continue over-the-counter remedy for a few more days, hydrate well.  Counseled on the Shingrix vaccine.  Shingrix  #2 vaccine given after consent obtained.    Glucosuria, impaired glucose-continue efforts at healthy diet and exercise.  I congratulated him on his recent weight loss and diet changes  Wesley Lawson was seen today for urinary tract infection.  Diagnoses and all orders for this visit:  Urinary frequency -     Urine Culture  Gross hematuria -     POCT Urinalysis DIP (Proadvantage Device) -     Urine Culture  Vaccine counseling -     Zoster Recombinant (Shingrix )  History of prostatitis  Glucosuria -     POCT Glucose Fingerstick  Sinus pressure  Respiratory tract infection  Other orders -     doxycycline (VIBRA-TABS) 100 MG tablet; Take 1 tablet (100 mg total) by mouth 2 (two) times daily.    Return pending culture.

## 2023-05-17 LAB — URINE CULTURE

## 2023-05-18 NOTE — Progress Notes (Signed)
Results sent through MyChart

## 2023-05-24 ENCOUNTER — Ambulatory Visit: Payer: Medicare Other

## 2023-05-24 DIAGNOSIS — Z0000 Encounter for general adult medical examination without abnormal findings: Secondary | ICD-10-CM | POA: Diagnosis not present

## 2023-05-24 NOTE — Progress Notes (Signed)
Subjective:   Wesley Lawson is a 67 y.o. male who presents for Medicare Annual/Subsequent preventive examination.  Visit Complete: Virtual I connected with  Wesley Lawson on 05/24/23 by a audio enabled telemedicine application and verified that I am speaking with the correct person using two identifiers.  Patient Location: Home  Provider Location: Office/Clinic  I discussed the limitations of evaluation and management by telemedicine. The patient expressed understanding and agreed to proceed.  Vital Signs: Because this visit was a virtual/telehealth visit, some criteria may be missing or patient reported. Any vitals not documented were not able to be obtained and vitals that have been documented are patient reported.   Cardiac Risk Factors include: advanced age (>42men, >69 women);dyslipidemia;hypertension;male gender     Objective:    Today's Vitals   There is no height or weight on file to calculate BMI.     05/24/2023    4:01 PM 06/11/2022   11:02 AM 01/21/2020    6:15 AM 01/09/2020    8:23 AM 07/30/2017    9:48 PM 07/21/2017   11:01 AM 01/13/2016    3:28 PM  Advanced Directives  Does Patient Have a Medical Advance Directive? No No No No No No No  Would patient like information on creating a medical advance directive? No - Patient declined No - Patient declined No - Patient declined No - Patient declined No - Patient declined      Current Medications (verified) Outpatient Encounter Medications as of 05/24/2023  Medication Sig   acetaminophen (TYLENOL) 325 MG tablet Take 650 mg by mouth every 6 (six) hours as needed for moderate pain.    aspirin EC 81 MG tablet Take 1 tablet (81 mg total) by mouth daily.   doxycycline (VIBRA-TABS) 100 MG tablet Take 1 tablet (100 mg total) by mouth 2 (two) times daily.   lisinopril-hydrochlorothiazide (ZESTORETIC) 20-25 MG tablet Take 1 tablet by mouth daily.   sildenafil (REVATIO) 20 MG tablet Take 1 tablet (20 mg total) by mouth daily as  needed.   valACYclovir (VALTREX) 1000 MG tablet TAKE 2 TABLETS BY MOUTH TWICE DAILY(FOR 1 DAY FOR COLD SORES OR THREE TIMES DAILY FOR 1 WEEK FOR SHINGLES)   rosuvastatin (CRESTOR) 20 MG tablet Take 1 tablet (20 mg total) by mouth daily. (Patient not taking: Reported on 05/24/2023)   No facility-administered encounter medications on file as of 05/24/2023.    Allergies (verified) Patient has no known allergies.   History: Past Medical History:  Diagnosis Date   Abnormal EKG    Allergy    Arthritis    hands, knees   Chest pain    a. normal echo stress test in 2010 with mild to moderate LVH.    Dyslipidemia    History of exercise stress test 08/2016   normal   Hyperlipidemia    Hypertension    Past Surgical History:  Procedure Laterality Date   COLONOSCOPY  07/21/2017   tubular adenoma, Dr. Amada Jupiter   TOTAL HIP ARTHROPLASTY Right 01/21/2020   Procedure: RIGHT TOTAL HIP ARTHROPLASTY ANTERIOR APPROACH;  Surgeon: Gean Birchwood, MD;  Location: [facility];  Service: Orthopedics;  Laterality: Right;   WISDOM TOOTH EXTRACTION     Family History  Problem Relation Age of Onset   Diabetes Mother    Stroke Mother    Diabetes Sister    Asthma Daughter    Colon polyps Neg Hx    Esophageal cancer Neg Hx    Rectal cancer Neg Hx  Stomach cancer Neg Hx    Cancer Neg Hx    Heart disease Neg Hx    Colon cancer Neg Hx    Liver cancer Neg Hx    Pancreatic cancer Neg Hx    Social History   Socioeconomic History   Marital status: Married    Spouse name: Not on file   Number of children: Not on file   Years of education: Not on file   Highest education level: Not on file  Occupational History   Not on file  Tobacco Use   Smoking status: Never   Smokeless tobacco: Never  Vaping Use   Vaping status: Never Used  Substance and Sexual Activity   Alcohol use: Yes    Alcohol/week: 3.0 standard drinks of alcohol    Types: 3 Cans of beer per week    Comment: once a week   Drug use: No    Sexual activity: Yes    Birth control/protection: None  Other Topics Concern   Not on file  Social History Narrative   Married, 2 child April and [place], part time Abanda, was full time at [employer].  Retired 02/2021.      Social Determinants of Health   Financial Resource Strain: Low Risk  (05/24/2023)   Overall Financial Resource Strain (CARDIA)    Difficulty of Paying Living Expenses: Not hard at all  Food Insecurity: No Food Insecurity (05/24/2023)   Hunger Vital Sign    Worried About Running Out of Food in the Last Year: Never true    Ran Out of Food in the Last Year: Never true  Transportation Needs: No Transportation Needs (05/24/2023)   PRAPARE - Administrator, Civil Service (Medical): No    Lack of Transportation (Non-Medical): No  Physical Activity: Insufficiently Active (05/24/2023)   Exercise Vital Sign    Days of Exercise per Week: 2 days    Minutes of Exercise per Session: 30 min  Stress: No Stress Concern Present (05/24/2023)   [employer] of Occupational Health - Occupational Stress Questionnaire    Feeling of Stress : Not at all  Social Connections: Moderately Isolated (05/24/2023)   Social Connection and Isolation Panel [NHANES]    Frequency of Communication with Friends and Family: More than three times a week    Frequency of Social Gatherings with Friends and Family: Not on file    Attends Religious Services: Never    Database administrator or Organizations: No    Attends Engineer, structural: Never    Marital Status: Married    Tobacco Counseling Counseling given: Not Answered   Clinical Intake:  Pre-visit preparation completed: Yes  Pain : No/denies pain     Nutritional Risks: None Diabetes: No  How often do you need to have someone help you when you read instructions, pamphlets, or other written materials from your doctor or pharmacy?: 1 - Never  Interpreter Needed?: No  Information entered by :: NAllen  LPN   Activities of Daily Living    05/24/2023    3:57 PM 06/11/2022   11:05 AM  In your present state of health, do you have any difficulty performing the following activities:  Hearing? 0 0  Vision? 0 0  Difficulty concentrating or making decisions? 0 0  Walking or climbing stairs? 0 0  Dressing or bathing? 0 0  Doing errands, shopping? 0 0  Preparing Food and eating ? N N  Using the Toilet? N N  In the  past six months, have you accidently leaked urine? N N  Do you have problems with loss of bowel control? N N  Managing your Medications? N N  Managing your Finances? N N  Housekeeping or managing your Housekeeping? N N    Patient Care Team: Tysinger, Kermit Balo, PA-C as PCP - General (Family Medicine)  Indicate any recent Medical Services you may have received from other than [facility] providers in the past year (date may be approximate).     Assessment:   This is a routine wellness examination for Jaser.  Hearing/Vision screen Hearing Screening - Comments:: Denies hearing issues Vision Screening - Comments:: Regular eye exams, [facility]   Goals Addressed             This Visit's Progress    Patient Stated       05/24/2023, wants to lose weight       Depression Screen    05/24/2023    4:02 PM 02/24/2023    9:40 AM 06/11/2022   11:03 AM 02/22/2022   10:40 AM 02/17/2021   12:57 PM 05/17/2019   12:25 PM 06/13/2017    8:43 AM  PHQ 2/9 Scores  PHQ - 2 Score 0 0 0 0 0 0 0  PHQ- 9 Score 1  0        Fall Risk    05/24/2023    4:01 PM 02/24/2023    9:39 AM 07/20/2022    8:24 AM 06/11/2022   11:03 AM 02/22/2022   10:40 AM  Fall Risk   Falls in the past year? 0 0 0 0 0  Number falls in past yr: 0 0 0 0 0  Injury with Fall? 0 0 0 0 0  Risk for fall due to : Medication side effect No Fall Risks No Fall Risks Medication side effect No Fall Risks  Follow up Falls prevention discussed;Falls evaluation completed Falls evaluation completed Falls evaluation completed  Falls prevention discussed;Education provided;Falls evaluation completed Falls evaluation completed    MEDICARE RISK AT HOME: Medicare Risk at Home Any stairs in or around the home?: No If so, are there any without handrails?: No Home free of loose throw rugs in walkways, pet beds, electrical cords, etc?: Yes Adequate lighting in your home to reduce risk of falls?: Yes Life alert?: No Use of a cane, walker or w/c?: No Grab bars in the bathroom?: Yes Shower chair or bench in shower?: No Elevated toilet seat or a handicapped toilet?: No  TIMED UP AND GO:  Was the test performed?  No    Cognitive Function:        05/24/2023    4:03 PM 06/11/2022   11:07 AM  6CIT Screen  What Year? 0 points 0 points  What month? 0 points 0 points  What time? 0 points 0 points  Count back from 20 0 points 0 points  Months in reverse 2 points 2 points  Repeat phrase 2 points 0 points  Total Score 4 points 2 points    Immunizations Immunization History  Administered Date(s) Administered   Fluad Quad(high Dose 65+) 02/22/2022   Fluad Trivalent(High Dose 65+) 02/24/2023   Influenza,inj,Quad PF,6+ Mos 04/01/2013, 05/16/2017, 05/02/2018, 02/14/2019, 02/17/2021   Influenza,inj,quad, With Preservative 05/02/2018   Influenza-Unspecified 05/16/2017   PFIZER(Purple Top)SARS-COV-2 Vaccination 08/25/2019, 09/22/2019   Pneumococcal Polysaccharide-23 05/17/2019   Tdap 06/13/2017   Zoster Recombinant(Shingrix) 03/10/2023, 05/16/2023    TDAP status: Up to date  Flu Vaccine status: Up to date  Pneumococcal vaccine status: Due, Education has been provided regarding the importance of this vaccine. Advised may receive this vaccine at local pharmacy or Health Dept. Aware to provide a copy of the vaccination record if obtained from local pharmacy or Health Dept. Verbalized acceptance and understanding.  Covid-19 vaccine status: Information provided on how to obtain vaccines.   Qualifies for Shingles  Vaccine? Yes   Zostavax completed No   Shingrix Completed?: Yes  Screening Tests Health Maintenance  Topic Date Due   Pneumonia Vaccine 59+ Years old (2 of 2 - PCV) 02/19/2021   Colonoscopy  07/21/2022   COVID-19 Vaccine (3 - 2023-24 season) 02/13/2023   Medicare Annual Wellness (AWV)  05/23/2024   DTaP/Tdap/Td (2 - Td or Tdap) 06/14/2027   INFLUENZA VACCINE  Completed   Hepatitis C Screening  Completed   Zoster Vaccines- Shingrix  Completed   HPV VACCINES  Aged Out    Health Maintenance  Health Maintenance Due  Topic Date Due   Pneumonia Vaccine 17+ Years old (2 of 2 - PCV) 02/19/2021   Colonoscopy  07/21/2022   COVID-19 Vaccine (3 - 2023-24 season) 02/13/2023    Colorectal cancer screening: Type of screening: Colonoscopy. Completed 07/21/2017. Repeat every 5 years  Lung Cancer Screening: (Low Dose CT Chest recommended if Age 79-80 years, 20 pack-year currently smoking OR have quit w/in 15years.) does not qualify.   Lung Cancer Screening Referral: no  Additional Screening:  Hepatitis C Screening: does qualify; Completed 06/13/2017  Vision Screening: Recommended annual ophthalmology exams for early detection of glaucoma and other disorders of the eye. Is the patient up to date with their annual eye exam?  Yes  Who is the provider or what is the name of the office in which the patient attends annual eye exams? [facility] If pt is not established with a provider, would they like to be referred to a provider to establish care? No .   Dental Screening: Recommended annual dental exams for proper oral hygiene  Diabetic Foot Exam: n/a  Community Resource Referral / Chronic Care Management: CRR required this visit?  No   CCM required this visit?  No     Plan:     I have personally reviewed and noted the following in the patient's chart:   Medical and social history Use of alcohol, tobacco or illicit drugs  Current medications and supplements including opioid  prescriptions. Patient is not currently taking opioid prescriptions. Functional ability and status Nutritional status Physical activity Advanced directives List of other physicians Hospitalizations, surgeries, and ER visits in previous 12 months Vitals Screenings to include cognitive, depression, and falls Referrals and appointments  In addition, I have reviewed and discussed with patient certain preventive protocols, quality metrics, and best practice recommendations. A written personalized care plan for preventive services as well as general preventive health recommendations were provided to patient.     Barb Merino, LPN   [id number]   After Visit Summary: (Pick Up) Due to this being a telephonic visit, with patients personalized plan was offered to patient and patient has requested to Pick up at office.  Nurse Notes: none

## 2023-05-24 NOTE — Patient Instructions (Signed)
Mr. Schlotter , Thank you for taking time to come for your Medicare Wellness Visit. I appreciate your ongoing commitment to your health goals. Please review the following plan we discussed and let me know if I can assist you in the future.   Referrals/Orders/Follow-Ups/Clinician Recommendations: none  This is a list of the screening recommended for you and due dates:  Health Maintenance  Topic Date Due   Pneumonia Vaccine (2 of 2 - PCV) 02/19/2021   Colon Cancer Screening  07/21/2022   COVID-19 Vaccine (3 - 2023-24 season) 02/13/2023   Medicare Annual Wellness Visit  05/23/2024   DTaP/Tdap/Td vaccine (2 - Td or Tdap) 06/14/2027   Flu Shot  Completed   Hepatitis C Screening  Completed   Zoster (Shingles) Vaccine  Completed   HPV Vaccine  Aged Out    Advanced directives: (ACP Link)Information on Advanced Care Planning can be found at [facility] [place] Advance Health Care Directives Advance Health Care Directives ([email or web address]   Next Medicare Annual Wellness Visit scheduled for next year: Yes  insert Preventive Care attachment Insert FALL PREVENTION attachment if needed

## 2023-06-29 ENCOUNTER — Ambulatory Visit: Payer: Medicare Other | Admitting: Medical

## 2023-06-29 VITALS — BP 124/70 | HR 57 | Temp 97.7°F | Wt 245.6 lb

## 2023-06-29 DIAGNOSIS — R21 Rash and other nonspecific skin eruption: Secondary | ICD-10-CM | POA: Diagnosis not present

## 2023-06-29 DIAGNOSIS — T7840XA Allergy, unspecified, initial encounter: Secondary | ICD-10-CM

## 2023-06-29 DIAGNOSIS — R300 Dysuria: Secondary | ICD-10-CM | POA: Diagnosis not present

## 2023-06-29 LAB — POCT URINALYSIS DIP (PROADVANTAGE DEVICE)
Bilirubin, UA: NEGATIVE
Blood, UA: NEGATIVE
Glucose, UA: NEGATIVE mg/dL
Ketones, POC UA: NEGATIVE mg/dL
Leukocytes, UA: NEGATIVE
Nitrite, UA: NEGATIVE
Protein Ur, POC: NEGATIVE mg/dL
Specific Gravity, Urine: 1.025
Urobilinogen, Ur: NEGATIVE
pH, UA: 6 (ref 5.0–8.0)

## 2023-06-29 MED ORDER — HYDROXYZINE HCL 10 MG PO TABS: 10.0000 mg | ORAL_TABLET | Freq: Three times a day (TID) | ORAL | 0 refills | Status: DC | PRN

## 2023-06-29 NOTE — Progress Notes (Signed)
 Subjective:  Wesley Lawson is a 68 y.o. male who presents for Chief Complaint  Patient presents with   possible bladder infection    Possible bladder infection.  Symptoms- burning, and redness Started a new body wash and has rash on arms and sides and back of thigh.      Here for 2 concerns.  He wanted to recheck the urine.  I saw him back in December for possible bladder infection when he had burning and concern for blood/red on the toilet paper after urinating, urinary frequency and changes in urine.  He ultimately had no infection per culture and symptoms did resolve  Today he feels fine but 2 or 3 days ago he did have a little bit of burning with urination and thought he saw some redness on the toilet paper but no distinct blood.  No odor of the urine, no cloudy urine.  Today no urinary symptoms at all  Of note he does see urology, Dr. Derrick Fling  He also has concern for shingles versus allergic reaction.  He started using a new body wash yesterday and today he has redness and a rash and itching several places on his body including torso, back, abdomen, arms and legs.  No other aggravating or relieving factors.    No other c/o.  Past Medical History:  Diagnosis Date   Abnormal EKG    Allergy    Arthritis    hands, knees   Chest pain    a. normal echo stress test in 2010 with mild to moderate LVH.    Dyslipidemia    History of exercise stress test 08/2016   normal   Hyperlipidemia    Hypertension    Current Outpatient Medications on File Prior to Visit  Medication Sig Dispense Refill   aspirin  EC 81 MG tablet Take 1 tablet (81 mg total) by mouth daily. 90 tablet 3   lisinopril -hydrochlorothiazide  (ZESTORETIC ) 20-25 MG tablet Take 1 tablet by mouth daily. 90 tablet 3   valACYclovir  (VALTREX ) 1000 MG tablet TAKE 2 TABLETS BY MOUTH TWICE DAILY(FOR 1 DAY FOR COLD SORES OR THREE TIMES DAILY FOR 1 WEEK FOR SHINGLES) 30 tablet 1   acetaminophen  (TYLENOL ) 325 MG tablet Take 650 mg  by mouth every 6 (six) hours as needed for moderate pain.      sildenafil  (REVATIO ) 20 MG tablet Take 1 tablet (20 mg total) by mouth daily as needed. 50 tablet 3   No current facility-administered medications on file prior to visit.     The following portions of the patient's history were reviewed and updated as appropriate: allergies, current medications, past family history, past medical history, past social history, past surgical history and problem list.  ROS Otherwise as in subjective above  Objective: BP 124/70   Pulse (!) 57   Temp 97.7 F (36.5 C)   Wt 245 lb 9.6 oz (111.4 kg)   BMI 34.25 kg/m   General appearance: alert, no distress, well developed, well nourished Abdomen nontender, no mass Skin: Faint patches of erythema flat on bilateral arms upper and lower, right anterior abdomen, left mid back, nonspecific    Assessment: Encounter Diagnoses  Name Primary?   Rash Yes   Allergic reaction, initial encounter    Dysuria      Plan: Rash and itching suggest allergic reaction most likely due to the new body wash she has been using.  Discontinue body wash.  Use hydroxyzine  2-3 times a day for the next couple days.  Go  home and use your normal soap you have been using to wash off any soap residue from the body wash.  If not much improved within the next 3 days then let me know.  Reassured no signs of shingles  Dysuria-urinalysis normal today.  We discussed the fact that he was here a month ago for urinary symptoms and has had some recurrent symptoms recently.  He already sees Dr. Derrick Fling with urology.  I recommend doing a follow-up with urology despite the urine being normal today.  We discussed potential causes of urinary changes, particular if any signs of redness or blood in the urine should go back and see urology  Tong was seen today for possible bladder infection.  Diagnoses and all orders for this visit:  Rash  Allergic reaction, initial  encounter  Dysuria -     POCT Urinalysis DIP (Proadvantage Device)  Other orders -     hydrOXYzine  (ATARAX ) 10 MG tablet; Take 1 tablet (10 mg total) by mouth 3 (three) times daily as needed.    Follow up: As needed

## 2023-08-16 DIAGNOSIS — H2513 Age-related nuclear cataract, bilateral: Secondary | ICD-10-CM | POA: Diagnosis not present

## 2023-08-16 DIAGNOSIS — H524 Presbyopia: Secondary | ICD-10-CM | POA: Diagnosis not present

## 2023-09-26 ENCOUNTER — Other Ambulatory Visit: Payer: Self-pay | Admitting: Medical

## 2023-11-28 ENCOUNTER — Telehealth: Payer: Self-pay | Admitting: Medical

## 2023-11-28 MED ORDER — VALACYCLOVIR HCL 1 G PO TABS: ORAL_TABLET | ORAL | 1 refills | Status: DC

## 2023-11-28 NOTE — Telephone Encounter (Signed)
 Copied from CRM [id number]. Topic: Clinical - Medication Refill >> Nov 28, 2023 12:34 PM Fonda T wrote: Medication: valACYclovir  (VALTREX ) 1000 MG tablet   Has the patient contacted their pharmacy? No, per previous attempts, per pharmacy advised patient to contact office (Agent: If no, request that the patient contact the pharmacy for the refill. If patient does not wish to contact the pharmacy document the reason why and proceed with request.) (Agent: If yes, when and what did the pharmacy advise?)  This is the patient's preferred pharmacy:  [facility] [address], [place] - [address] AT [address] [place] [address] Phone: [phone] Fax: [phone]   Is this the correct pharmacy for this prescription? Yes If no, delete pharmacy and type the correct one.   Has the prescription been filled recently? Yes  Is the patient out of the medication? Yes  Has the patient been seen for an appointment in the last year OR does the patient have an upcoming appointment? Yes  Can we respond through MyChart? No  Agent: Please be advised that Rx refills may take up to 3 business days. We ask that you follow-up with your pharmacy.

## 2023-12-15 DIAGNOSIS — M25561 Pain in right knee: Secondary | ICD-10-CM | POA: Diagnosis not present

## 2023-12-15 DIAGNOSIS — M25551 Pain in right hip: Secondary | ICD-10-CM | POA: Diagnosis not present

## 2024-01-18 DIAGNOSIS — N5201 Erectile dysfunction due to arterial insufficiency: Secondary | ICD-10-CM | POA: Diagnosis not present

## 2024-03-05 ENCOUNTER — Ambulatory Visit (INDEPENDENT_AMBULATORY_CARE_PROVIDER_SITE_OTHER): Payer: BC Managed Care – PPO | Admitting: Medical

## 2024-03-05 ENCOUNTER — Encounter: Payer: Self-pay | Admitting: Medical

## 2024-03-05 VITALS — BP 122/78 | HR 66 | Ht 71.0 in | Wt 257.4 lb

## 2024-03-05 DIAGNOSIS — N401 Enlarged prostate with lower urinary tract symptoms: Secondary | ICD-10-CM | POA: Diagnosis not present

## 2024-03-05 DIAGNOSIS — Z7185 Encounter for immunization safety counseling: Secondary | ICD-10-CM

## 2024-03-05 DIAGNOSIS — Z125 Encounter for screening for malignant neoplasm of prostate: Secondary | ICD-10-CM | POA: Diagnosis not present

## 2024-03-05 DIAGNOSIS — Z23 Encounter for immunization: Secondary | ICD-10-CM | POA: Diagnosis not present

## 2024-03-05 DIAGNOSIS — E785 Hyperlipidemia, unspecified: Secondary | ICD-10-CM

## 2024-03-05 DIAGNOSIS — I10 Essential (primary) hypertension: Secondary | ICD-10-CM

## 2024-03-05 DIAGNOSIS — R7303 Prediabetes: Secondary | ICD-10-CM

## 2024-03-05 DIAGNOSIS — M5441 Lumbago with sciatica, right side: Secondary | ICD-10-CM

## 2024-03-05 DIAGNOSIS — Z0000 Encounter for general adult medical examination without abnormal findings: Secondary | ICD-10-CM | POA: Diagnosis not present

## 2024-03-05 DIAGNOSIS — Z1389 Encounter for screening for other disorder: Secondary | ICD-10-CM | POA: Diagnosis not present

## 2024-03-05 DIAGNOSIS — L989 Disorder of the skin and subcutaneous tissue, unspecified: Secondary | ICD-10-CM

## 2024-03-05 DIAGNOSIS — D369 Benign neoplasm, unspecified site: Secondary | ICD-10-CM

## 2024-03-05 DIAGNOSIS — Z1211 Encounter for screening for malignant neoplasm of colon: Secondary | ICD-10-CM

## 2024-03-05 LAB — MICROSCOPIC EXAMINATION

## 2024-03-05 MED ORDER — ASPIRIN 81 MG PO TBEC: 81.0000 mg | DELAYED_RELEASE_TABLET | Freq: Once | ORAL | 3 refills | Status: AC

## 2024-03-05 MED ORDER — SILDENAFIL CITRATE 20 MG PO TABS: 20.0000 mg | ORAL_TABLET | Freq: Every day | ORAL | 3 refills | Status: DC | PRN

## 2024-03-05 MED ORDER — LISINOPRIL-HYDROCHLOROTHIAZIDE 20-25 MG PO TABS: 1.0000 | ORAL_TABLET | Freq: Every day | ORAL | 3 refills | Status: AC

## 2024-03-05 NOTE — Progress Notes (Signed)
 Name: Wesley Lawson   Date of Visit: 03/05/24   Date of last visit with me: 11/28/2023   CHIEF COMPLAINT:  Chief Complaint  Patient presents with   Annual Exam    Fasting cpe, declines flu shot today       HPI:  Discussed the use of AI scribe software for clinical note transcription with the patient, who gave verbal consent to proceed.  Patient Care Team: Reshma Hoey, Alm GORMAN RIGGERS as PCP - General (Family Medicine) Dr. Victory Brand, GI Eye doctor Dentist   History of Present Illness   Wesley Lawson is a 68 year old male with hypertension and hyperlipidemia who presents with back pain.  He has been experiencing back pain, which he suspects may be related to his mattress, as it is at least seven years old. The pain radiates from his foot up to his knee.  Flexibility is less than in past  He is currently taking aspirin  81 mg daily, lisinopril  HCT 20/25 mg daily, and sildenafil . He does not use hydroxyzine  anymore. He has a history of hip surgery and wisdom teeth removal. His last colonoscopy was in 2019, and he is due for a repeat.  He exercises at [employer] two to three times a week, engaging in treadmill activities and weight lifting, which he feels has improved his condition. His diet lacks fruits, although he consumes vegetables. He notes that fruits have become expensive, and he typically buys them himself.  He has received an RSV vaccine recently and believes he may have had a pneumonia shot at [facility], but is unsure about a recent flu shot. He typically receives flu shots annually. No allergies.        Reviewed their medical, surgical, family, social, medication, and allergy history and updated chart as appropriate.  No Known Allergies  Past Medical History:  Diagnosis Date   Abnormal EKG    Allergy    Arthritis    hands, knees   Chest pain    a. normal echo stress test in 2010 with mild to moderate LVH.    Dyslipidemia    History of exercise stress test  08/2016   normal   Hyperlipidemia    Hypertension      Current Outpatient Medications:    acetaminophen  (TYLENOL ) 325 MG tablet, Take 650 mg by mouth every 6 (six) hours as needed for moderate pain. , Disp: , Rfl:    ASPIRIN  LOW DOSE 81 MG tablet, TAKE 1 TABLET(81 MG) BY MOUTH DAILY, Disp: 90 tablet, Rfl: 3   lisinopril -hydrochlorothiazide  (ZESTORETIC ) 20-25 MG tablet, Take 1 tablet by mouth daily., Disp: 90 tablet, Rfl: 3   sildenafil  (REVATIO ) 20 MG tablet, TAKE 1 TABLET(20 MG) BY MOUTH DAILY AS NEEDED, Disp: 50 tablet, Rfl: 3   valACYclovir  (VALTREX ) 1000 MG tablet, TAKE 2 TABLETS BY MOUTH TWICE DAILY(FOR 1 DAY FOR COLD SORES OR THREE TIMES DAILY FOR 1 WEEK FOR SHINGLES), Disp: 30 tablet, Rfl: 1   hydrOXYzine  (ATARAX ) 10 MG tablet, Take 1 tablet (10 mg total) by mouth 3 (three) times daily as needed. (Patient not taking: Reported on 03/05/2024), Disp: 30 tablet, Rfl: 0  Family History  Problem Relation Age of Onset   Diabetes Mother    Stroke Mother    Diabetes Sister    Asthma Daughter    Colon polyps Neg Hx    Esophageal cancer Neg Hx    Rectal cancer Neg Hx    Stomach cancer Neg Hx    Cancer  Neg Hx    Heart disease Neg Hx    Colon cancer Neg Hx    Liver cancer Neg Hx    Pancreatic cancer Neg Hx     Past Surgical History:  Procedure Laterality Date   COLONOSCOPY  07/21/2017   tubular adenoma, Dr. Victory Brand   TOTAL HIP ARTHROPLASTY Right 01/21/2020   Procedure: RIGHT TOTAL HIP ARTHROPLASTY ANTERIOR APPROACH;  Surgeon: Liam Lerner, MD;  Location: [facility];  Service: Orthopedics;  Laterality: Right;   WISDOM TOOTH EXTRACTION       Review of Systems  Constitutional:  Negative for chills, fever, malaise/fatigue and weight loss.  HENT:  Negative for congestion, ear pain, hearing loss, sore throat and tinnitus.   Eyes:  Negative for blurred vision, pain and redness.  Respiratory:  Negative for cough, hemoptysis and shortness of breath.   Cardiovascular:  Negative for  chest pain, palpitations, orthopnea, claudication and leg swelling.  Gastrointestinal:  Negative for abdominal pain, blood in stool, constipation, diarrhea, nausea and vomiting.  Genitourinary:  Negative for dysuria, flank pain, frequency, hematuria and urgency.  Musculoskeletal:  Positive for back pain. Negative for falls, joint pain and myalgias.  Skin:  Negative for itching and rash.  Neurological:  Negative for dizziness, tingling, speech change, weakness and headaches.  Endo/Heme/Allergies:  Negative for polydipsia. Does not bruise/bleed easily.  Psychiatric/Behavioral:  Negative for depression and memory loss. The patient is not nervous/anxious and does not have insomnia.      OBJECTIVE:    BP 122/78   Pulse 66   Ht 5' 11 (1.803 m)   Wt 257 lb 6.4 oz (116.8 kg)   BMI 35.90 kg/m   BP Readings from Last 3 Encounters:  03/05/24 122/78  06/29/23 124/70  05/16/23 122/80    Wt Readings from Last 3 Encounters:  03/05/24 257 lb 6.4 oz (116.8 kg)  06/29/23 245 lb 9.6 oz (111.4 kg)  05/16/23 252 lb (114.3 kg)    Physical Exam   General appearance: alert, no distress, WD/WN, African American male Skin: No new worrisome findings HEENT: normocephalic, conjunctiva/corneas normal, sclerae anicteric, PERRLA, EOMi, nares patent, no discharge or erythema, pharynx normal Oral cavity: MMM, tongue normal, teeth with some decay of molars Neck: supple, no lymphadenopathy, no thyromegaly, no masses, normal ROM, no bruits Chest: non tender, normal shape and expansion Heart: RRR, normal S1, S2, no murmurs Lungs: CTA bilaterally, no wheezes, rhonchi, or rales Abdomen: +bs, soft, non tender, non distended, no masses, no hepatomegaly, no splenomegaly, no bruits Back: decreased flexion to about 90 degrees with pain noted, otherwise non tender, normal ROM, no scoliosis Musculoskeletal: legs nontender, upper extremities non tender, no obvious deformity, normal ROM throughout, lower extremities non  tender, no obvious deformity, normal ROM throughout Extremities: no edema, no cyanosis, no clubbing Pulses: 2+ symmetric, upper and lower extremities, normal cap refill Neurological: alert, oriented x 3, CN2-12 intact, strength normal upper extremities and lower extremities, sensation normal throughout, DTRs 2+ throughout, no cerebellar signs, gait normal Psychiatric: normal affect, behavior normal, pleasant  GU: normal male external genitalia, nontender, no masses, no hernia, no lymphadenopathy Rectal: declines   ASSESSMENT/PLAN:   Encounter Diagnoses  Name Primary?   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 - Recommend updating mattress if older than 7 years. - Advise using a pillow between legs when sleeping on the side or under legs when on the back to maintain spinal alignment. -referral to physical therapy  Hypertension continue lisinopril  HCT 20/25 mg daily.  Hyperlipidemia Hyperlipidemia not at goal, not currently managed with medication. - Order lipid panel. - Recommend dietary modifications to reduce cholesterol intake, including reducing consumption of foods high in cholesterol such as butter, ice cream, egg yolks, cheese, and fatty meats.  Prediabetes Prediabetes noted in previous labs. - Order hemoglobin A1c.     Erectile dysfunction -  continue sildenafil  prn  Skin lesions - referral to dermatology   This visit was a preventative care visit, also known as wellness visit or routine physical.   Topics typically include healthy lifestyle, diet, exercise, preventative care, vaccinations, sick and well care, proper use of emergency dept and after hours care, as well as other concerns.     General Recommendations: Continue to return yearly for your annual wellness and preventative care visits.  This gives us  a chance to discuss healthy lifestyle, exercise, vaccinations, review your chart record, and perform screenings where appropriate.  I recommend you see your eye doctor yearly for routine vision care.  I recommend you see your dentist yearly for routine dental care including hygiene visits twice yearly.   Vaccination  Immunization History  Administered Date(s) Administered   Fluad Quad(high Dose 65+) 02/22/2022   Fluad Trivalent(High Dose 65+) 02/24/2023   Influenza,inj,Quad PF,6+ Mos 04/01/2013, 05/16/2017, 05/02/2018, 02/14/2019, 02/17/2021   Influenza,inj,quad, With Preservative 05/02/2018   Influenza-Unspecified 05/16/2017   PFIZER(Purple Top)SARS-COV-2 Vaccination 08/25/2019, 09/22/2019   Pneumococcal Polysaccharide-23 05/17/2019   RSV,unspecified 02/03/2024   Tdap 06/13/2017   Zoster Recombinant(Shingrix ) 03/10/2023, 05/16/2023    Vaccine recommendations: Flu, pneumococcal 20  Vaccines administered today: Counseled on the influenza virus vaccine.  Vaccine information sheet given.  Influenza vaccine given after consent obtained.   Screening for cancer: Colon cancer screening: Referral back for updated colonoscopy  Prostate Cancer screening: The recommended prostate cancer screening test is a blood test called the prostate-specific antigen (PSA) test. PSA is a protein that is made in the prostate. As you age, your prostate naturally produces more PSA. Abnormally high PSA levels may be caused  by: Prostate cancer. An enlarged prostate that is not caused by cancer (benign prostatic hyperplasia, or BPH). This condition is very common in older men. A prostate gland infection (prostatitis) or urinary tract infection. Certain medicines such as male hormones (like testosterone ) or other medicines that raise testosterone  levels. A rectal exam may be done as part of prostate cancer screening to help provide information about the size of your prostate gland. When a rectal exam is performed, it should be done after the PSA level is drawn to avoid any effect on the results.   Skin cancer screening: Check your skin regularly for new changes, growing lesions, or other lesions of concern Come in for evaluation if you have skin lesions of concern.   Lung cancer screening: If you have a greater than 20 pack year history  of tobacco use, then you may qualify for lung cancer screening with a chest CT scan.   Please call your insurance company to inquire about coverage for this test.   Pancreatic cancer:  no current screening test is available or routinely recommended. (risk factors: smoking, overweight or obese, diabetes, chronic pancreatitis, work exposure - dry cleaning, metal working, 68yo>, M>F, Tree surgeon, family hx/o, hereditary breast, ovarian, melanoma, lynch, peutz-jeghers).  Symptoms: jaundice, dark urine, light color or greasy stools, itchy skin, belly or back pain, weight loss, poor appetite, nause, vomiting, liver enlargement, DVT/blood clots.   We currently don't have screenings for other cancers besides breast, cervical, colon, and lung cancers.  If you have a strong family history of cancer or have other cancer screening concerns, please let me know.  Genetic testing referral is an option for individuals with high cancer risk in the family.  There are some other cancer screenings in development currently.   Bone health: Get at least 150 minutes of aerobic exercise weekly Get  weight bearing exercise at least once weekly Bone density test:  A bone density test is an imaging test that uses a type of X-ray to measure the amount of calcium  and other minerals in your bones. The test may be used to diagnose or screen you for a condition that causes weak or thin bones (osteoporosis), predict your risk for a broken bone (fracture), or determine how well your osteoporosis treatment is working. The bone density test is recommended for females 65 and older, or females or males <65 if certain risk factors such as thyroid  disease, long term use of steroids such as for asthma or rheumatological issues, vitamin D deficiency, estrogen deficiency, family history of osteoporosis, self or family history of fragility fracture in first degree relative.    Heart health: Get at least 150 minutes of aerobic exercise weekly Limit alcohol It is important to maintain a healthy blood pressure and healthy cholesterol numbers  Heart disease screening: Screening for heart disease includes screening for blood pressure, fasting lipids, glucose/diabetes screening, BMI height to weight ratio, reviewed of smoking status, physical activity, and diet.    Goals include blood pressure 120/80 or less, maintaining a healthy lipid/cholesterol profile, preventing diabetes or keeping diabetes numbers under good control, not smoking or using tobacco products, exercising most days per week or at least 150 minutes per week of exercise, and eating healthy variety of fruits and vegetables, healthy oils, and avoiding unhealthy food choices like fried food, fast food, high sugar and high cholesterol foods.    Other tests may possibly include EKG test, CT coronary calcium  score, echocardiogram, exercise treadmill stress test.     Heart Disease Testing completed: EKG 2024 unchanged Nuclear stress test 08/2016   Vascular disease screening: For higher risk individuals including smokers, diabetics, patients with known  heart disease or high blood pressure, kidney disease, and others, screening for vascular disease or atherosclerosis of the arteries is available.  Examples may include carotid ultrasound, abdominal aortic ultrasound, ABI blood flow screening in the legs, thoracic aorta screening.   Medical care options: I recommend you continue to seek care here first for routine care.  We try really hard to have available appointments Monday through Friday daytime hours for sick visits, acute visits, and physicals.  Urgent care should be used for after hours and weekends for significant issues that cannot wait till the next day.  The emergency department should be used for significant potentially life-threatening emergencies.  The emergency department  is expensive, can often have long wait times for less significant concerns, so try to utilize primary care, urgent care, or telemedicine when possible to avoid unnecessary trips to the emergency department.  Virtual visits and telemedicine have been introduced since the pandemic started in 2020, and can be convenient ways to receive medical care.  We offer virtual appointments as well to assist you in a variety of options to seek medical care.   Legal  Take the time to do a last will and testament, Advanced Directives including Health Care Power of Attorney and Living Will documents.  Don't leave your family with burdens that can be handled ahead of time.   Advanced Directives: I recommend you consider completing a Health Care Power of Attorney and Living Will.   These documents respect your wishes and help alleviate burdens on your loved ones if you were to become terminally ill or be in a position to need those documents enforced.    You can complete Advanced Directives yourself, have them notarized, then have copies made for our office, for you and for anybody you feel should have them in safe keeping.  Or, you can have an attorney prepare these documents.   If you  haven't updated your Last Will and Testament in a while, it may be worthwhile having an attorney prepare these documents together and save on some costs.       Bryon was seen today for annual exam.  Diagnoses and all orders for this visit:  Encounter for health maintenance examination in adult -     Ambulatory referral to Gastroenterology -     CBC -     Comprehensive metabolic panel with GFR -     Lipid panel -     PSA -     Hemoglobin A1c -     Urinalysis, Routine w reflex microscopic  Benign prostatic hyperplasia with lower urinary tract symptoms, symptom details unspecified -     PSA  Essential hypertension  Hyperlipidemia, unspecified hyperlipidemia type -     Lipid panel  Prediabetes -     Hemoglobin A1c  Tubular adenoma  Screen for colon cancer -     Ambulatory referral to Gastroenterology  Screening for prostate cancer -     PSA  Screening for hematuria or proteinuria -     Urinalysis, Routine w reflex microscopic  Vaccine counseling  Right-sided low back pain with right-sided sciatica, unspecified chronicity -     Ambulatory referral to Physical Therapy  Need for influenza vaccination  Skin lesion -     Ambulatory referral to Dermatology    I recommend follow up yearly for a routine physical.   [facility]

## 2024-03-05 NOTE — Patient Instructions (Signed)
 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 - Recommend updating mattress if older than 7 years. - Advise using a pillow between legs when sleeping on the side or under legs when on the back to maintain spinal alignment. -referral to physical therapy  Hypertension continue lisinopril  HCT 20/25 mg daily.  Hyperlipidemia Hyperlipidemia not at goal, not currently managed with medication. - Order lipid panel. - Recommend dietary modifications to reduce cholesterol intake, including reducing consumption of foods high in cholesterol such as butter, ice cream, egg yolks, cheese, and fatty meats.  Prediabetes Prediabetes noted in previous labs. - Order hemoglobin A1c.     Erectile dysfunction - continue sildenafil  prn  Skin lesions - referral to dermatology   This visit was a preventative care visit, also known as wellness visit or routine physical.   Topics typically include healthy lifestyle, diet, exercise, preventative care, vaccinations, sick and well care, proper use of emergency dept and after hours care, as well as other concerns.     General Recommendations: Continue to return yearly for your annual wellness and preventative care visits.  This gives us  a chance to discuss healthy lifestyle, exercise, vaccinations, review your chart record, and perform screenings where appropriate.  I recommend you see your eye doctor yearly for routine vision care.  I recommend you see your dentist yearly for routine dental care including hygiene visits twice yearly.   Vaccination  Immunization History  Administered Date(s) Administered   Fluad Quad(high Dose 65+) 02/22/2022   Fluad Trivalent(High Dose 65+) 02/24/2023   Influenza,inj,Quad PF,6+ Mos 04/01/2013, 05/16/2017, 05/02/2018, 02/14/2019, 02/17/2021   Influenza,inj,quad, With Preservative 05/02/2018   Influenza-Unspecified  05/16/2017   PFIZER(Purple Top)SARS-COV-2 Vaccination 08/25/2019, 09/22/2019   Pneumococcal Polysaccharide-23 05/17/2019   RSV,unspecified 02/03/2024   Tdap 06/13/2017   Zoster Recombinant(Shingrix ) 03/10/2023, 05/16/2023    Vaccine recommendations: Flu, pneumococcal 20  Vaccines administered today: Counseled on the influenza virus vaccine.  Vaccine information sheet given.  Influenza vaccine given after consent obtained.   Screening for cancer: Colon cancer screening: Referral back for updated colonoscopy  Prostate Cancer screening: The recommended prostate cancer screening test is a blood test called the prostate-specific antigen (PSA) test. PSA is a protein that is made in the prostate. As you age, your prostate naturally produces more PSA. Abnormally high PSA levels may be caused by: Prostate cancer. An enlarged prostate that is not caused by cancer (benign prostatic hyperplasia, or BPH). This condition is very common in older men. A prostate gland infection (prostatitis) or urinary tract infection. Certain medicines such as male hormones (like testosterone ) or other medicines that raise testosterone  levels. A rectal exam may be done as part of prostate cancer screening to help provide information about the size of your prostate gland. When a rectal exam is performed, it should be done after the PSA level is drawn to avoid any effect on the results.   Skin cancer screening: Check your skin regularly for new changes, growing lesions, or other lesions of concern Come in for evaluation if you have skin lesions of concern.   Lung cancer screening: If you have a greater than 20 pack year history of tobacco use, then you may qualify for lung cancer screening with a chest CT scan.   Please call your insurance company to inquire about coverage for this test.   Pancreatic cancer:  no current screening test is available or routinely recommended. (risk  factors: smoking, overweight or  obese, diabetes, chronic pancreatitis, work exposure - dry cleaning, metal working, 68yo>, M>F, Tree surgeon, family hx/o, hereditary breast, ovarian, melanoma, lynch, peutz-jeghers).  Symptoms: jaundice, dark urine, light color or greasy stools, itchy skin, belly or back pain, weight loss, poor appetite, nause, vomiting, liver enlargement, DVT/blood clots.   We currently don't have screenings for other cancers besides breast, cervical, colon, and lung cancers.  If you have a strong family history of cancer or have other cancer screening concerns, please let me know.  Genetic testing referral is an option for individuals with high cancer risk in the family.  There are some other cancer screenings in development currently.   Bone health: Get at least 150 minutes of aerobic exercise weekly Get weight bearing exercise at least once weekly Bone density test:  A bone density test is an imaging test that uses a type of X-ray to measure the amount of calcium  and other minerals in your bones. The test may be used to diagnose or screen you for a condition that causes weak or thin bones (osteoporosis), predict your risk for a broken bone (fracture), or determine how well your osteoporosis treatment is working. The bone density test is recommended for females 65 and older, or females or males <65 if certain risk factors such as thyroid  disease, long term use of steroids such as for asthma or rheumatological issues, vitamin D deficiency, estrogen deficiency, family history of osteoporosis, self or family history of fragility fracture in first degree relative.    Heart health: Get at least 150 minutes of aerobic exercise weekly Limit alcohol It is important to maintain a healthy blood pressure and healthy cholesterol numbers  Heart disease screening: Screening for heart disease includes screening for blood pressure, fasting lipids, glucose/diabetes screening, BMI height to weight ratio, reviewed of  smoking status, physical activity, and diet.    Goals include blood pressure 120/80 or less, maintaining a healthy lipid/cholesterol profile, preventing diabetes or keeping diabetes numbers under good control, not smoking or using tobacco products, exercising most days per week or at least 150 minutes per week of exercise, and eating healthy variety of fruits and vegetables, healthy oils, and avoiding unhealthy food choices like fried food, fast food, high sugar and high cholesterol foods.    Other tests may possibly include EKG test, CT coronary calcium  score, echocardiogram, exercise treadmill stress test.     Heart Disease Testing completed: EKG 2024 unchanged Nuclear stress test 08/2016   Vascular disease screening: For higher risk individuals including smokers, diabetics, patients with known heart disease or high blood pressure, kidney disease, and others, screening for vascular disease or atherosclerosis of the arteries is available.  Examples may include carotid ultrasound, abdominal aortic ultrasound, ABI blood flow screening in the legs, thoracic aorta screening.   Medical care options: I recommend you continue to seek care here first for routine care.  We try really hard to have available appointments Monday through Friday daytime hours for sick visits, acute visits, and physicals.  Urgent care should be used for after hours and weekends for significant issues that cannot wait till the next day.  The emergency department should be used for significant potentially life-threatening emergencies.  The emergency department is expensive, can often have long wait times for less significant concerns, so try to utilize primary care, urgent care, or telemedicine when possible to avoid unnecessary trips to the emergency department.  Virtual visits and telemedicine have been introduced since the pandemic started in  2020, and can be convenient ways to receive medical care.  We offer virtual appointments  as well to assist you in a variety of options to seek medical care.   Legal  Take the time to do a last will and testament, Advanced Directives including Health Care Power of Attorney and Living Will documents.  Don't leave your family with burdens that can be handled ahead of time.   Advanced Directives: I recommend you consider completing a Health Care Power of Attorney and Living Will.   These documents respect your wishes and help alleviate burdens on your loved ones if you were to become terminally ill or be in a position to need those documents enforced.    You can complete Advanced Directives yourself, have them notarized, then have copies made for our office, for you and for anybody you feel should have them in safe keeping.  Or, you can have an attorney prepare these documents.   If you haven't updated your Last Will and Testament in a while, it may be worthwhile having an attorney prepare these documents together and save on some costs.

## 2024-03-06 ENCOUNTER — Other Ambulatory Visit: Payer: Self-pay | Admitting: Medical

## 2024-03-06 ENCOUNTER — Other Ambulatory Visit: Payer: Self-pay | Admitting: Internal Medicine

## 2024-03-06 ENCOUNTER — Ambulatory Visit: Payer: Self-pay | Admitting: Medical

## 2024-03-06 DIAGNOSIS — R7303 Prediabetes: Secondary | ICD-10-CM

## 2024-03-06 LAB — URINALYSIS, ROUTINE W REFLEX MICROSCOPIC
Bilirubin, UA: NEGATIVE
Glucose, UA: NEGATIVE
Ketones, UA: NEGATIVE
Nitrite, UA: NEGATIVE
RBC, UA: NEGATIVE
Specific Gravity, UA: 1.026 (ref 1.005–1.030)
Urobilinogen, Ur: 0.2 mg/dL (ref 0.2–1.0)
pH, UA: 5.5 (ref 5.0–7.5)

## 2024-03-06 LAB — MICROSCOPIC EXAMINATION
Casts: NONE SEEN
RBC, Urine: NONE SEEN /HPF (ref 0–2)
Renal Epithel, UA: NONE SEEN /LPF

## 2024-03-06 LAB — LIPID PANEL
Chol/HDL Ratio: 5.9 ratio — ABNORMAL HIGH (ref 0.0–5.0)
Cholesterol, Total: 226 mg/dL — ABNORMAL HIGH (ref 100–199)
HDL: 38 mg/dL — ABNORMAL LOW (ref >39)
LDL Chol Calc (NIH): 147 mg/dL — ABNORMAL HIGH (ref 0–99)
Triglycerides: 225 mg/dL — ABNORMAL HIGH (ref 0–149)
VLDL Cholesterol Cal: 41 mg/dL — ABNORMAL HIGH (ref 5–40)

## 2024-03-06 LAB — COMPREHENSIVE METABOLIC PANEL WITH GFR
ALT: 52 IU/L — ABNORMAL HIGH (ref 0–44)
AST: 37 IU/L (ref 0–40)
Albumin: 4.9 g/dL (ref 3.9–4.9)
Alkaline Phosphatase: 82 IU/L (ref 47–123)
BUN/Creatinine Ratio: 11 (ref 10–24)
BUN: 13 mg/dL (ref 8–27)
Bilirubin Total: 1.1 mg/dL (ref 0.0–1.2)
CO2: 21 mmol/L (ref 20–29)
Calcium: 9.9 mg/dL (ref 8.6–10.2)
Chloride: 100 mmol/L (ref 96–106)
Creatinine, Ser: 1.23 mg/dL (ref 0.76–1.27)
Globulin, Total: 2.1 g/dL (ref 1.5–4.5)
Glucose: 106 mg/dL — ABNORMAL HIGH (ref 70–99)
Potassium: 3.9 mmol/L (ref 3.5–5.2)
Sodium: 138 mmol/L (ref 134–144)
Total Protein: 7 g/dL (ref 6.0–8.5)
eGFR: 64 mL/min/1.73 (ref >59)

## 2024-03-06 LAB — PSA: Prostate Specific Ag, Serum: 1.3 ng/mL (ref 0.0–4.0)

## 2024-03-06 LAB — CBC
Hematocrit: 47.6 % (ref 37.5–51.0)
Hemoglobin: 15.3 g/dL (ref 13.0–17.7)
MCH: 29.5 pg (ref 26.6–33.0)
MCHC: 32.1 g/dL (ref 31.5–35.7)
MCV: 92 fL (ref 79–97)
Platelets: 186 x10E3/uL (ref 150–450)
RBC: 5.18 x10E6/uL (ref 4.14–5.80)
RDW: 13.6 % (ref 11.6–15.4)
WBC: 5 x10E3/uL (ref 3.4–10.8)

## 2024-03-06 LAB — HEMOGLOBIN A1C
Est. average glucose Bld gHb Est-mCnc: 123 mg/dL
Hgb A1c MFr Bld: 5.9 % — ABNORMAL HIGH (ref 4.8–5.6)

## 2024-03-06 MED ORDER — ROSUVASTATIN CALCIUM 20 MG PO TABS: 20.0000 mg | ORAL_TABLET | Freq: Every day | ORAL | 3 refills | Status: DC

## 2024-03-06 NOTE — Progress Notes (Signed)
 Results through MyChart.  Schedule 54-month fasting med check

## 2024-03-26 DIAGNOSIS — Z833 Family history of diabetes mellitus: Secondary | ICD-10-CM | POA: Diagnosis not present

## 2024-03-26 DIAGNOSIS — Z96649 Presence of unspecified artificial hip joint: Secondary | ICD-10-CM | POA: Diagnosis not present

## 2024-03-26 DIAGNOSIS — M1990 Unspecified osteoarthritis, unspecified site: Secondary | ICD-10-CM | POA: Diagnosis not present

## 2024-03-26 DIAGNOSIS — Z87891 Personal history of nicotine dependence: Secondary | ICD-10-CM | POA: Diagnosis not present

## 2024-03-26 DIAGNOSIS — I10 Essential (primary) hypertension: Secondary | ICD-10-CM | POA: Diagnosis not present

## 2024-03-26 DIAGNOSIS — Z6835 Body mass index (BMI) 35.0-35.9, adult: Secondary | ICD-10-CM | POA: Diagnosis not present

## 2024-03-26 DIAGNOSIS — R7303 Prediabetes: Secondary | ICD-10-CM | POA: Diagnosis not present

## 2024-04-04 DIAGNOSIS — M5459 Other low back pain: Secondary | ICD-10-CM | POA: Diagnosis not present

## 2024-04-12 DIAGNOSIS — M5459 Other low back pain: Secondary | ICD-10-CM | POA: Diagnosis not present

## 2024-05-22 ENCOUNTER — Ambulatory Visit: Payer: Self-pay

## 2024-05-22 NOTE — Telephone Encounter (Signed)
 FYI Only or Action Required?: FYI only for provider: appointment scheduled on 05/23/2024 at 11:30 AM.  Patient was last seen in primary care on 03/05/2024 by Bulah Alm RAMAN, PA-C.  Called Nurse Triage reporting Skin Problem.  Symptoms began several days ago.  Interventions attempted: Rest, hydration, or home remedies.  Symptoms are: unchanged.  Triage Disposition: See PCP When Office is Open (Within 3 Days)  Patient/caregiver understands and will follow disposition?: Yes  Copied from CRM [id number]. Topic: Clinical - Red Word Triage >> May 22, 2024  4:18 PM Antwanette L wrote: Red Word that prompted transfer to Nurse Triage: The pt is experiencing left sided chest discomfort that feels like a knot, along w/ soreness when moving left arm. Symptoms have been present for 3-4 days Reason for Disposition  [1] Small swelling or lump AND [2] unexplained AND [3] present > 1 week  Answer Assessment - Initial Assessment Questions Reports an area of swelling to left pectoral area. Patient states symptoms have been going on for three to four days.   1. APPEARANCE of SWELLING: What does it look like?     Area around the nipple of patient's pec 2. SIZE: How large is the swelling? (e.g., inches, cm; or compare to size of pinhead, tip of pen, eraser, coin, pea, grape, ping pong ball)      Area is about an inch wide 3. LOCATION: Where is the swelling located?     Left pectoral area 4. ONSET: When did the swelling start?     Started 3-4 days 5. COLOR: What color is it? Is there more than one color?     no 6. PAIN: Is there any pain? If Yes, ask: How bad is the pain? (Scale 1-10; or mild, moderate, severe)       mild 7. ITCH: Does it itch? If Yes, ask: How bad is the itch?      Yes-mild 8. CAUSE: What do you think caused the swelling?     unsure 9 OTHER SYMPTOMS: Do you have any other symptoms? (e.g., fever)     no  Protocols used: Skin Lump or Localized  Swelling-A-AH

## 2024-05-23 ENCOUNTER — Encounter: Payer: Self-pay | Admitting: Family Medicine

## 2024-05-23 ENCOUNTER — Ambulatory Visit (INDEPENDENT_AMBULATORY_CARE_PROVIDER_SITE_OTHER): Admitting: Family Medicine

## 2024-05-23 VITALS — BP 128/82 | HR 63 | Wt 254.8 lb

## 2024-05-23 DIAGNOSIS — R1084 Generalized abdominal pain: Secondary | ICD-10-CM

## 2024-05-23 DIAGNOSIS — K5900 Constipation, unspecified: Secondary | ICD-10-CM | POA: Diagnosis not present

## 2024-05-23 MED ORDER — POLYETHYLENE GLYCOL 3350 17 GM/SCOOP PO POWD: 17.0000 g | Freq: Two times a day (BID) | ORAL | 1 refills | Status: AC | PRN

## 2024-05-23 NOTE — Progress Notes (Signed)
 Name: Wesley Lawson   Date of Visit: 05/23/24   Date of last visit with me: Visit date not found   CHIEF COMPLAINT:  Chief Complaint  Patient presents with   Acute Visit    Lump on left side of abdomin, bloated, itchy, congestion, feel off, has not had a normal bowel movement, been taking laxative. He wonders if he has a parasite. Wanting something to cleanse system out.        HPI:  Discussed the use of AI scribe software for clinical note transcription with the patient, who gave verbal consent to proceed.  History of Present Illness   Wesley Lawson is a 68 year old male who presents with abdominal discomfort and constipation.  He experiences a sensation of a 'loose bulge' in his abdomen, initially thought to be an accumulation of waste. He feels better after using the bathroom but sometimes perceives something moving, describing it as a muscle pull. He has been taking laxatives for the past couple of weeks to alleviate his symptoms.  His water  intake is poor, often going a whole day without drinking much water , although he has been trying to increase his intake recently. He consumes juice but acknowledges not reaching the recommended sixty ounces of water  daily. He sometimes experiences abdominal pain, which is mostly generalized but has recently been more pronounced on the left side. He also reports a sensation of incomplete evacuation after bowel movements.  He works in a [employer] and notes that his diet includes a lot of heavy foods, which he believes may contribute to his symptoms. He reports that his diet includes a lot of heavy foods and that his water  intake is low.         OBJECTIVE:       05/24/2023    4:02 PM  Depression screen PHQ 2/9  Decreased Interest 0  Down, Depressed, Hopeless 0  PHQ - 2 Score 0  Altered sleeping 1  Tired, decreased energy 0  Change in appetite 0  Feeling bad or failure about yourself  0  Trouble concentrating 0  Moving slowly or  fidgety/restless 0  Suicidal thoughts 0  PHQ-9 Score 1   Difficult doing work/chores Not difficult at all     Data saved with a previous flowsheet row definition     BP Readings from Last 3 Encounters:  05/23/24 128/82  03/05/24 122/78  06/29/23 124/70    BP 128/82   Pulse 63   Wt 254 lb 12.8 oz (115.6 kg)   SpO2 98%   BMI 35.54 kg/m    Physical Exam          Physical Exam Constitutional:      Appearance: Normal appearance.  Abdominal:     General: Abdomen is flat. There is no distension.     Palpations: Abdomen is soft.     Tenderness: There is no abdominal tenderness.  Neurological:     General: No focal deficit present.     Mental Status: He is alert and oriented to person, place, and time. Mental status is at baseline.     ASSESSMENT/PLAN:   Assessment & Plan Generalized abdominal pain  Constipation, unspecified constipation type    Assessment and Plan    Constipation Chronic constipation with abdominal pain and incomplete evacuation due to low fiber and water  intake. No parasitic infection. Symptoms suggest hard stool and partial obstruction. - Prescribed Miralax twice daily for three days, then once daily. - Advised increasing water  intake  to at least 60 ounces daily. - Educated on fiber and water 's role in managing constipation. - Discussed potential switch to Metamucil after habit change.  Generalized abdominal pain Abdominal pain likely secondary to constipation, more pronounced on the left side. - Monitor symptoms with current constipation treatment. - Advised follow-up if symptoms do not improve.       Total time spent on the date of the encounter was 31 minutes, which included reviewing the patients chart, performing a history and physical exam, ordering and reviewing studies, coordinating care, and counseling the patient regarding diagnosis and treatment options. The time spent was medically necessary and supports billing based on total  time.   Makani Seckman A. Vita MD [facility]

## 2024-05-31 ENCOUNTER — Ambulatory Visit

## 2024-06-05 ENCOUNTER — Ambulatory Visit: Payer: Self-pay | Admitting: Medical

## 2024-06-05 VITALS — BP 112/68 | HR 59 | Wt 255.8 lb

## 2024-06-05 DIAGNOSIS — N401 Enlarged prostate with lower urinary tract symptoms: Secondary | ICD-10-CM | POA: Diagnosis not present

## 2024-06-05 DIAGNOSIS — E785 Hyperlipidemia, unspecified: Secondary | ICD-10-CM | POA: Diagnosis not present

## 2024-06-05 DIAGNOSIS — I10 Essential (primary) hypertension: Secondary | ICD-10-CM

## 2024-06-05 DIAGNOSIS — N529 Male erectile dysfunction, unspecified: Secondary | ICD-10-CM | POA: Diagnosis not present

## 2024-06-05 MED ORDER — ROSUVASTATIN CALCIUM 20 MG PO TABS: 20.0000 mg | ORAL_TABLET | Freq: Every day | ORAL | 2 refills | Status: AC

## 2024-06-05 NOTE — Progress Notes (Signed)
 "  Name: Wesley Lawson   Date of Visit: 06/05/2024   Date of last visit with me: 03/05/2024   CHIEF COMPLAINT:  Chief Complaint  Patient presents with   Follow-up    3 month follow-up       HPI:  Discussed the use of AI scribe software for clinical note transcription with the patient, who gave verbal consent to proceed.  History of Present Illness  CLERENCE GUBSER is a 68 year old male with hyperlipidemia and prediabetes who presents for follow-up on his cholesterol management and weight loss.  In September, his liver test was slightly elevated, his blood sugar was high, and his cholesterol was too high. He was advised to resume Crestor  for cholesterol management and was referred to gastroenterology for a repeat colonoscopy and dermatology for skin concerns. He has not yet seen the gastroenterologist or dermatologist. A voicemail was left for him by the gastroenterology office on December 12th to schedule the colonoscopy.  His current medications include lisinopril  HCT for blood pressure, rosuvastatin  (Crestor ) 20 mg for cholesterol, sildenafil  for erectile dysfunction, and Miralax . He does not recall picking up the Crestor  prescription, which was sent in September, and mentions a medication being too expensive, possibly the Crestor .  He has been attempting to increase his water  intake and use a fiber supplement like Metamucil to aid his digestive system. He notes that reducing beer and soda intake previously helped him lose weight, but he has since resumed these habits. He has a photographer.  He has skin tags on his arms but has not had them removed due to insurance not covering the procedure unless they are irritated or bleeding. Noting a previous concern on his face that seems to have resolved.  He would like to try something for ED.  In the past Viagra  and cialis  didn't help that much.    Objective: BP 112/68   Pulse (!) 59   Wt 255 lb 12.8 oz (116 kg)   SpO2 96%   BMI  35.68 kg/m   Gen: wd, wn, nad   Results Labs LFTs (02/2024): Mildly elevated liver enzyme Glucose (02/2024): Elevated Lipid panel (02/2024): Elevated  Diagnostic Cardiac stress test (2018): Normal   Assessment and Plan Encounter Diagnoses  Name Primary?   Hyperlipidemia, unspecified hyperlipidemia type Yes   Essential hypertension    Morbid obesity (HCC)    Erectile dysfunction, unspecified erectile dysfunction type    Low testosterone     Benign prostatic hyperplasia with lower urinary tract symptoms, symptom details unspecified     Erectile dysfunction Chronic condition with previous trials of Cialis  and sildenafil  not effective. Discussed vacuum erection device and potential urology referral for further management. - Prescribed vacuum erection device. - Discussed potential referral to urology for further management options. -Discussed his risk factors including high cholesterol, high blood pressure  Hyperlipidemia Not taking rosuvastatin  due to cost possibly. Discussed alternatives if unaffordable. - Resent prescription for rosuvastatin  20 mg. - Advised to contact pharmacy for cost-effective options. - Will consider alternative statins if rosuvastatin  remains unaffordable.  Hypertension Managed with lisinopril  HCT 20/25mg  daily - Continue lisinopril  HCT.  Prediabetes Plan to recheck blood sugar levels. - Will recheck blood sugar levels at next lab visit.  Morbid obesity associated with hypertension Discussed lifestyle modifications and potential weight management medications. - Encouraged exercise 4-6 days a week. - Advised dietary modifications to reduce calorie intake. - Instructed to check insurance coverage for Contrave or Qsymia.  Constipation Improvement with increased water   and fiber intake. - Continue current regimen of increased water  and fiber intake.  General Health Maintenance Up to date on tetanus and shingles vaccinations.  - Counseled on  pneumonia vaccine, Prevnar 20.  He will return later date for this  Saliou was seen today for follow-up.  Diagnoses and all orders for this visit:  Hyperlipidemia, unspecified hyperlipidemia type  Essential hypertension  Morbid obesity (HCC)  Erectile dysfunction, unspecified erectile dysfunction type  Low testosterone   Benign prostatic hyperplasia with lower urinary tract symptoms, symptom details unspecified  Other orders -     rosuvastatin  (CRESTOR ) 20 MG tablet; Take 1 tablet (20 mg total) by mouth daily.    F/u 16mo "

## 2024-06-05 NOTE — Patient Instructions (Addendum)
 Call your insurance to see if they cover either Qsymia or Contrave weight management medications   [facility]- call to schedule [address] [place] [address] [phone]   [facility]- call to schedule [address], [place], [place] [address] Phone: [phone]

## 2024-06-05 NOTE — Progress Notes (Signed)
 Wrote information in AVS  to call to schedule for referrals

## 2024-07-09 ENCOUNTER — Other Ambulatory Visit: Payer: Self-pay | Admitting: Medical

## 2024-07-09 ENCOUNTER — Telehealth: Payer: Self-pay | Admitting: Pharmacy Technician

## 2024-07-09 ENCOUNTER — Other Ambulatory Visit (HOSPITAL_COMMUNITY): Payer: Self-pay

## 2024-07-09 MED ORDER — TADALAFIL 20 MG PO TABS: 20.0000 mg | ORAL_TABLET | Freq: Every day | ORAL | 0 refills | Status: AC | PRN

## 2024-07-09 NOTE — Telephone Encounter (Signed)
 Pharmacy Patient Advocate Encounter   Received notification from [facility] that prior authorization for Sildenafil  20 mg (Revatio ) is required/requested.   Insurance verification completed.   The patient is insured through [facility].   **Sildenafil  20 mg (often branded as Revatio ) is FDA-approved only for treating pulmonary arterial hypertension (PAH). It is not approved for erectile dysfunction (ED). While it is the same active ingredient as Viagra , the approved doses for ED are 25 mg, 50 mg, or 100 mg**  There were no chart notes indicating the patient has PAH

## 2025-03-11 ENCOUNTER — Encounter: Payer: Self-pay | Admitting: Medical
